# Patient Record
Sex: Female | Born: 1975
Health system: Southern US, Community
[De-identification: ages and names within clinical notes are randomized; demographics above are authoritative.]

## PROBLEM LIST (undated history)

## (undated) DIAGNOSIS — G971 Other reaction to spinal and lumbar puncture: Secondary | ICD-10-CM

## (undated) DIAGNOSIS — M549 Dorsalgia, unspecified: Secondary | ICD-10-CM

## (undated) DIAGNOSIS — R002 Palpitations: Secondary | ICD-10-CM

## (undated) DIAGNOSIS — Z9889 Other specified postprocedural states: Secondary | ICD-10-CM

## (undated) DIAGNOSIS — K219 Gastro-esophageal reflux disease without esophagitis: Secondary | ICD-10-CM

## (undated) DIAGNOSIS — E119 Type 2 diabetes mellitus without complications: Secondary | ICD-10-CM

## (undated) DIAGNOSIS — N200 Calculus of kidney: Secondary | ICD-10-CM

## (undated) DIAGNOSIS — R112 Nausea with vomiting, unspecified: Secondary | ICD-10-CM

## (undated) HISTORY — PX: OTHER SURGICAL HISTORY: SHX169

## (undated) HISTORY — PX: WISDOM TOOTH EXTRACTION: SHX21

## (undated) HISTORY — DX: Gastro-esophageal reflux disease without esophagitis: K21.9

## (undated) HISTORY — DX: Palpitations: R00.2

## (undated) HISTORY — DX: Type 2 diabetes mellitus without complications: E11.9

---

## 1997-07-31 ENCOUNTER — Emergency Department (HOSPITAL_COMMUNITY): Admission: EM | Admit: 1997-07-31 | Discharge: 1997-07-31 | Payer: Self-pay | Admitting: *Deleted

## 1999-04-22 ENCOUNTER — Emergency Department (HOSPITAL_COMMUNITY): Admission: EM | Admit: 1999-04-22 | Discharge: 1999-04-22 | Payer: Self-pay | Admitting: Emergency Medicine

## 1999-04-22 ENCOUNTER — Encounter: Payer: Self-pay | Admitting: Internal Medicine

## 2000-02-15 ENCOUNTER — Ambulatory Visit (HOSPITAL_COMMUNITY): Admission: RE | Admit: 2000-02-15 | Discharge: 2000-02-15 | Payer: Self-pay | Admitting: Obstetrics and Gynecology

## 2000-02-15 ENCOUNTER — Encounter: Payer: Self-pay | Admitting: Obstetrics and Gynecology

## 2000-03-16 ENCOUNTER — Emergency Department (HOSPITAL_COMMUNITY): Admission: EM | Admit: 2000-03-16 | Discharge: 2000-03-16 | Payer: Self-pay

## 2000-04-26 ENCOUNTER — Emergency Department (HOSPITAL_COMMUNITY): Admission: EM | Admit: 2000-04-26 | Discharge: 2000-04-27 | Payer: Self-pay

## 2000-04-26 ENCOUNTER — Encounter: Payer: Self-pay | Admitting: *Deleted

## 2001-02-09 ENCOUNTER — Ambulatory Visit (HOSPITAL_COMMUNITY): Admission: RE | Admit: 2001-02-09 | Discharge: 2001-02-09 | Payer: Self-pay | Admitting: Obstetrics and Gynecology

## 2001-02-09 ENCOUNTER — Encounter: Payer: Self-pay | Admitting: Obstetrics and Gynecology

## 2001-03-21 ENCOUNTER — Ambulatory Visit (HOSPITAL_COMMUNITY): Admission: RE | Admit: 2001-03-21 | Discharge: 2001-03-21 | Payer: Self-pay | Admitting: Family Medicine

## 2001-03-21 ENCOUNTER — Encounter: Payer: Self-pay | Admitting: Family Medicine

## 2001-04-21 ENCOUNTER — Ambulatory Visit (HOSPITAL_COMMUNITY): Admission: RE | Admit: 2001-04-21 | Discharge: 2001-04-21 | Payer: Self-pay | Admitting: Gastroenterology

## 2001-04-21 ENCOUNTER — Encounter: Payer: Self-pay | Admitting: Gastroenterology

## 2001-06-02 ENCOUNTER — Ambulatory Visit (HOSPITAL_COMMUNITY): Admission: RE | Admit: 2001-06-02 | Discharge: 2001-06-02 | Payer: Self-pay | Admitting: Obstetrics and Gynecology

## 2001-06-17 ENCOUNTER — Other Ambulatory Visit: Admission: RE | Admit: 2001-06-17 | Discharge: 2001-06-17 | Payer: Self-pay | Admitting: Obstetrics and Gynecology

## 2002-07-14 ENCOUNTER — Other Ambulatory Visit: Admission: RE | Admit: 2002-07-14 | Discharge: 2002-07-14 | Payer: Self-pay | Admitting: Obstetrics and Gynecology

## 2003-10-05 ENCOUNTER — Other Ambulatory Visit: Admission: RE | Admit: 2003-10-05 | Discharge: 2003-10-05 | Payer: Self-pay | Admitting: Obstetrics and Gynecology

## 2003-12-16 ENCOUNTER — Ambulatory Visit (HOSPITAL_COMMUNITY): Admission: RE | Admit: 2003-12-16 | Discharge: 2003-12-16 | Payer: Self-pay | Admitting: Obstetrics and Gynecology

## 2004-02-19 ENCOUNTER — Inpatient Hospital Stay (HOSPITAL_COMMUNITY): Admission: AD | Admit: 2004-02-19 | Discharge: 2004-02-20 | Payer: Self-pay | Admitting: Obstetrics and Gynecology

## 2004-02-27 ENCOUNTER — Ambulatory Visit (HOSPITAL_COMMUNITY): Admission: RE | Admit: 2004-02-27 | Discharge: 2004-02-27 | Payer: Self-pay | Admitting: Obstetrics and Gynecology

## 2004-03-05 ENCOUNTER — Inpatient Hospital Stay (HOSPITAL_COMMUNITY): Admission: AD | Admit: 2004-03-05 | Discharge: 2004-03-05 | Payer: Self-pay | Admitting: Obstetrics and Gynecology

## 2004-04-03 ENCOUNTER — Ambulatory Visit (HOSPITAL_COMMUNITY): Admission: RE | Admit: 2004-04-03 | Discharge: 2004-04-03 | Payer: Self-pay | Admitting: Obstetrics and Gynecology

## 2004-04-12 ENCOUNTER — Inpatient Hospital Stay (HOSPITAL_COMMUNITY): Admission: RE | Admit: 2004-04-12 | Discharge: 2004-04-15 | Payer: Self-pay | Admitting: Obstetrics and Gynecology

## 2004-04-16 ENCOUNTER — Encounter: Admission: RE | Admit: 2004-04-16 | Discharge: 2004-05-16 | Payer: Self-pay | Admitting: Obstetrics and Gynecology

## 2004-05-15 ENCOUNTER — Emergency Department (HOSPITAL_COMMUNITY): Admission: EM | Admit: 2004-05-15 | Discharge: 2004-05-15 | Payer: Self-pay | Admitting: *Deleted

## 2004-05-17 ENCOUNTER — Encounter: Admission: RE | Admit: 2004-05-17 | Discharge: 2004-06-16 | Payer: Self-pay | Admitting: Obstetrics and Gynecology

## 2004-05-25 ENCOUNTER — Encounter: Admission: RE | Admit: 2004-05-25 | Discharge: 2004-05-25 | Payer: Self-pay | Admitting: Internal Medicine

## 2004-07-15 ENCOUNTER — Encounter: Admission: RE | Admit: 2004-07-15 | Discharge: 2004-07-18 | Payer: Self-pay | Admitting: Obstetrics and Gynecology

## 2004-12-07 ENCOUNTER — Other Ambulatory Visit: Admission: RE | Admit: 2004-12-07 | Discharge: 2004-12-07 | Payer: Self-pay | Admitting: Obstetrics and Gynecology

## 2005-01-16 ENCOUNTER — Emergency Department (HOSPITAL_COMMUNITY): Admission: EM | Admit: 2005-01-16 | Discharge: 2005-01-16 | Payer: Self-pay | Admitting: Family Medicine

## 2005-06-26 ENCOUNTER — Emergency Department (HOSPITAL_COMMUNITY): Admission: EM | Admit: 2005-06-26 | Discharge: 2005-06-26 | Payer: Self-pay | Admitting: Family Medicine

## 2005-08-01 ENCOUNTER — Emergency Department (HOSPITAL_COMMUNITY): Admission: EM | Admit: 2005-08-01 | Discharge: 2005-08-01 | Payer: Self-pay | Admitting: Family Medicine

## 2005-10-20 ENCOUNTER — Emergency Department (HOSPITAL_COMMUNITY): Admission: EM | Admit: 2005-10-20 | Discharge: 2005-10-20 | Payer: Self-pay | Admitting: *Deleted

## 2005-10-23 ENCOUNTER — Ambulatory Visit: Payer: Self-pay | Admitting: Cardiology

## 2005-11-04 ENCOUNTER — Ambulatory Visit: Payer: Self-pay

## 2005-11-04 ENCOUNTER — Encounter: Payer: Self-pay | Admitting: Internal Medicine

## 2005-11-04 ENCOUNTER — Ambulatory Visit: Payer: Self-pay | Admitting: Cardiology

## 2005-11-25 ENCOUNTER — Ambulatory Visit: Payer: Self-pay | Admitting: Cardiology

## 2005-12-23 ENCOUNTER — Other Ambulatory Visit: Admission: RE | Admit: 2005-12-23 | Discharge: 2005-12-23 | Payer: Self-pay | Admitting: Obstetrics and Gynecology

## 2006-03-23 ENCOUNTER — Emergency Department (HOSPITAL_COMMUNITY): Admission: AD | Admit: 2006-03-23 | Discharge: 2006-03-23 | Payer: Self-pay | Admitting: Family Medicine

## 2006-04-25 ENCOUNTER — Ambulatory Visit: Payer: Self-pay | Admitting: Cardiology

## 2006-05-25 ENCOUNTER — Emergency Department (HOSPITAL_COMMUNITY): Admission: EM | Admit: 2006-05-25 | Discharge: 2006-05-25 | Payer: Self-pay | Admitting: Emergency Medicine

## 2007-03-24 ENCOUNTER — Inpatient Hospital Stay (HOSPITAL_COMMUNITY): Admission: RE | Admit: 2007-03-24 | Discharge: 2007-03-24 | Payer: Self-pay | Admitting: Obstetrics and Gynecology

## 2007-04-04 ENCOUNTER — Inpatient Hospital Stay (HOSPITAL_COMMUNITY): Admission: RE | Admit: 2007-04-04 | Discharge: 2007-04-07 | Payer: Self-pay | Admitting: Obstetrics and Gynecology

## 2007-04-08 ENCOUNTER — Encounter (HOSPITAL_COMMUNITY): Admission: RE | Admit: 2007-04-08 | Discharge: 2007-05-08 | Payer: Self-pay | Admitting: Obstetrics and Gynecology

## 2007-05-09 ENCOUNTER — Encounter: Admission: RE | Admit: 2007-05-09 | Discharge: 2007-06-08 | Payer: Self-pay | Admitting: Obstetrics and Gynecology

## 2007-06-09 ENCOUNTER — Encounter: Admission: RE | Admit: 2007-06-09 | Discharge: 2007-07-06 | Payer: Self-pay | Admitting: Obstetrics and Gynecology

## 2007-07-07 ENCOUNTER — Encounter: Admission: RE | Admit: 2007-07-07 | Discharge: 2007-07-09 | Payer: Self-pay | Admitting: Obstetrics and Gynecology

## 2007-08-03 ENCOUNTER — Emergency Department (HOSPITAL_COMMUNITY): Admission: EM | Admit: 2007-08-03 | Discharge: 2007-08-03 | Payer: Self-pay | Admitting: Emergency Medicine

## 2007-12-05 ENCOUNTER — Emergency Department (HOSPITAL_COMMUNITY): Admission: EM | Admit: 2007-12-05 | Discharge: 2007-12-05 | Payer: Self-pay | Admitting: Emergency Medicine

## 2008-05-15 ENCOUNTER — Emergency Department (HOSPITAL_COMMUNITY): Admission: EM | Admit: 2008-05-15 | Discharge: 2008-05-15 | Payer: Self-pay | Admitting: Emergency Medicine

## 2008-06-03 ENCOUNTER — Ambulatory Visit (HOSPITAL_COMMUNITY): Admission: RE | Admit: 2008-06-03 | Discharge: 2008-06-03 | Payer: Self-pay | Admitting: Occupational Medicine

## 2008-06-20 ENCOUNTER — Encounter: Admission: RE | Admit: 2008-06-20 | Discharge: 2008-09-18 | Payer: Self-pay | Admitting: Occupational Medicine

## 2008-10-12 ENCOUNTER — Encounter (INDEPENDENT_AMBULATORY_CARE_PROVIDER_SITE_OTHER): Payer: Self-pay | Admitting: *Deleted

## 2008-12-09 ENCOUNTER — Emergency Department (HOSPITAL_COMMUNITY): Admission: EM | Admit: 2008-12-09 | Discharge: 2008-12-09 | Payer: Self-pay | Admitting: Family Medicine

## 2009-03-14 ENCOUNTER — Emergency Department (HOSPITAL_COMMUNITY): Admission: EM | Admit: 2009-03-14 | Discharge: 2009-03-14 | Payer: Self-pay | Admitting: Family Medicine

## 2009-03-28 ENCOUNTER — Emergency Department (HOSPITAL_COMMUNITY): Admission: EM | Admit: 2009-03-28 | Discharge: 2009-03-28 | Payer: Self-pay | Admitting: Emergency Medicine

## 2010-02-06 ENCOUNTER — Emergency Department (HOSPITAL_COMMUNITY): Admission: EM | Admit: 2010-02-06 | Discharge: 2010-02-06 | Payer: Self-pay | Admitting: Emergency Medicine

## 2010-06-27 LAB — CBC
HCT: 39.7 % (ref 36.0–46.0)
Hemoglobin: 13.7 g/dL (ref 12.0–15.0)
MCH: 28.6 pg (ref 26.0–34.0)
MCHC: 34.5 g/dL (ref 30.0–36.0)
MCV: 82.9 fL (ref 78.0–100.0)
Platelets: 199 10*3/uL (ref 150–400)
RBC: 4.79 MIL/uL (ref 3.87–5.11)
RDW: 13.7 % (ref 11.5–15.5)
WBC: 7.3 10*3/uL (ref 4.0–10.5)

## 2010-06-27 LAB — DIFFERENTIAL
Basophils Absolute: 0 10*3/uL (ref 0.0–0.1)
Basophils Relative: 0 % (ref 0–1)
Eosinophils Absolute: 0 10*3/uL (ref 0.0–0.7)
Eosinophils Relative: 0 % (ref 0–5)
Lymphocytes Relative: 24 % (ref 12–46)
Lymphs Abs: 1.7 10*3/uL (ref 0.7–4.0)
Monocytes Absolute: 0.7 10*3/uL (ref 0.1–1.0)
Monocytes Relative: 10 % (ref 3–12)
Neutro Abs: 4.8 10*3/uL (ref 1.7–7.7)
Neutrophils Relative %: 66 % (ref 43–77)

## 2010-06-27 LAB — POCT RAPID STREP A (OFFICE): Streptococcus, Group A Screen (Direct): NEGATIVE

## 2010-08-28 NOTE — Op Note (Signed)
[name], [name]                 ACCOUNT NO.:  [id number]   MEDICAL RECORD NO.:  [id number]          PATIENT TYPE:  INP   LOCATION:  9103                          FACILITY:  [facility]   PHYSICIAN:  Huel Cote, M.D. DATE OF BIRTH:  10/24/1975   DATE OF PROCEDURE:  04/05/2007  DATE OF DISCHARGE:                               OPERATIVE REPORT   PREOPERATIVE DIAGNOSES:  1. Previous cesarean section, declines vaginal birth after cesarean      delivery.  2. Thirty-nine-week gestation.  3. Mild hypertension.  4. Back pain.   POSTOPERATIVE DIAGNOSES:  1. Previous cesarean section, declines vaginal birth after cesarean      delivery.  2. Thirty-nine-week gestation.  3. Mild hypertension.  4. Back pain.  5. Very thin lower uterine segment, which appeared to be more      consistent with a window of peritoneum than a true muscular layer.   PROCEDURE:  Repeat low transverse cesarean section with double-layer  closure of the uterus.   SURGEON:  Huel Cote, M.D.   ASSISTANT:  None.   ANESTHESIA:  Spinal.   FINDINGS:  There was a vigorous female infant in the vertex presentation.  Weight was 7 pounds 8 ounces.  Apgars were 9 and 9.  There was a double  nuchal cord noted and reduced.  Ovaries and tubes were normal.  There  were some adhesions of the anterior uterine fundus to the anterior  abdominal wall, which were taken down at the time of C-section.  Placenta was sent to labor and delivery.   BLOOD LOSS:  750 mL.   URINE OUTPUT:  150 mL clear urine.   IV FLUIDS:  1300 mL LR.   PROCEDURE:  The patient was taken to the operating room, where spinal  anesthesia was obtained without difficulty.  She was then prepped and  draped in the normal sterile fashion in the dorsal supine position with  a leftward tilt.  A Pfannenstiel skin incision was then made through a  preexisting scar and carried down to the underlying layer of fascia by  sharp dissection and Bovie cautery.  The  fascia was then opened in the  midline and the incision extended laterally with Mayo scissors and the  inferior aspect of fascia was then grasped with Kocher clamps and  dissected off the underlying rectus muscles.  In a similar fashion the  superior aspect was dissected off the rectus muscles.  The rectus  muscles were separated in the midline and the peritoneal cavity was  entered bluntly.  There was one dominant adhesion band coming from the  anterior abdominal wall to the fundus, trailing down towards the  bladder.  This was taken down close to the uterus in order to allow  visualization of the lower uterine segment.  Once this was taken, down  the peritoneal incision was extended bluntly and the Alexis wound  retractor placed within the abdomen.  The lower uterine segment was then  exposed nicely and, as stated, there was a very thin layer which was  almost transparent and it appeared that a small  window was developing in  the lower uterine segment.  For this reason, I did advise the patient  that would not recommend a trial of VBAC should she get pregnant again.  This was easily opened and clear fluid noted.  This infant's head was  then delivered up to the incision with fundal pressure.  It was not  easily forthcoming; therefore, the vacuum was placed on the infant's  vertex and easily delivered from the incision.  Nose and mouth were bulb-  suctioned and the remainder of the body was delivered after a double  nuchal cord was reduced x2.  The infant's cord was clamped and cut was  handed off to the awaiting pediatricians, very vigorous.  The placenta  was then delivered from the uterine cavity manually and by expression  and the uterus cleared of all clots and debris.  The uterine incision  was then carefully inspected and grasped at each angle with ring  forceps.  The lower uterine segment and had been very thin and so it was  grabbed slightly lower down in order to reapproximate  stronger tissue,  but well away from the bladder.  This was then closed with a first layer  of a running locked suture of 0 chromic and the second an imbricating  layer of the same suture.  There was one small area of bleeding at the  right angle, which was treated with an additional figure-of-eight suture  of 0 chromic.  At this point no active bleeding was noted, and the  abdomen and pelvis were irrigated.  The ovaries and tubes were inspected  and found to be normal.  The incision was carefully inspected for  several minutes and no active bleeding noted.  Therefore, all  instruments and sponges were removed from the abdomen as well as the  retractor.  The subfascial planes were inspected and any areas of oozing  were treated with Bovie cautery.  The rectus muscles themselves were  reapproximated with 0 Vicryl in a mattress-like suture, given that the  patient's intestines and omentum were quite protruding through the  incision, with her intermittent nausea.  The fascia was then closed with  0 Vicryl in a running fashion, the subcutaneous tissue was  reapproximated with 3-0 plain in a subcutaneous running suture, and the  skin was closed with staples.  Sponge, lap and needle counts were  correct x2 and the patient was taken to the recovery room in stable  condition      Huel Cote, M.D.  Electronically Signed     KR/MEDQ  D:  04/04/2007  T:  04/06/2007  Job:  [id number]

## 2010-08-28 NOTE — H&P (Signed)
[name], [name]                 ACCOUNT NO.:  [id number]   MEDICAL RECORD NO.:  [id number]          PATIENT TYPE:  INP   LOCATION:  NA                            FACILITY:  [facility]   PHYSICIAN:  Huel Cote, M.D. DATE OF BIRTH:  10/23/1975   DATE OF ADMISSION:  04/04/2007  DATE OF DISCHARGE:                              HISTORY & PHYSICAL   PRIORITY PREOPERATIVE ADMISSION HISTORY AND PHYSICAL   Patient is a 35 year old, G2, P1-0-0-1, who is coming in at essentially  [redacted] weeks gestation with an EDD of December 27th to December 30th by  ultrasound or LMP and a request for a repeat cesarean section without a  trial of labor.  Prenatal care had been complicated only by the previous  C-section and some increasing pain in her lower back and incision area.   PAST OBSTETRIC HISTORY:  In December of 2005, the patient had a C-  section at 38+ weeks for an 8 pound 12 ounce infant, low transverse in  nature.   PAST GYNECOLOGIC HISTORY:  She had a laparoscopy in 2003 for some  adhesions.  No abnormal Pap smears.   PAST MEDICAL HISTORY:  1. History of reflux disease.  2. History of supraventricular tachycardia, which is benign in nature.  3. History of mild depression, however, has not required medications.   PAST SURGICAL HISTORY:  1. Laparoscopy in 2003 for some pelvic adhesions.  2. Left knee arthroscopy in 1992.   ALLERGIES:  1. ERYTHROMYCIN.  2. ULTRAM.  3. TORADOL.   She is currently on her prenatal vitamins.   PRENATAL LABORATORY DATA:  O-positive, antibody negative, RPR  nonreactive, Rubella immune, hepatitis B surface antigen negative, HIV  negative, GC negative, Chlamydia negative, Group B Strep negative, one-  hour Glucola was increased at 146.  With her history of a larger baby,  she did have a three-hour Glucola, which was normal at 92, 174, 151, and  89.  She had been carefully followed throughout her third trimester and  did have a noted sub-size greater than dates  on her fundal height.  Her  last ultrasound was performed in her second trimester and there was  normal growth noted at that point.  She decided that she was going to  have a repeat C-section irregardless of fetal weight so she did not have  a repeat ultrasound for growth after that point.   PHYSICAL EXAMINATION:  VITAL SIGNS:  Her blood pressure is 150/70,  weight is 280 pounds.  CARDIAC EXAM:  Regular rate and rhythm.  LUNGS:  Clear.  ABDOMEN:  Soft and nontender and gravid.  PELVIC EXAM:  The cervix is long and closed.  EXTREMITIES:  She had some 1+ edema in her legs; however, no other signs  of preeclampsia.   All risks and benefits of surgery were discussed with the patient in  detail including bleeding, infection, and possible damage to bowel and  bladder.  She understands the risks of repeat C-section versus a trial  of labor, and given her unfavorable status and a larger baby apparently  again, she declines  a trial of labor and wishes to proceed with a repeat  C-section at this time.  She has been counseled appropriately, and we  will proceed on the 20th of December at 10 a.m. at the [facility]  facility.      Huel Cote, M.D.  Electronically Signed     KR/MEDQ  D:  04/03/2007  T:  04/03/2007  Job:  [id number]

## 2010-08-28 NOTE — Discharge Summary (Signed)
[name], [name]                 ACCOUNT NO.:  [id number]   MEDICAL RECORD NO.:  [id number]          PATIENT TYPE:  INP   LOCATION:  9103                          FACILITY:  [facility]   PHYSICIAN:  Huel Cote, M.D. DATE OF BIRTH:  08/31/1975   DATE OF ADMISSION:  04/04/2007  DATE OF DISCHARGE:  04/07/2007                               DISCHARGE SUMMARY   DISCHARGE DIAGNOSES:  1. Previous cesarean section, declined trial of labor.  2. Intrauterine pregnancy at 36 weeks' gestation.  3. Mild hypertension.  4. Back pain.  5. Status post repeat low transverse cesarean section with double      layer closure of uterus.   DISCHARGE MEDICATIONS:  1. Motrin 600 mg p.o. every 6 hours.  2. Percocet 1-2 tablets p.o. every 4 hours p.r.n.  3. Zoloft 50 mg p.o. daily.   DISCHARGE FOLLOWUP:  The patient is to follow up in 2 weeks for an  incision check.   HOSPITAL COURSE:  The patient is a 35 year old, G2, P1-0-0-1 who was  admitted for a repeat low transverse C-section given previous C-section  and declined trial of labor.  She had a relatively uncomplicated  prenatal course.  For details of her prenatal history, please see  previously dictated H&P.  She underwent a low transverse C-section and  was delivered a vigorous, female infant from the vertex presentation,  Apgar's 9 and 9, weight was 7 pounds 8 ounces.  There was a double  nuchal cord.  Ovaries and tubes were normal.  At the time of C-section,  it was noted that the lower uterine segment was very, very thin and  almost had a window.  There were some adhesions.  Urine output was good.  Estimated blood loss was 750 mL.  She was then admitted for routine  postop care.  She did well on postop day #1.  Hemoglobin was 9.4.  By  postop day #3, she was ambulating and tolerating a regular diet.  Staples were out and Steri-Strips placed.  The incision looked good.  She was felt to be stable for discharge and was discharged home with  followup  as previously stated.      Huel Cote, M.D.  Electronically Signed     KR/MEDQ  D:  05/27/2007  T:  05/28/2007  Job:  [id number]

## 2010-08-31 NOTE — Assessment & Plan Note (Signed)
[facility]                            CARDIOLOGY OFFICE NOTE   BEULA, JOYNER                        MRN:          [id number]  DATE:04/25/2006                            DOB:          [birth year]    Ms. Mckendry returns for followup today.  She has a history of probable  SVT by history that is not documented by ECG.  Her LV function and TSH  have been normal.  Since I last saw her she has had one brief episode of  palpitations that terminated with Valsalva.  She has not had shortness  of breath, chest pain or syncope.   MEDICATIONS:  Include prenatal vitamins and Zantac.  She discontinued  her Toprol, as she is trying to become pregnant.   PHYSICAL EXAMINATION:  Shows a blood pressure of 126/72 and her pulse is  103.  NECK:  Supple.  CHEST:  Clear.  CARDIOVASCULAR:  Exam reveals a regular rate and rhythm.  EXTREMITIES:  Show no edema.   An electrocardiogram shows a sinus rhythm at a rate of 103.  The axis is  normal.  There are no ST changes noted.   DIAGNOSES:  1. History of palpitations, most likely secondary to supraventricular      tachycardia.  2. Hyperlipidemia.  3. Gastroesophageal reflux disease.   PLAN:  Ms. Presti is doing well from a symptomatic standpoint.  She is  off her beta blockers.  She is trying to become pregnant.  We will  continue to follow her expectantly.  If she has worsening symptoms in  the future, then we could resume her beta blocker.  I have also  instructed her to have an electrocardiogram or rhythm strip performed if  she develops symptoms at work, as she does work at the [employer].  If we can document SVT and it becomes more frequent, then she can be  referred to one of our electrophysiologists for consideration of  ablation.  She will see me back in 1 year.     Madolyn Frieze Jens Som, MD, [facility]  Electronically Signed    BSC/MedQ  DD: 04/25/2006  DT: 04/26/2006  Job #: [id number]   cc:   Marcene Duos, M.D.

## 2011-01-18 LAB — CBC
HCT: 26.7 — ABNORMAL LOW
HCT: 33 — ABNORMAL LOW
Hemoglobin: 11.4 — ABNORMAL LOW
Hemoglobin: 9.4 — ABNORMAL LOW
MCHC: 34.4
MCHC: 35.4
MCV: 86.7
MCV: 87.2
Platelets: 176
Platelets: 216
RBC: 3.07 — ABNORMAL LOW
RBC: 3.79 — ABNORMAL LOW
RDW: 14.5
RDW: 14.7
WBC: 13.1 — ABNORMAL HIGH
WBC: 9

## 2011-01-18 LAB — RPR: RPR Ser Ql: NONREACTIVE

## 2011-07-03 ENCOUNTER — Encounter: Payer: Self-pay | Admitting: Cardiology

## 2011-07-25 ENCOUNTER — Encounter: Payer: Self-pay | Admitting: *Deleted

## 2011-07-26 ENCOUNTER — Encounter: Payer: Self-pay | Admitting: Cardiology

## 2011-07-26 ENCOUNTER — Ambulatory Visit (INDEPENDENT_AMBULATORY_CARE_PROVIDER_SITE_OTHER): Payer: 59 | Admitting: Cardiology

## 2011-07-26 VITALS — BP 118/80 | HR 82 | Ht 67.0 in | Wt 285.0 lb

## 2011-07-26 DIAGNOSIS — R002 Palpitations: Secondary | ICD-10-CM

## 2011-07-26 MED ORDER — METOPROLOL SUCCINATE ER 50 MG PO TB24: ORAL_TABLET | ORAL | Status: DC

## 2011-07-26 NOTE — Assessment & Plan Note (Signed)
The patient had what sounds to be either PACs or PVCs. These symptoms were different than her previous SVT. Her symptoms have improved on Toprol. Her recent TSH normal. Plan repeat echocardiogram. Continue beta blocker. If her symptoms worsen we'll consider CardioNet. Note she has only had 2 brief episodes of what sounds to be SVT in the past 5 years. Toprol should help with the symptoms as well. Followup 1 year.

## 2011-07-26 NOTE — Patient Instructions (Signed)
Your physician has recommended you make the following change in your medication: Decrease Toprol to 25mg  daily  Your physician has requested that you have an echocardiogram. Echocardiography is a painless test that uses sound waves to create images of your heart. It provides your doctor with information about the size and shape of your heart and how well your heart's chambers and valves are working. This procedure takes approximately one hour. There are no restrictions for this procedure.   Your physician wants you to follow-up in: 1 year with Dr. Jens Som. You will receive a reminder letter in the mail two months in advance. If you don't receive a letter, please call our office to schedule the follow-up appointment.

## 2011-07-26 NOTE — Progress Notes (Signed)
  HPI: 36 year-old female for evaluation of palpitations. We have seen her previously for this issue and felt by history she may have SVT although not documented by electrocardiogram or monitor. Echocardiogram in July of 2007 showed normal LV function. TSH was also normal at that time. She did an event monitor but had no palpitations the monitor was in place. Patient did well since that time with only 2 brief episodes of what sounds to be SVT terminated with cough. Approximately one month ago she developed recurrent palpitations described as her heart skipping. This was unlike her previous SVT symptoms. Mild nausea and dizziness but no syncope. She was started on a beta blocker and her symptoms have resolved. She denies dyspnea on exertion, orthopnea, PND, syncope or exertional chest pain. Occasional mild pedal edema.  Current Outpatient Prescriptions  Medication Sig Dispense Refill  . aspirin 81 MG tablet Take 81 mg by mouth daily.      Marland Kitchen escitalopram (LEXAPRO) 20 MG tablet Take 20 mg by mouth daily.      Marland Kitchen esomeprazole (NEXIUM) 40 MG capsule Take 40 mg by mouth daily before breakfast.      . metoprolol succinate (TOPROL-XL) 50 MG 24 hr tablet Take 50 mg by mouth daily. Take with or immediately following a meal.        No Known Allergies  Past Medical History  Diagnosis Date  . GERD (gastroesophageal reflux disease)   . Palpitations     Past Surgical History  Procedure Date  . Cesarean section   . Left knee arthroscopic surgery   . Wisdom tooth extraction     History   Social History  . Marital Status: Divorced    Spouse Name: N/A    Number of Children: 2  . Years of Education: N/A   Occupational History  .  [employer]   Social History Main Topics  . Smoking status: Former Games developer  . Smokeless tobacco: Not on file  . Alcohol Use: No  . Drug Use: No  . Sexually Active: Not on file   Other Topics Concern  . Not on file   Social History Narrative  . No narrative on  file    Family History  Problem Relation Age of Onset  . Hypertension Mother   . Stroke Mother   . Hypertension Father     ROS:  no fevers or chills, productive cough, hemoptysis, dysphasia, odynophagia, melena, hematochezia, dysuria, hematuria, rash, seizure activity, orthopnea, PND, pedal edema, claudication. Remaining systems are negative.  Physical Exam:   Blood pressure 118/80, pulse 82, height 5\' 7"  (1.702 m), weight 285 lb (129.275 kg).  General:  Well developed/well nourished in NAD Skin warm/dry Patient not depressed No peripheral clubbing Back-normal HEENT-normal/normal eyelids Neck supple/normal carotid upstroke bilaterally; no bruits; no JVD; no thyromegaly chest - CTA/ normal expansion CV - RRR/normal S1 and S2; no murmurs, rubs or gallops;  PMI nondisplaced Abdomen -NT/ND, no HSM, no mass, + bowel sounds, no bruit 2+ femoral pulses, no bruits Ext-no edema, chords, 2+ DP Neuro-grossly nonfocal  ECG 07/03/2011-sinus rhythm, no ST changes, no delta wave noted.

## 2011-08-01 ENCOUNTER — Other Ambulatory Visit (HOSPITAL_COMMUNITY): Payer: Self-pay | Admitting: Obstetrics and Gynecology

## 2011-08-01 DIAGNOSIS — Z1231 Encounter for screening mammogram for malignant neoplasm of breast: Secondary | ICD-10-CM

## 2011-08-03 ENCOUNTER — Inpatient Hospital Stay (HOSPITAL_COMMUNITY): Payer: 59

## 2011-08-03 ENCOUNTER — Encounter (HOSPITAL_COMMUNITY): Payer: Self-pay | Admitting: *Deleted

## 2011-08-03 ENCOUNTER — Emergency Department (HOSPITAL_COMMUNITY)
Admission: EM | Admit: 2011-08-03 | Discharge: 2011-08-03 | Disposition: A | Discharge status: Home / Self Care | Payer: 59 | Attending: Emergency Medicine | Admitting: Emergency Medicine

## 2011-08-03 ENCOUNTER — Inpatient Hospital Stay (HOSPITAL_COMMUNITY)
Admission: AD | Admit: 2011-08-03 | Discharge: 2011-08-03 | Disposition: A | Discharge status: Home / Self Care | Payer: 59 | Source: Ambulatory Visit | Attending: Obstetrics and Gynecology | Admitting: Obstetrics and Gynecology

## 2011-08-03 ENCOUNTER — Emergency Department (HOSPITAL_COMMUNITY): Payer: 59

## 2011-08-03 DIAGNOSIS — R110 Nausea: Secondary | ICD-10-CM | POA: Insufficient documentation

## 2011-08-03 DIAGNOSIS — K219 Gastro-esophageal reflux disease without esophagitis: Secondary | ICD-10-CM | POA: Insufficient documentation

## 2011-08-03 DIAGNOSIS — M549 Dorsalgia, unspecified: Secondary | ICD-10-CM | POA: Insufficient documentation

## 2011-08-03 DIAGNOSIS — R509 Fever, unspecified: Secondary | ICD-10-CM | POA: Insufficient documentation

## 2011-08-03 DIAGNOSIS — R109 Unspecified abdominal pain: Secondary | ICD-10-CM

## 2011-08-03 DIAGNOSIS — N949 Unspecified condition associated with female genital organs and menstrual cycle: Secondary | ICD-10-CM | POA: Insufficient documentation

## 2011-08-03 DIAGNOSIS — R102 Pelvic and perineal pain: Secondary | ICD-10-CM

## 2011-08-03 DIAGNOSIS — Z7982 Long term (current) use of aspirin: Secondary | ICD-10-CM | POA: Insufficient documentation

## 2011-08-03 DIAGNOSIS — Z79899 Other long term (current) drug therapy: Secondary | ICD-10-CM | POA: Insufficient documentation

## 2011-08-03 DIAGNOSIS — R319 Hematuria, unspecified: Secondary | ICD-10-CM | POA: Insufficient documentation

## 2011-08-03 DIAGNOSIS — M546 Pain in thoracic spine: Secondary | ICD-10-CM | POA: Insufficient documentation

## 2011-08-03 DIAGNOSIS — Z30431 Encounter for routine checking of intrauterine contraceptive device: Secondary | ICD-10-CM | POA: Insufficient documentation

## 2011-08-03 HISTORY — DX: Other reaction to spinal and lumbar puncture: G97.1

## 2011-08-03 HISTORY — DX: Nausea with vomiting, unspecified: R11.2

## 2011-08-03 HISTORY — DX: Other specified postprocedural states: Z98.890

## 2011-08-03 LAB — URINALYSIS, ROUTINE W REFLEX MICROSCOPIC
Glucose, UA: NEGATIVE mg/dL
Ketones, ur: NEGATIVE mg/dL
Leukocytes, UA: NEGATIVE
Nitrite: NEGATIVE
Protein, ur: NEGATIVE mg/dL
Specific Gravity, Urine: >1.03 — ABNORMAL HIGH (ref 1.005–1.030)
Urobilinogen, UA: 0.2 mg/dL (ref 0.0–1.0)
pH: 6 (ref 5.0–8.0)

## 2011-08-03 LAB — DIFFERENTIAL
Basophils Absolute: 0 10*3/uL (ref 0.0–0.1)
Basophils Relative: 0 % (ref 0–1)
Eosinophils Absolute: 0 10*3/uL (ref 0.0–0.7)
Lymphs Abs: 1.3 10*3/uL (ref 0.7–4.0)
Neutrophils Relative %: 67 % (ref 43–77)

## 2011-08-03 LAB — CBC
MCH: 28 pg (ref 26.0–34.0)
Platelets: 196 10*3/uL (ref 150–400)
RBC: 4.82 MIL/uL (ref 3.87–5.11)
RDW: 13.6 % (ref 11.5–15.5)
WBC: 6.2 10*3/uL (ref 4.0–10.5)

## 2011-08-03 LAB — BASIC METABOLIC PANEL
Calcium: 8.7 mg/dL (ref 8.4–10.5)
GFR calc Af Amer: >90 mL/min (ref >90)
GFR calc non Af Amer: >90 mL/min (ref >90)
Glucose, Bld: 94 mg/dL (ref 70–99)
Potassium: 3.1 mEq/L — ABNORMAL LOW (ref 3.5–5.1)
Sodium: 136 mEq/L (ref 135–145)

## 2011-08-03 LAB — PREGNANCY, URINE: Preg Test, Ur: NEGATIVE

## 2011-08-03 LAB — URINE MICROSCOPIC-ADD ON

## 2011-08-03 MED ORDER — HYDROMORPHONE HCL PF 2 MG/ML IJ SOLN
2.0000 mg | Freq: Once | INTRAMUSCULAR | Status: AC
  Administered 2011-08-03: 2 mg via INTRAVENOUS
  Filled 2011-08-03: qty 1

## 2011-08-03 MED ORDER — OXYCODONE-ACETAMINOPHEN 5-325 MG PO TABS: ORAL_TABLET | ORAL | Status: DC

## 2011-08-03 MED ORDER — ONDANSETRON HCL 4 MG/2ML IJ SOLN
4.0000 mg | Freq: Once | INTRAMUSCULAR | Status: AC
  Administered 2011-08-03: 4 mg via INTRAVENOUS
  Filled 2011-08-03: qty 2

## 2011-08-03 MED ORDER — ONDANSETRON HCL 4 MG/2ML IJ SOLN: 4.0000 mg | Freq: Once | INTRAMUSCULAR | Status: DC

## 2011-08-03 MED ORDER — SODIUM CHLORIDE 0.9 % IV SOLN
INTRAVENOUS | Status: DC
  Administered 2011-08-03: 10:00:00 via INTRAVENOUS

## 2011-08-03 NOTE — MAU Provider Note (Signed)
History     CSN: [id number]  Arrival date & time 08/03/11  1542   None 1635 Pt not in the room.    Chief Complaint  Patient presents with  . Back Pain  . Abdominal Pain    (Consider location/radiation/quality/duration/timing/severity/associated sxs/prior treatment) HPITisa [name] is a 36 y.o. [id number]. She has had nausea and low back pain all week,had diarrhea 4/18 pm. She  was evaluated by her PCP 4/19, again this am. Her nausea is better, no diarrhea, the pain radiates up her back to her head giving her a ha and radiates  To RLQ. Her PCP sent her  to [facility] ED for fluids, pain med, labs and CT. She had adnexal pain with bimanual, came here for pelvic U/S.  No recent sexual contact, no change in discharge, odor or itching. No UTI S&S.  Past Medical History  Diagnosis Date  . GERD (gastroesophageal reflux disease)   . Palpitations   . PONV (postoperative nausea and vomiting)   . Spinal headache     Past Surgical History  Procedure Date  . Cesarean section   . Left knee arthroscopic surgery   . Wisdom tooth extraction     Family History  Problem Relation Age of Onset  . Hypertension Mother   . Stroke Mother   . Anesthesia problems Mother   . Hypertension Father   . Anesthesia problems Sister     History  Substance Use Topics  . Smoking status: Former Smoker    Quit date: 08/03/2002  . Smokeless tobacco: Not on file  . Alcohol Use: No    OB History    Grav Para Term Preterm Abortions TAB SAB Ect Mult Living   2 2 2       2       Review of Systems  Constitutional: Negative for fever and chills.  Genitourinary: Positive for pelvic pain. Negative for dysuria, urgency, frequency, vaginal bleeding, vaginal discharge and vaginal pain.    Allergies  Review of patient's allergies indicates no known allergies.  Home Medications  No current outpatient prescriptions on file.  There were no vitals taken for this visit.  Physical Exam  Vitals  reviewed. Constitutional: She appears well-developed and well-nourished.  Musculoskeletal: Normal range of motion.  Neurological: She is alert.  Psychiatric: She has a normal mood and affect. Her behavior is normal.    ED Course  Procedures (including critical care time)  Labs Reviewed - No data to display Ct Abdomen Pelvis Wo Contrast  08/03/2011  *RADIOLOGY REPORT*  Clinical Data: Right-sided abdominal/flank pain, hematuria  CT ABDOMEN AND PELVIS WITHOUT CONTRAST  Technique:  Multidetector CT imaging of the abdomen and pelvis was performed following the standard protocol without intravenous contrast.  Comparison: Abdominal ultrasound - 04/03/2004  Findings:  The lack of intravenous contrast limits the ability to evaluate solid abdominal organs.  Normal hepatic contour.  Normal noncontrast appearance of the gallbladder.  No ascites.  Normal noncontrast appearance of the bilateral kidneys.  No renal stones or urinary obstruction.  No perinephric stranding.  Normal noncontrast appearance of the bilateral adrenal glands, pancreas and spleen. Incidental note is made of a small splenule.  Scattered minimal colonic diverticulosis without evidence of diverticulitis on this noncontrast examination.  The bowel is otherwise normal in course and caliber without wall thickening or evidence of obstruction.  Normal noncontrast appearance of the appendix (coronal images 45 through 54).  No pneumoperitoneum, pneumatosis or portal venous gas.  The normal caliber of the abdominal  aorta.  No retroperitoneal, mesenteric, pelvic or inguinal lymphadenopathy.  An intrauterine device is seen within the uterus.  No discrete adnexal mass.  No free fluid within the pelvis.  Limited visualization of the lower thorax is negative of focal airspace opacity or pleural effusion.  There is minimal dependent atelectasis within the left lower lobe.  Normal heart size.  No pericardial effusion.  No acute or aggressive osseous  abnormalities.  IMPRESSION: 1.  No explanation for patient's right-sided flank pain. Specifically, no evidence of renal stones or urinary obstruction. 2.  Normal noncontrast appearance of the appendix.  Original Report Authenticated By: Waynard Reeds, M.D.     No diagnosis found.  Results for orders placed during the hospital encounter of 08/03/11 (from the past 24 hour(s))  CBC     Status: Normal   Collection Time   08/03/11  9:07 AM      Component Value Range   WBC 6.2  4.0 - 10.5 (K/uL)   RBC 4.82  3.87 - 5.11 (MIL/uL)   Hemoglobin 13.5  12.0 - 15.0 (g/dL)   HCT 62.9  52.8 - 41.3 (%)   MCV 83.0  78.0 - 100.0 (fL)   MCH 28.0  26.0 - 34.0 (pg)   MCHC 33.8  30.0 - 36.0 (g/dL)   RDW 24.4  01.0 - 27.2 (%)   Platelets 196  150 - 400 (K/uL)  DIFFERENTIAL     Status: Normal   Collection Time   08/03/11  9:07 AM      Component Value Range   Neutrophils Relative 67  43 - 77 (%)   Neutro Abs 4.2  1.7 - 7.7 (K/uL)   Lymphocytes Relative 21  12 - 46 (%)   Lymphs Abs 1.3  0.7 - 4.0 (K/uL)   Monocytes Relative 11  3 - 12 (%)   Monocytes Absolute 0.7  0.1 - 1.0 (K/uL)   Eosinophils Relative 1  0 - 5 (%)   Eosinophils Absolute 0.0  0.0 - 0.7 (K/uL)   Basophils Relative 0  0 - 1 (%)   Basophils Absolute 0.0  0.0 - 0.1 (K/uL)  BASIC METABOLIC PANEL     Status: Abnormal   Collection Time   08/03/11  9:07 AM      Component Value Range   Sodium 136  135 - 145 (mEq/L)   Potassium 3.1 (*) 3.5 - 5.1 (mEq/L)   Chloride 101  96 - 112 (mEq/L)   CO2 23  19 - 32 (mEq/L)   Glucose, Bld 94  70 - 99 (mg/dL)   BUN 10  6 - 23 (mg/dL)   Creatinine, Ser 5.36  0.50 - 1.10 (mg/dL)   Calcium 8.7  8.4 - 64.4 (mg/dL)   GFR calc non Af Amer >90  >90 (mL/min)   GFR calc Af Amer >90  >90 (mL/min)  URINALYSIS, ROUTINE W REFLEX MICROSCOPIC     Status: Abnormal   Collection Time   08/03/11  9:10 AM      Component Value Range   Color, Urine YELLOW  YELLOW    APPearance CLEAR  CLEAR    Specific Gravity, Urine  >1.030 (*) 1.005 - 1.030    pH 6.0  5.0 - 8.0    Glucose, UA NEGATIVE  NEGATIVE (mg/dL)   Hgb urine dipstick SMALL (*) NEGATIVE    Bilirubin Urine MODERATE (*) NEGATIVE    Ketones, ur NEGATIVE  NEGATIVE (mg/dL)   Protein, ur NEGATIVE  NEGATIVE (mg/dL)   Urobilinogen,  UA 0.2  0.0 - 1.0 (mg/dL)   Nitrite NEGATIVE  NEGATIVE    Leukocytes, UA NEGATIVE  NEGATIVE   PREGNANCY, URINE     Status: Normal   Collection Time   08/03/11  9:10 AM      Component Value Range   Preg Test, Ur NEGATIVE  NEGATIVE   URINE MICROSCOPIC-ADD ON     Status: Normal   Collection Time   08/03/11  9:10 AM      Component Value Range   WBC, UA 0-2  <3 (WBC/hpf)   RBC / HPF 0-2  <3 (RBC/hpf)  WET PREP, GENITAL     Status: Abnormal   Collection Time   08/03/11 12:50 PM      Component Value Range   Yeast Wet Prep HPF POC NONE SEEN  NONE SEEN    Trich, Wet Prep NONE SEEN  NONE SEEN    Clue Cells Wet Prep HPF POC NONE SEEN  NONE SEEN    WBC, Wet Prep HPF POC FEW (*) NONE SEEN     U/S  Here-No uterine mass,IUD within endometrial canal, the R distal arm of the IUD may be just within the cornual myometrium or corneal tube. Significance not known. No ovaries.  ASSESSMENT:  Pelvic pain, ? Related to IUD  PLAN:  Pt has Zofran and Percocet for home use. She will go home and monitor her symptoms.  If pain increases to call the office for f/u Pt is in agreement with this plan of care. Consulted with Dr Senaida Ores.    MDM

## 2011-08-03 NOTE — Discharge Instructions (Signed)
Back Pain, Adult Low back pain is very common. About 1 in 5 people have back pain.The cause of low back pain is rarely dangerous. The pain often gets better over time.About half of people with a sudden onset of back pain feel better in just 2 weeks. About 8 in 10 people feel better by 6 weeks.  CAUSES Some common causes of back pain include:  Strain of the muscles or ligaments supporting the spine.   Wear and tear (degeneration) of the spinal discs.   Arthritis.   Direct injury to the back.  DIAGNOSIS Most of the time, the direct cause of low back pain is not known.However, back pain can be treated effectively even when the exact cause of the pain is unknown.Answering your caregiver's questions about your overall health and symptoms is one of the most accurate ways to make sure the cause of your pain is not dangerous. If your caregiver needs more information, he or she may order lab work or imaging tests (X-rays or MRIs).However, even if imaging tests show changes in your back, this usually does not require surgery. HOME CARE INSTRUCTIONS For many people, back pain returns.Since low back pain is rarely dangerous, it is often a condition that people can learn to manageon their own.   Remain active. It is stressful on the back to sit or stand in one place. Do not sit, drive, or stand in one place for more than 30 minutes at a time. Take short walks on level surfaces as soon as pain allows.Try to increase the length of time you walk each day.   Do not stay in bed.Resting more than 1 or 2 days can delay your recovery.   Do not avoid exercise or work.Your body is made to move.It is not dangerous to be active, even though your back may hurt.Your back will likely heal faster if you return to being active before your pain is gone.   Pay attention to your body when you bend and lift. Many people have less discomfortwhen lifting if they bend their knees, keep the load close to their  bodies,and avoid twisting. Often, the most comfortable positions are those that put less stress on your recovering back.   Find a comfortable position to sleep. Use a firm mattress and lie on your side with your knees slightly bent. If you lie on your back, put a pillow under your knees.   Only take over-the-counter or prescription medicines as directed by your caregiver. Over-the-counter medicines to reduce pain and inflammation are often the most helpful.Your caregiver may prescribe muscle relaxant drugs.These medicines help dull your pain so you can more quickly return to your normal activities and healthy exercise.   Put ice on the injured area.   Put ice in a plastic bag.   Place a towel between your skin and the bag.   Leave the ice on for 15 to 20 minutes, 3 to 4 times a day for the first 2 to 3 days. After that, ice and heat may be alternated to reduce pain and spasms.   Ask your caregiver about trying back exercises and gentle massage. This may be of some benefit.   Avoid feeling anxious or stressed.Stress increases muscle tension and can worsen back pain.It is important to recognize when you are anxious or stressed and learn ways to manage it.Exercise is a great option.  SEEK MEDICAL CARE IF:  You have pain that is not relieved with rest or medicine.   You have   pain that does not improve in 1 week.   You have new symptoms.   You are generally not feeling well.  SEEK IMMEDIATE MEDICAL CARE IF:   You have pain that radiates from your back into your legs.   You develop new bowel or bladder control problems.   You have unusual weakness or numbness in your arms or legs.   You develop nausea or vomiting.   You develop abdominal pain.   You feel faint.  Document Released: 04/01/2005 Document Revised: 03/21/2011 Document Reviewed: 08/20/2010 [facility] Patient Information 2012 [place], [place].Pelvic Pain Pelvic pain is pain below the belly button and located between  your hips. Acute pain may last a few hours or days. Chronic pelvic pain may last weeks and months. The cause may be different for different types of pain. The pain may be dull or sharp, mild or severe and can interfere with your daily activities. Write down and tell your caregiver:   Exactly where the pain is located.   If it comes and goes or is there all the time.   When it happens (with sex, urination, bowel movement, etc.)   If the pain is related to your menstrual period or stress.  Your caregiver will take a full history and do a complete physical exam and Pap test. CAUSES   Painful menstrual periods (dysmenorrhea).   Normal ovulation (Mittelschmertz) that occurs in the middle of the menstrual cycle every month.   The pelvic organs get engorged with blood just before the menstrual period (pelvic congestive syndrome).   Scar tissue from an infection or past surgery (pelvic adhesions).   Cancer of the female pelvic organs. When there is pain with cancer, it has been there for a long time.   The lining of the uterus (endometrium) abnormally grows in places like the pelvis and on the pelvic organs (endometriosis).   A form of endometriosis with the lining of the uterus present inside of the muscle tissue of the uterus (adenomyosis).   Fibroid tumor (noncancerous) in the uterus.   Bladder problems such as infection, bladder spasms of the muscle tissue of the bladder.   Intestinal problems (irritable bowel syndrome, colitis, an ulcer or gastrointestinal infection).   Polyps of the cervix or uterus.   Pregnancy in the tube (ectopic pregnancy).   The opening of the cervix is too small for the menstrual blood to flow through it (cervical stenosis).   Physical or sexual abuse (past or present).   Musculo-skeletal problems from poor posture, problems with the vertebrae of the lower back or the uterine pelvic muscles falling (prolapse).   Psychological problems such as depression  or stress.   IUD (intrauterine device) in the uterus.  DIAGNOSIS  Tests to make a diagnosis depends on the type, location, severity and what causes the pain to occur. Tests that may be needed include:  Blood tests.   Urine tests   Ultrasound.   X-rays.   CT Scan.   MRI.   Laparoscopy.   Major surgery.  TREATMENT  Treatment will depend on the cause of the pain, which includes:  Prescription or over-the-counter pain medication.   Antibiotics.   Birth control pills.   Hormone treatment.   Nerve blocking injections.   Physical therapy.   Antidepressants.   Counseling with a psychiatrist or psychologist.   Minor or major surgery.  HOME CARE INSTRUCTIONS   Only take over-the-counter or prescription medicines for pain, discomfort or fever as directed by your caregiver.  Follow your caregiver's advice to treat your pain.   Rest.   Avoid sexual intercourse if it causes the pain.   Apply warm or cold compresses (which ever works best) to the pain area.   Do relaxation exercises such as yoga or meditation.   Try acupuncture.   Avoid stressful situations.   Try group therapy.   If the pain is because of a stomach/intestinal upset, drink clear liquids, eat a bland light food diet until the symptoms go away.  SEEK MEDICAL CARE IF:   You need stronger prescription pain medication.   You develop pain with sexual intercourse.   You have pain with urination.   You develop a temperature of 102 F (38.9 C) with the pain.   You are still in pain after 4 hours of taking prescription medication for the pain.   You need depression medication.   Your IUD is causing pain and you want it removed.  SEEK IMMEDIATE MEDICAL CARE IF:  You develop very severe pain or tenderness.   You faint, have chills, severe weakness or dehydration.   You develop heavy vaginal bleeding or passing solid tissue.   You develop a temperature of 102 F (38.9 C) with the pain.    You have blood in the urine.   You are being physically or sexually abused.   You have uncontrolled vomiting and diarrhea.   You are depressed and afraid of harming yourself or someone else.  Document Released: 05/09/2004 Document Revised: 03/21/2011 Document Reviewed: 02/04/2008 [facility] Patient Information 2012 [place], [place].  Take the pain medicine and your zofran as directed.  You have an outpatient pelvic ultrasound scheduled here at [facility] on 08-05-11 at 2:00.  Arrive about 1/2 hr early to get registered.  Have a full bladder when you arrive.  i have asked that the results be called to dr. Phillips Odor.  Call him on Monday for further instruction.

## 2011-08-03 NOTE — ED Notes (Signed)
Report obtained and care assumed

## 2011-08-03 NOTE — ED Provider Notes (Signed)
History     CSN: [id number]  Arrival date & time 08/03/11  0840   First MD Initiated Contact with Patient 08/03/11 [phone]      Chief Complaint  Patient presents with  . Flank Pain  . Abdominal Pain    (Consider location/radiation/quality/duration/timing/severity/associated sxs/prior treatment) HPI Comments:  Pain has been sharp and constant for 2 days.  States she was nauseous and had a fever of 101 a week ago and thought she had a "stomach bug".  No UTI sxs.  No back or groin injury.  On marina birth control.  No menstrual cycle ~ 2 years.  Seen yest and this AM by dr. Sudie Bailey.  Urine both times dipped positive for blood.  No h/o kidney stones.  Patient is a 36 y.o. female presenting with flank pain and abdominal pain. The history is provided by the patient. No language interpreter was used.  Flank Pain This is a new problem. The problem occurs constantly. The problem has been waxing and waning. Associated symptoms include abdominal pain, a fever and nausea. Pertinent negatives include no urinary symptoms or vomiting. The symptoms are aggravated by nothing. Treatments tried: aspirin yest without improvement.   The treatment provided no relief.  Abdominal Pain The primary symptoms of the illness include abdominal pain, fever and nausea. The primary symptoms of the illness do not include vomiting, diarrhea or dysuria.  Additional symptoms associated with the illness include hematuria and back pain. Symptoms associated with the illness do not include urgency or frequency.    Past Medical History  Diagnosis Date  . GERD (gastroesophageal reflux disease)   . Palpitations     Past Surgical History  Procedure Date  . Cesarean section   . Left knee arthroscopic surgery   . Wisdom tooth extraction     Family History  Problem Relation Age of Onset  . Hypertension Mother   . Stroke Mother   . Hypertension Father     History  Substance Use Topics  . Smoking status: Former Games developer    . Smokeless tobacco: Not on file  . Alcohol Use: No    OB History    Grav Para Term Preterm Abortions TAB SAB Ect Mult Living                  Review of Systems  Constitutional: Positive for fever.  Gastrointestinal: Positive for nausea and abdominal pain. Negative for vomiting and diarrhea.  Genitourinary: Positive for hematuria and flank pain. Negative for dysuria, urgency and frequency.  Musculoskeletal: Positive for back pain.  All other systems reviewed and are negative.    Allergies  Review of patient's allergies indicates no known allergies.  Home Medications   Current Outpatient Rx  Name Route Sig Dispense Refill  . ASPIRIN 81 MG PO TABS Oral Take 81 mg by mouth daily.    Marland Kitchen CIPROFLOXACIN HCL 500 MG PO TABS Oral Take 500 mg by mouth 2 (two) times daily. Started on 08/02/11.    Marland Kitchen ESCITALOPRAM OXALATE 20 MG PO TABS Oral Take 20 mg by mouth daily.    Marland Kitchen ESOMEPRAZOLE MAGNESIUM 40 MG PO CPDR Oral Take 40 mg by mouth daily before breakfast.    . LEVONORGESTREL 20 MCG/24HR IU IUD Intrauterine 1 each by Intrauterine route once.    Marland Kitchen METOPROLOL SUCCINATE ER 50 MG PO TB24 Oral Take 25 mg by mouth daily. 1/2 tablet daily    . ONDANSETRON HCL 4 MG PO TABS Oral Take 4 mg by mouth every 8 (  eight) hours as needed. For nausea    . OXYCODONE-ACETAMINOPHEN 5-325 MG PO TABS  One tab po q 4-6 hrs prn pain 20 tablet 0    BP 111/50  Pulse 88  Temp 98.1 F (36.7 C)  Resp 20  Ht 5\' 7"  (1.702 m)  Wt 283 lb (128.368 kg)  BMI 44.32 kg/m2  SpO2 100%  Physical Exam  Nursing note and vitals reviewed. Constitutional: She is oriented to person, place, and time. She appears well-developed and well-nourished. No distress.  HENT:  Head: Normocephalic and atraumatic.  Eyes: EOM are normal.  Neck: Normal range of motion.  Cardiovascular: Normal rate, regular rhythm and normal heart sounds.   Pulmonary/Chest: Effort normal and breath sounds normal.  Abdominal: Soft. She exhibits no distension  and no mass. There is no tenderness. There is no rebound and no guarding.    Genitourinary: Pelvic exam was performed with patient supine. Uterus is not tender. Right adnexum displays tenderness. Left adnexum displays tenderness. No vaginal discharge found.       No CMT.  Bilateral adnexal PT ( R > L).  Musculoskeletal: Normal range of motion.       Arms: Neurological: She is alert and oriented to person, place, and time.  Skin: Skin is warm and dry.  Psychiatric: She has a normal mood and affect. Judgment normal.    ED Course  Procedures (including critical care time)  Labs Reviewed  URINALYSIS, ROUTINE W REFLEX MICROSCOPIC - Abnormal; Notable for the following:    Specific Gravity, Urine >1.030 (*)    Hgb urine dipstick SMALL (*)    Bilirubin Urine MODERATE (*)    All other components within normal limits  BASIC METABOLIC PANEL - Abnormal; Notable for the following:    Potassium 3.1 (*)    All other components within normal limits  WET PREP, GENITAL - Abnormal; Notable for the following:    WBC, Wet Prep HPF POC FEW (*)    All other components within normal limits  CBC  DIFFERENTIAL  PREGNANCY, URINE  URINE MICROSCOPIC-ADD ON  GC/CHLAMYDIA PROBE AMP, GENITAL   Ct Abdomen Pelvis Wo Contrast  08/03/2011  *RADIOLOGY REPORT*  Clinical Data: Right-sided abdominal/flank pain, hematuria  CT ABDOMEN AND PELVIS WITHOUT CONTRAST  Technique:  Multidetector CT imaging of the abdomen and pelvis was performed following the standard protocol without intravenous contrast.  Comparison: Abdominal ultrasound - 04/03/2004  Findings:  The lack of intravenous contrast limits the ability to evaluate solid abdominal organs.  Normal hepatic contour.  Normal noncontrast appearance of the gallbladder.  No ascites.  Normal noncontrast appearance of the bilateral kidneys.  No renal stones or urinary obstruction.  No perinephric stranding.  Normal noncontrast appearance of the bilateral adrenal glands,  pancreas and spleen. Incidental note is made of a small splenule.  Scattered minimal colonic diverticulosis without evidence of diverticulitis on this noncontrast examination.  The bowel is otherwise normal in course and caliber without wall thickening or evidence of obstruction.  Normal noncontrast appearance of the appendix (coronal images 45 through 54).  No pneumoperitoneum, pneumatosis or portal venous gas.  The normal caliber of the abdominal aorta.  No retroperitoneal, mesenteric, pelvic or inguinal lymphadenopathy.  An intrauterine device is seen within the uterus.  No discrete adnexal mass.  No free fluid within the pelvis.  Limited visualization of the lower thorax is negative of focal airspace opacity or pleural effusion.  There is minimal dependent atelectasis within the left lower lobe.  Normal heart size.  No pericardial effusion.  No acute or aggressive osseous abnormalities.  IMPRESSION: 1.  No explanation for patient's right-sided flank pain. Specifically, no evidence of renal stones or urinary obstruction. 2.  Normal noncontrast appearance of the appendix.  Original Report Authenticated By: Waynard Reeds, M.D.     1. Pelvic pain   2. Back pain     1135-spoke with dr. Regino Schultze.  Plan is to do pelvic exam and localize pain as much as possible.  If it still appears to be R pelvic in origin, will schedule for OP pelvic ultrasound.  Discussed this and lab finding with him and with pt .  She agrees with plan.  MDM  rx- percocet, 20.  F/u with dr. Phillips Odor on Monday.  Take your zofran as directed.   Return if sxs worsen  Or change        Worthy Rancher, [place] 08/03/11 1337

## 2011-08-03 NOTE — ED Notes (Signed)
Pt presents to er from Dr. Lamar Blinks office with c/o lower right groin abd pain that radiates around to the right flank area and up the back and neck area. Nausea/diarrhea that started yesterday, pt also states that she woke up with several areas of bruising that was noticed this am, pt was seen at Dr. Phillips Odor office yesterday and was told that she had blood in her urine, pt was given lomotil, Zofran and Cipro yesterday

## 2011-08-03 NOTE — MAU Note (Signed)
Pt states, " I was sent from [facility] for a pelvic US because they think I might have an ovarian cyst. I've had pain in my low back and low abdomen for 2 days. Yesterday it started like right flank pain and right low abdomen but the pain is all the way across since the pelvic exam."

## 2011-08-03 NOTE — Discharge Instructions (Signed)
Abdominal Pain (Nonspecific)  Your exam might not show the exact reason you have abdominal pain. Since there are many different causes of abdominal pain, another checkup and more tests may be needed. It is very important to follow up for lasting (persistent) or worsening symptoms. A possible cause of abdominal pain in any person who still has his or her appendix is acute appendicitis. Appendicitis is often hard to diagnose. Normal blood tests, urine tests, ultrasound, and CT scans do not completely rule out early appendicitis or other causes of abdominal pain. Sometimes, only the changes that happen over time will allow appendicitis and other causes of abdominal pain to be determined. Other potential problems that may require surgery may also take time to become more apparent. Because of this, it is important that you follow all of the instructions below.  HOME CARE INSTRUCTIONS    Rest as much as possible.   Do not eat solid food until your pain is gone.   While adults or children have pain: A diet of water, weak decaffeinated tea, broth or bouillon, gelatin, oral rehydration solutions (ORS), frozen ice pops, or ice chips may be helpful.   When pain is gone in adults or children: Start a light diet (dry toast, crackers, applesauce, or white rice). Increase the diet slowly as long as it does not bother you. Eat no dairy products (including cheese and eggs) and no spicy, fatty, fried, or high-fiber foods.   Use no alcohol, caffeine, or cigarettes.   Take your regular medicines unless your caregiver told you not to.   Take any prescribed medicine as directed.   Only take over-the-counter or prescription medicines for pain, discomfort, or fever as directed by your caregiver. Do not give aspirin to children.  If your caregiver has given you a follow-up appointment, it is very important to keep that appointment. Not keeping the appointment could result in a permanent injury and/or lasting (chronic) pain and/or  disability. If there is any problem keeping the appointment, you must call to reschedule.   SEEK IMMEDIATE MEDICAL CARE IF:    Your pain is not gone in 24 hours.   Your pain becomes worse, changes location, or feels different.   You or your child has an oral temperature above 102 F (38.9 C), not controlled by medicine.   Your baby is older than 3 months with a rectal temperature of 102 F (38.9 C) or higher.   Your baby is 3 months old or younger with a rectal temperature of 100.4 F (38 C) or higher.   You have shaking chills.   You keep throwing up (vomiting) or cannot drink liquids.   There is blood in your vomit or you see blood in your bowel movements.   Your bowel movements become dark or black.   You have frequent bowel movements.   Your bowel movements stop (become blocked) or you cannot pass gas.   You have bloody, frequent, or painful urination.   You have yellow discoloration in the skin or whites of the eyes.   Your stomach becomes bloated or bigger.   You have dizziness or fainting.   You have chest or back pain.  MAKE SURE YOU:    Understand these instructions.   Will watch your condition.   Will get help right away if you are not doing well or get worse.  Document Released: 04/01/2005 Document Revised: 03/21/2011 Document Reviewed: 02/27/2009  [employer] Patient Information 2012 [employer].

## 2011-08-05 ENCOUNTER — Other Ambulatory Visit (HOSPITAL_COMMUNITY): Payer: 59

## 2011-08-05 ENCOUNTER — Other Ambulatory Visit (HOSPITAL_COMMUNITY): Payer: Self-pay | Admitting: Emergency Medicine

## 2011-08-05 DIAGNOSIS — N83209 Unspecified ovarian cyst, unspecified side: Secondary | ICD-10-CM

## 2011-08-05 LAB — GC/CHLAMYDIA PROBE AMP, GENITAL: Chlamydia, DNA Probe: NEGATIVE

## 2011-08-05 NOTE — ED Provider Notes (Signed)
Medical screening examination/treatment/procedure(s) were performed by non-physician practitioner and as supervising physician I was immediately available for consultation/collaboration.  Asheton Scheffler, MD 08/05/11 0529 

## 2011-08-07 ENCOUNTER — Other Ambulatory Visit: Payer: Self-pay

## 2011-08-07 ENCOUNTER — Ambulatory Visit (HOSPITAL_COMMUNITY): Payer: 59 | Attending: Cardiovascular Disease

## 2011-08-07 DIAGNOSIS — Z87891 Personal history of nicotine dependence: Secondary | ICD-10-CM | POA: Insufficient documentation

## 2011-08-07 DIAGNOSIS — E669 Obesity, unspecified: Secondary | ICD-10-CM | POA: Insufficient documentation

## 2011-08-07 DIAGNOSIS — R002 Palpitations: Secondary | ICD-10-CM | POA: Insufficient documentation

## 2011-08-28 ENCOUNTER — Ambulatory Visit (HOSPITAL_COMMUNITY)
Admission: RE | Admit: 2011-08-28 | Discharge: 2011-08-28 | Disposition: A | Discharge status: Home / Self Care | Payer: 59 | Source: Ambulatory Visit | Attending: Obstetrics and Gynecology | Admitting: Obstetrics and Gynecology

## 2011-08-28 DIAGNOSIS — Z1231 Encounter for screening mammogram for malignant neoplasm of breast: Secondary | ICD-10-CM | POA: Insufficient documentation

## 2011-08-30 ENCOUNTER — Other Ambulatory Visit (HOSPITAL_COMMUNITY): Payer: Self-pay | Admitting: Sports Medicine

## 2011-08-30 DIAGNOSIS — M545 Low back pain, unspecified: Secondary | ICD-10-CM

## 2011-08-30 DIAGNOSIS — R209 Unspecified disturbances of skin sensation: Secondary | ICD-10-CM

## 2011-09-03 ENCOUNTER — Ambulatory Visit (HOSPITAL_COMMUNITY)
Admission: RE | Admit: 2011-09-03 | Discharge: 2011-09-03 | Disposition: A | Discharge status: Home / Self Care | Payer: 59 | Source: Ambulatory Visit | Attending: Sports Medicine | Admitting: Sports Medicine

## 2011-09-03 ENCOUNTER — Other Ambulatory Visit (HOSPITAL_COMMUNITY): Payer: 59

## 2011-09-03 DIAGNOSIS — M51379 Other intervertebral disc degeneration, lumbosacral region without mention of lumbar back pain or lower extremity pain: Secondary | ICD-10-CM | POA: Insufficient documentation

## 2011-09-03 DIAGNOSIS — M545 Low back pain, unspecified: Secondary | ICD-10-CM | POA: Insufficient documentation

## 2011-09-03 DIAGNOSIS — M5126 Other intervertebral disc displacement, lumbar region: Secondary | ICD-10-CM | POA: Insufficient documentation

## 2011-09-03 DIAGNOSIS — R209 Unspecified disturbances of skin sensation: Secondary | ICD-10-CM

## 2011-09-03 DIAGNOSIS — M5137 Other intervertebral disc degeneration, lumbosacral region: Secondary | ICD-10-CM | POA: Insufficient documentation

## 2011-11-14 ENCOUNTER — Encounter (HOSPITAL_COMMUNITY): Payer: Self-pay | Admitting: *Deleted

## 2011-11-14 ENCOUNTER — Emergency Department (HOSPITAL_COMMUNITY)
Admission: EM | Admit: 2011-11-14 | Discharge: 2011-11-14 | Disposition: A | Discharge status: Home / Self Care | Payer: 59 | Source: Home / Self Care

## 2011-11-14 DIAGNOSIS — S39012A Strain of muscle, fascia and tendon of lower back, initial encounter: Secondary | ICD-10-CM

## 2011-11-14 DIAGNOSIS — M5430 Sciatica, unspecified side: Secondary | ICD-10-CM

## 2011-11-14 DIAGNOSIS — S335XXA Sprain of ligaments of lumbar spine, initial encounter: Secondary | ICD-10-CM

## 2011-11-14 DIAGNOSIS — M5432 Sciatica, left side: Secondary | ICD-10-CM

## 2011-11-14 HISTORY — DX: Dorsalgia, unspecified: M54.9

## 2011-11-14 MED ORDER — TRAMADOL HCL 50 MG PO TABS: 50.0000 mg | ORAL_TABLET | Freq: Four times a day (QID) | ORAL | Status: AC | PRN

## 2011-11-14 MED ORDER — PREDNISONE 20 MG PO TABS: 20.0000 mg | ORAL_TABLET | Freq: Two times a day (BID) | ORAL | Status: AC

## 2011-11-14 NOTE — ED Provider Notes (Signed)
[name] is a 36 y.o. female who presents to Urgent Care today for back pain radiating to the leg.  Patient bit down yesterday to pick something up. Upon arising she felt a pop in her back and immediate pain with radiating to the left leg.  High-dose ibuprofen and Flexeril have not been very helpful.  She notes some pain radiating into the right buttock but no radicular symptoms to her right lower leg.  She denies any weakness or trouble walking or bowel or bladder difficulties.  She has a pertinent history for lumbar pain and has an MRI that does show some mildly bulging discs.     PMH reviewed. Palpitations History  Substance Use Topics  . Smoking status: Former Smoker    Quit date: 08/03/2002  . Smokeless tobacco: Not on file  . Alcohol Use: No   ROS as above Medications reviewed. No current facility-administered medications for this encounter.   Current Outpatient Prescriptions  Medication Sig Dispense Refill  . aspirin 81 MG tablet Take 81 mg by mouth daily.      Marland Kitchen esomeprazole (NEXIUM) 40 MG capsule Take 40 mg by mouth daily before breakfast.      . levonorgestrel (MIRENA) 20 MCG/24HR IUD 1 each by Intrauterine route once.      . metoprolol succinate (TOPROL-XL) 50 MG 24 hr tablet Take 25 mg by mouth daily. 1/2 tablet daily      . predniSONE (DELTASONE) 20 MG tablet Take 1 tablet (20 mg total) by mouth 2 (two) times daily.  14 tablet  0  . traMADol (ULTRAM) 50 MG tablet Take 1 tablet (50 mg total) by mouth every 6 (six) hours as needed for pain.  30 tablet  0  . DISCONTD: escitalopram (LEXAPRO) 20 MG tablet Take 20 mg by mouth daily.        Exam:  BP 122/87  Pulse 108  Temp 98.4 F (36.9 C) (Oral)  Resp 18  SpO2 99% Gen: Well NAD HEENT: EOMI,  MMM Back: Nontender over spinal midline. Bilateral lumbar paraspinal tenderness to palpation.  Patient can get on and off exam table by herself.  Neuro: Sensation intact throughout. Strength normal in both lower extremity.    Reflexes are intact bilaterally at the knee and ankle. Exts: Non edematous BL  LE, warm and well perfused.   No results found for this or any previous visit (from the past 24 hour(s)). No results found.  Assessment and Plan: 36 y.o. female with lumbar back strain plus radicular symptoms indicative of sciatica.  Plan to treat with prednisone 40 mg for 7 days and tramadol as needed for pain. Additionally asked the patient to take her home and Flexeril for muscle spasm.  She has an established appointment already on Monday with orthopedists.  I recommended she followup then. Discussed warning signs or symptoms. Please see discharge instructions. Patient expresses understanding.      Rodolph Bong, MD 11/14/11 [phone]

## 2011-11-14 NOTE — ED Notes (Signed)
Pt  Reports  Low  Back  Pain        She  Reports  She  Has  A  Bulging  Disc by  MRI   -   She reports  She  Bent  Wrong  yest  And  Felt a  Pop    She  denys  Any  Urinary  Symptoms            She  Ambulated  To    Exam  Room

## 2011-11-14 NOTE — ED Provider Notes (Signed)
Medical screening examination/treatment/procedure(s) were performed by a resident physician and as supervising physician I was immediately available for consultation/collaboration.  Leslee Home, M.D.  Reuben Likes, MD 11/14/11 2013

## 2013-03-24 ENCOUNTER — Other Ambulatory Visit (HOSPITAL_COMMUNITY): Payer: Self-pay | Admitting: Family Medicine

## 2013-03-24 ENCOUNTER — Ambulatory Visit (HOSPITAL_COMMUNITY)
Admission: RE | Admit: 2013-03-24 | Discharge: 2013-03-24 | Disposition: A | Discharge status: Home / Self Care | Payer: 59 | Source: Ambulatory Visit | Attending: Family Medicine | Admitting: Family Medicine

## 2013-03-24 DIAGNOSIS — Z87891 Personal history of nicotine dependence: Secondary | ICD-10-CM | POA: Insufficient documentation

## 2013-03-24 DIAGNOSIS — J984 Other disorders of lung: Secondary | ICD-10-CM | POA: Insufficient documentation

## 2013-03-24 DIAGNOSIS — R091 Pleurisy: Secondary | ICD-10-CM

## 2013-03-24 DIAGNOSIS — R0789 Other chest pain: Secondary | ICD-10-CM | POA: Insufficient documentation

## 2013-03-24 DIAGNOSIS — M940 Chondrocostal junction syndrome [Tietze]: Secondary | ICD-10-CM

## 2013-12-11 IMAGING — CT CT ABD-PELV W/O CM
2 of 3 series · 16 of 46 positions shown, 18 images · non-contrast
Comparison: Abdominal ultrasound - 04/03/2004

CLINICAL DATA: Right-sided abdominal/flank pain, hematuria

CT ABDOMEN AND PELVIS WITHOUT CONTRAST
TECHNIQUE: Multidetector CT imaging of the abdomen and pelvis was
performed following the standard protocol without intravenous
contrast.

[Series 2: standard/full over (age)lbs 5.0 · axial · 0.84mm/px · z∈[-521,-116]mm · 13 of 93 slices shown, 15 images]
[im 6/93  soft-tissue]
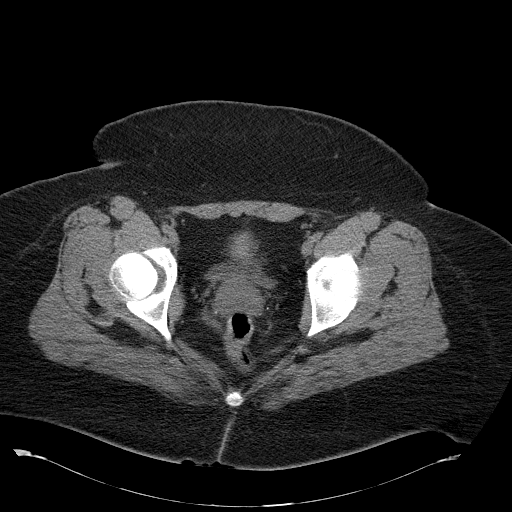
[im 6/93  bone]
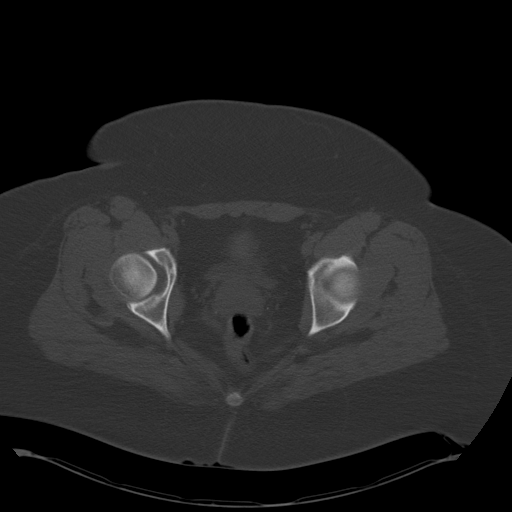
[im 12/93  soft-tissue]
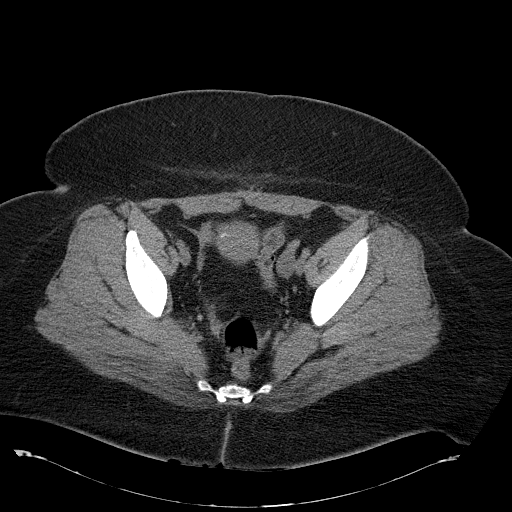
[im 18/93  soft-tissue]
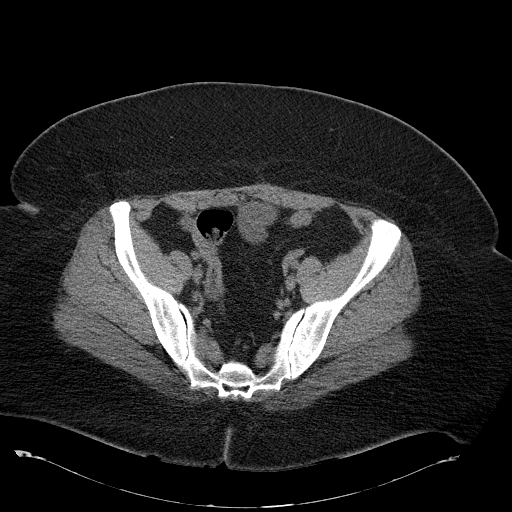
[im 27/93  soft-tissue]
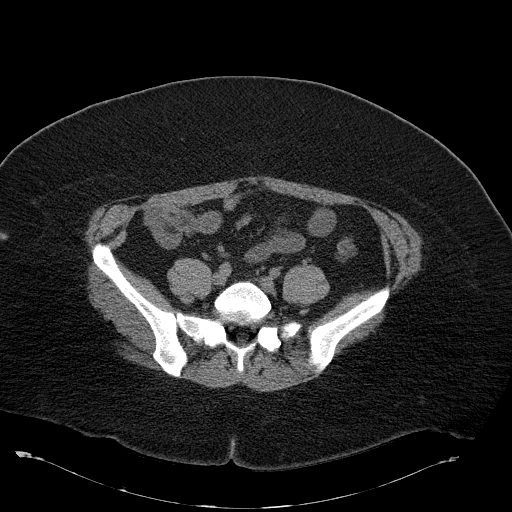
[im 33/93  soft-tissue]
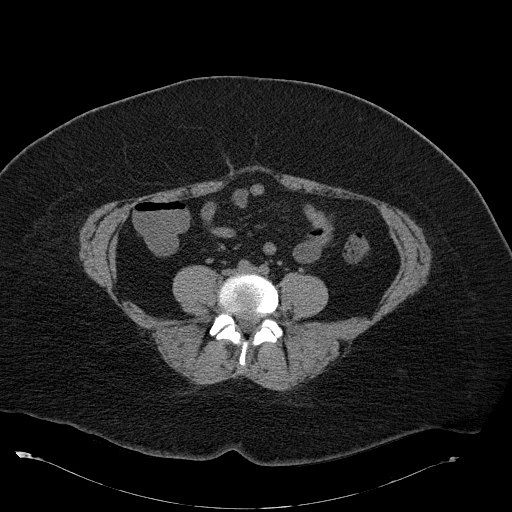
[im 39/93  soft-tissue]
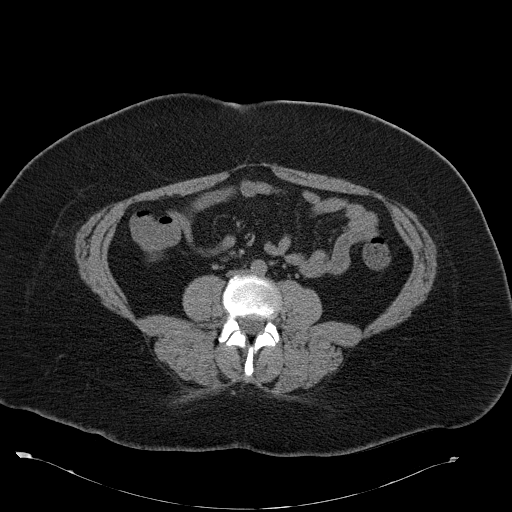
[im 48/93  soft-tissue]
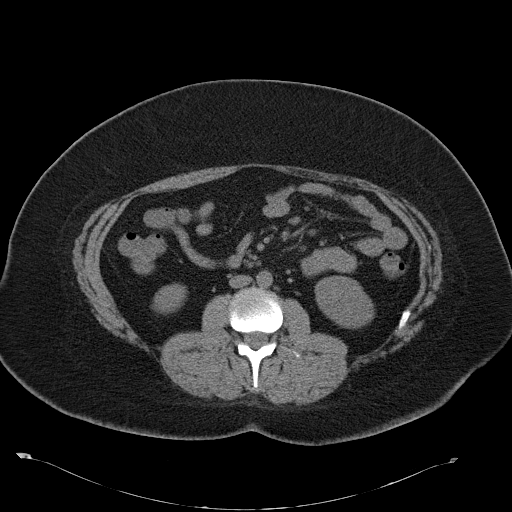
[im 54/93  soft-tissue]
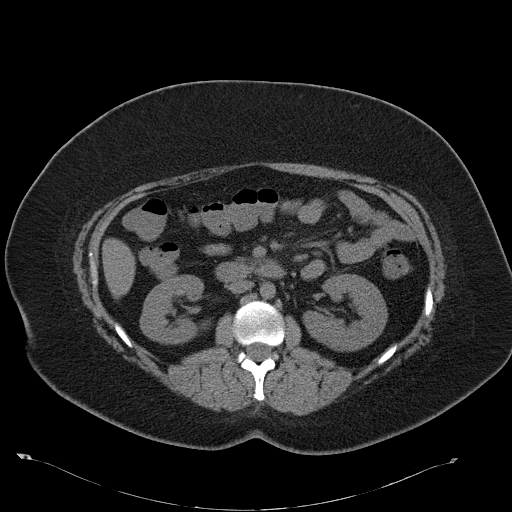
[im 60/93  soft-tissue]
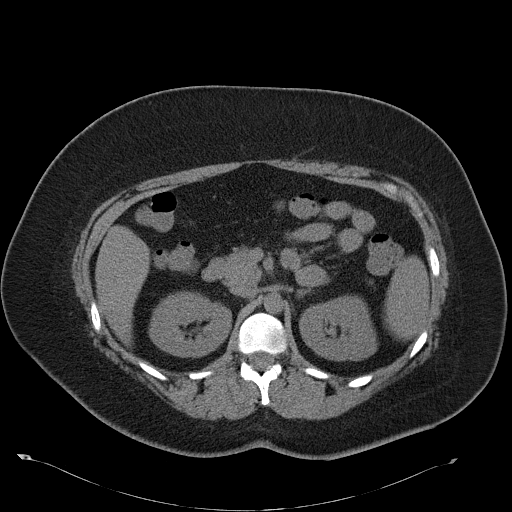
[im 60/93  bone]
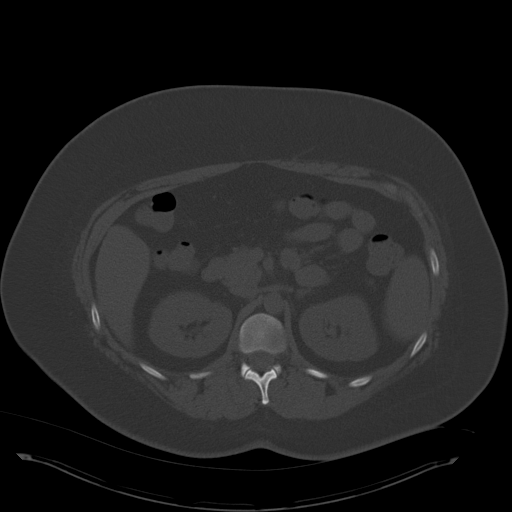
[im 66/93  soft-tissue]
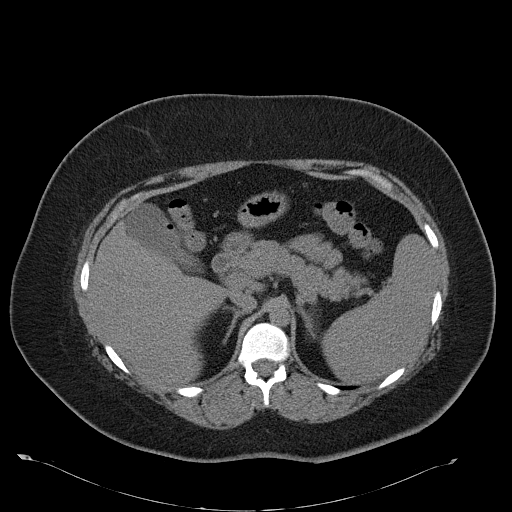
[im 75/93  soft-tissue]
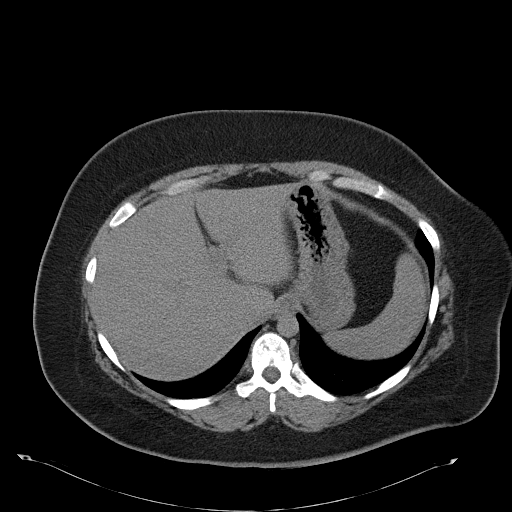
[im 81/93  soft-tissue]
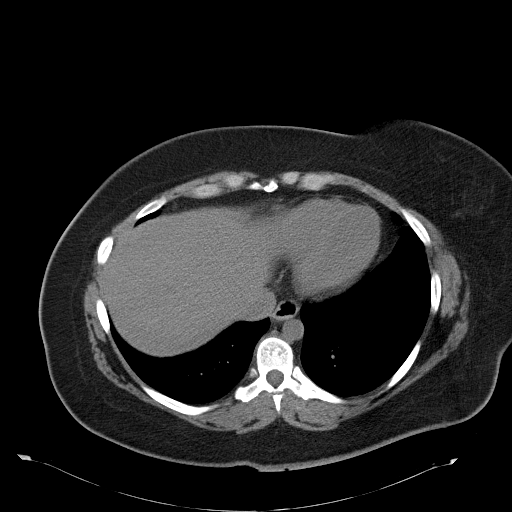
[im 87/93  soft-tissue]
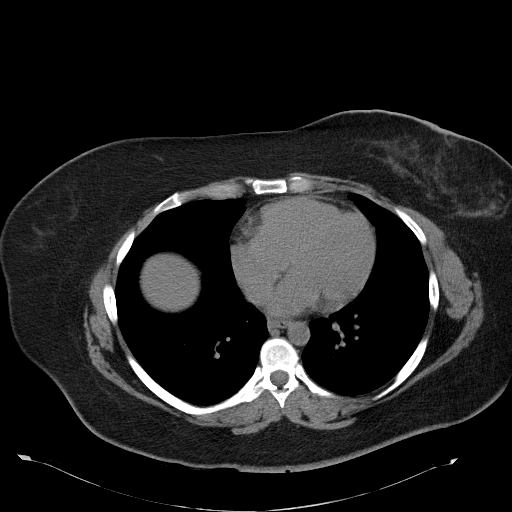

[Series 4: mpr coronal · coronal · 0.82mm/px · 3 of 101 slices shown]
[im 34/101  soft-tissue]
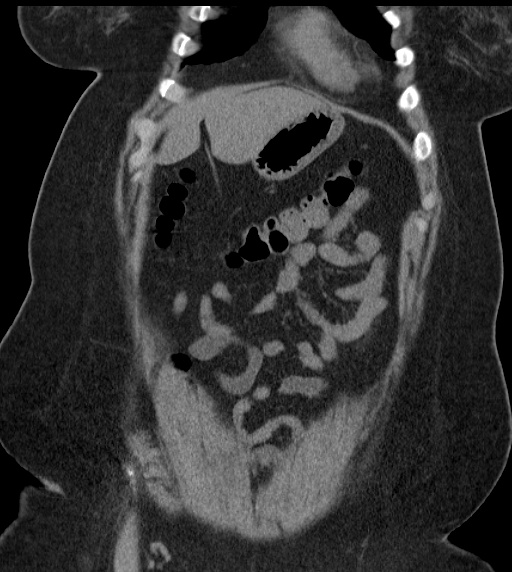
[im 45/101  soft-tissue]
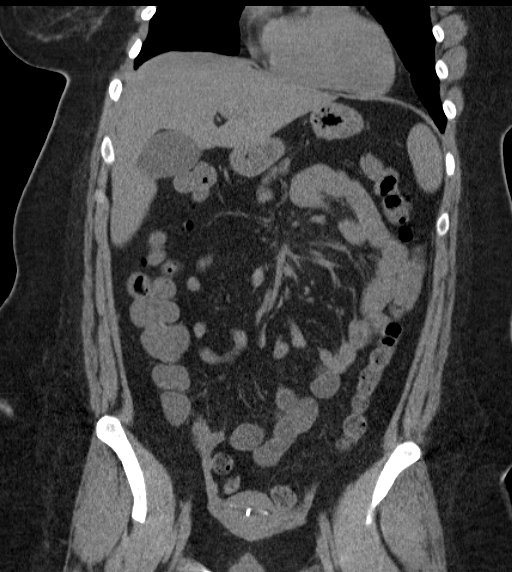
[im 56/101  soft-tissue]
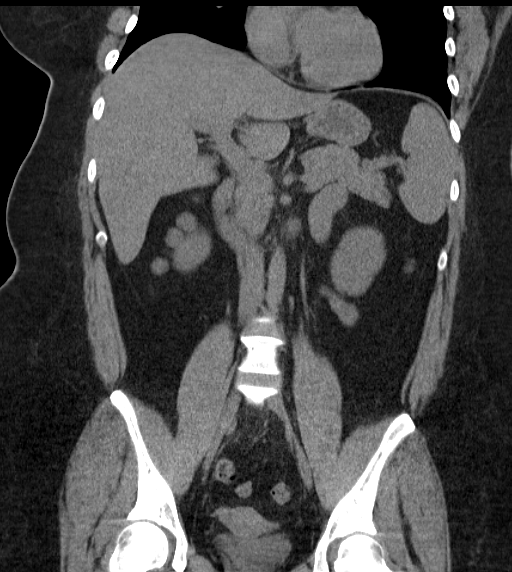

[16 of 46 positions shown; findings below may reference images not displayed]

FINDINGS: The lack of intravenous contrast limits the ability to evaluate
solid abdominal organs.

Normal hepatic contour.  Normal noncontrast appearance of the
gallbladder.  No ascites.

Normal noncontrast appearance of the bilateral kidneys.  No renal
stones or urinary obstruction.  No perinephric stranding.  Normal
noncontrast appearance of the bilateral adrenal glands, pancreas
and spleen. Incidental note is made of a small splenule.

Scattered minimal colonic diverticulosis without evidence of
diverticulitis on this noncontrast examination.  The bowel is
otherwise normal in course and caliber without wall thickening or
evidence of obstruction.  Normal noncontrast appearance of the
appendix (coronal images 45 through 54).  No pneumoperitoneum,
pneumatosis or portal venous gas.  The normal caliber of the
abdominal aorta.  No retroperitoneal, mesenteric, pelvic or
inguinal lymphadenopathy.

An intrauterine device is seen within the uterus.  No discrete
adnexal mass.  No free fluid within the pelvis.

Limited visualization of the lower thorax is negative of focal
airspace opacity or pleural effusion.  There is minimal dependent
atelectasis within the left lower lobe.  Normal heart size.  No
pericardial effusion.

No acute or aggressive osseous abnormalities.
IMPRESSION: 1.  No explanation for patient's right-sided flank pain.
Specifically, no evidence of renal stones or urinary obstruction.
2.  Normal noncontrast appearance of the appendix.

## 2014-02-14 ENCOUNTER — Encounter (HOSPITAL_COMMUNITY): Payer: Self-pay | Admitting: *Deleted

## 2015-03-31 ENCOUNTER — Other Ambulatory Visit (HOSPITAL_COMMUNITY): Payer: Self-pay | Admitting: Sports Medicine

## 2015-03-31 DIAGNOSIS — M7582 Other shoulder lesions, left shoulder: Principal | ICD-10-CM

## 2015-03-31 DIAGNOSIS — M778 Other enthesopathies, not elsewhere classified: Secondary | ICD-10-CM

## 2015-04-05 ENCOUNTER — Ambulatory Visit (HOSPITAL_COMMUNITY)
Admission: RE | Admit: 2015-04-05 | Discharge: 2015-04-05 | Disposition: A | Discharge status: Home / Self Care | Payer: 59 | Source: Ambulatory Visit | Attending: Sports Medicine | Admitting: Sports Medicine

## 2015-04-05 DIAGNOSIS — M75102 Unspecified rotator cuff tear or rupture of left shoulder, not specified as traumatic: Secondary | ICD-10-CM | POA: Diagnosis not present

## 2015-04-05 DIAGNOSIS — M7582 Other shoulder lesions, left shoulder: Secondary | ICD-10-CM

## 2015-04-05 DIAGNOSIS — M7552 Bursitis of left shoulder: Secondary | ICD-10-CM | POA: Diagnosis not present

## 2015-04-05 DIAGNOSIS — M778 Other enthesopathies, not elsewhere classified: Secondary | ICD-10-CM

## 2015-04-05 DIAGNOSIS — M25512 Pain in left shoulder: Secondary | ICD-10-CM | POA: Diagnosis present

## 2015-04-13 ENCOUNTER — Ambulatory Visit (HOSPITAL_COMMUNITY): Payer: 59

## 2015-04-24 DIAGNOSIS — G8929 Other chronic pain: Secondary | ICD-10-CM | POA: Diagnosis not present

## 2015-04-24 DIAGNOSIS — M25512 Pain in left shoulder: Secondary | ICD-10-CM | POA: Diagnosis not present

## 2015-04-24 DIAGNOSIS — M75112 Incomplete rotator cuff tear or rupture of left shoulder, not specified as traumatic: Secondary | ICD-10-CM | POA: Diagnosis not present

## 2015-04-27 ENCOUNTER — Ambulatory Visit (HOSPITAL_COMMUNITY): Payer: 59 | Attending: Sports Medicine | Admitting: Occupational Therapy

## 2015-04-27 ENCOUNTER — Encounter (HOSPITAL_COMMUNITY): Payer: Self-pay | Admitting: Occupational Therapy

## 2015-04-27 DIAGNOSIS — M6281 Muscle weakness (generalized): Secondary | ICD-10-CM | POA: Insufficient documentation

## 2015-04-27 DIAGNOSIS — M25512 Pain in left shoulder: Secondary | ICD-10-CM | POA: Diagnosis not present

## 2015-04-27 DIAGNOSIS — M629 Disorder of muscle, unspecified: Secondary | ICD-10-CM | POA: Diagnosis not present

## 2015-04-27 DIAGNOSIS — M75112 Incomplete rotator cuff tear or rupture of left shoulder, not specified as traumatic: Secondary | ICD-10-CM | POA: Diagnosis not present

## 2015-04-27 DIAGNOSIS — M6289 Other specified disorders of muscle: Secondary | ICD-10-CM

## 2015-04-27 NOTE — Patient Instructions (Signed)
  1) Flexion Wall Stretch    Face wall, place affected handon wall in front of you. Slide hand up the wall  and lean body in towards the wall. Hold for 5 seconds. Repeat 10 times. 1-2 times/day.     2) Towel Stretch with Internal Rotation   Gently pull up your affected arm  behind your back with the assist of a towel            3) Corner Stretch    Stand at a corner of a wall, place your arms on the walls with elbows bent. Lean into the corner until a stretch is felt along the front of your chest and/or shoulders. Hold for 5 seconds. Repeat 10X, 1-2 times/day.    4) Posterior Capsule Stretch    Bring the involved arm across chest. Grasp elbow and pull toward chest until you feel a stretch in the back of the upper arm and shoulder. Hold 5 seconds. Repeat 10X. Complete 1-2 times/day.    5) Scapular Retraction    Tuck chin back as you pinch shoulder blades together.  Hold 5 seconds. Repeat 10X. Complete 1-2 times/day.   

## 2015-04-27 NOTE — Therapy (Signed)
[place], [place], [address] Phone: [phone]   Fax:  [phone]  Occupational Therapy Evaluation  Patient Details  Name: [name] MRN: [id number] Date of Birth: [birth year] Referring Provider: Dr. Delilah Shan  Encounter Date: 04/27/2015      OT End of Session - 04/27/15 1551    Visit Number 1   Number of Visits 8   Date for OT Re-Evaluation 06/26/15  Mini reassessment 05/27/14   Authorization Type [employer] UMR $20 copay, no visit limit   OT Start Time 1310   OT Stop Time 1342   OT Time Calculation (min) 32 min   Activity Tolerance Patient tolerated treatment well   Behavior During Therapy [facility] for tasks assessed/performed      Past Medical History  Diagnosis Date  . GERD (gastroesophageal reflux disease)   . Palpitations   . PONV (postoperative nausea and vomiting)   . Spinal headache   . Back pain     Past Surgical History  Procedure Laterality Date  . Cesarean section    . Left knee arthroscopic surgery    . Wisdom tooth extraction      There were no vitals filed for this visit.  Visit Diagnosis:  Partial tear of rotator cuff, left  Pain in left shoulder  Muscle weakness of left upper extremity  Tight fascia      Subjective Assessment - 04/27/15 1548    Subjective  S: I can do whatever I want, it may or may not be painful depending on the day.    Pertinent History Pt is a 40 y/o female with a partial rotator cuff tear in the left shoulder, which occured several years ago (8 or 9), progressively getting worse over the past year. Pt reports she can complete her daily tasks, however experiences pain with some movements. She is currently using ice and heat for pain management, heat is more successful than ice. Dr. Delilah Shan referred pt for occupational therapy evaluation and treatment.    Special Tests FOTO Score: 69/100 (31% impairment)   Patient Stated Goals To get rid of the pain in my  shoulder   Currently in Pain? Yes   Pain Score 1    Pain Location Shoulder   Pain Orientation Left   Pain Descriptors / Indicators Sore   Pain Type Chronic pain           [facility] OT Assessment - 04/27/15 1309    Assessment   Diagnosis left partial rotator cuff tear   Referring Provider Dr. Delilah Shan   Onset Date --  several years ago, progressively worse within last year   Prior Therapy None    Precautions   Precautions Shoulder   Type of Shoulder Precautions no lifting over 10#   Balance Screen   Has the patient fallen in the past 6 months No   Has the patient had a decrease in activity level because of a fear of falling?  No   Is the patient reluctant to leave their home because of a fear of falling?  No   Prior Function   Level of Independence Independent with basic ADLs   Vocation Full time employment   Vocation Requirements RN: moving equipment, pushing, pulling, rolling equipment   Leisure spending time with kids (ages 14 & 62)   ADL   ADL comments Pt is having difficulty with donning bra, dressing, reaching and lifting weighted objects, childcare tasks, household chores. Pt reports she can complete  ADL tasks however experiences pain with certain movements.    Written Expression   Dominant Hand Right   Cognition   Overall Cognitive Status Within Functional Limits for tasks assessed   ROM / Strength   AROM / PROM / Strength AROM;PROM;Strength   Palpation   Palpation comment Max fascial restrictions in let upper arm, trapezius, and scapularis regions.    AROM   Overall AROM Comments Assessed seated, ER/IR adducted   AROM Assessment Site Shoulder   Right/Left Shoulder Left   Left Shoulder Flexion 162 Degrees   Left Shoulder ABduction 160 Degrees   Left Shoulder Internal Rotation 90 Degrees   Left Shoulder External Rotation 66 Degrees   PROM   Overall PROM Comments P/ROM is [facility]   PROM Assessment Site Shoulder   Right/Left Shoulder Left   Strength   Overall Strength  Comments Assessed seated, ER/IR adducted   Strength Assessment Site Shoulder   Right/Left Shoulder Left   Left Shoulder Flexion 4/5   Left Shoulder ABduction 4/5   Left Shoulder Internal Rotation 4+/5   Left Shoulder External Rotation 4+/5                         OT Education - 04/27/15 1551    Education provided Yes   Education Details shoulder stretches   Person(s) Educated Patient   Methods Explanation;Demonstration;Handout   Comprehension Verbalized understanding;Returned demonstration          OT Short Term Goals - 04/27/15 1600    OT SHORT TERM GOAL #1   Title Pt will be educated on and independent in HEP.    Time 4   Period Weeks   Status New   OT SHORT TERM GOAL #2   Title Pt will return to prior level of functioning and independence in ADL and work tasks.    Time 4   Period Weeks   Status New   OT SHORT TERM GOAL #3   Title Pt will decrease fascial restrictions in LUE from max to mod amounts or less.    Time 4   Period Weeks   Status New   OT SHORT TERM GOAL #4   Title Pt will decrease pain to 1/10 or less during daily tasks to increase ability to donn bra.    OT SHORT TERM GOAL #5   Title Pt will increase strength to 5/5 to increase ability to perform work and household tasks at her prior level of functioning.    Time 4   Period Weeks   Status New                  Plan - 04/27/15 1552    Clinical Impression Statement A: Pt is a 40 y/o female with a left partial rotator cuff tear, occuring approximately 8 or 9 years ago, however has progressively become more painful over the past year. She has had 2 shots, the most recent on 04/24/15 which has not resulted in improvement. Pt presents with increased pain and fascial restrictions, decreased strength  and functional use of the LUE.    Pt will benefit from skilled therapeutic intervention in order to improve on the following deficits (Retired) Decreased strength;Pain;Impaired UE  functional use;Increased fascial restricitons;Impaired flexibility   Rehab Potential Good   OT Frequency 2x / week   OT Duration 4 weeks   OT Treatment/Interventions Self-care/ADL training;Passive range of motion;Patient/family education;Cryotherapy;Electrical Stimulation;Moist Heat;Therapeutic exercise;Manual Therapy;Therapeutic activities   Plan P: Pt will benefit  from skilled OT intervention to decrease pain and fascial restrictions, increase strength and functional use of LUE. Treatment plan: Myofascial release, manual stretching, P/ROM, A/ROM, general LUE strengthening, proximal shoulder strengthening, scapular stability exercises.    OT Home Exercise Plan shoulder stretches   Consulted and Agree with Plan of Care Patient        Problem List Patient Active Problem List   Diagnosis Date Noted  . Palpitations 07/26/2011    Guadelupe Sabin, OTR/L  [phone]  04/27/2015, 4:03 PM  [place] [address] [place], [place], [address] Phone: [phone]   Fax:  [phone]  Name: KINNA GREENIA MRN: [id number] Date of Birth: 1975-08-21

## 2015-04-28 ENCOUNTER — Encounter (HOSPITAL_COMMUNITY): Payer: Self-pay | Admitting: Occupational Therapy

## 2015-04-28 ENCOUNTER — Ambulatory Visit (HOSPITAL_COMMUNITY): Payer: 59 | Admitting: Occupational Therapy

## 2015-04-28 DIAGNOSIS — M6289 Other specified disorders of muscle: Secondary | ICD-10-CM

## 2015-04-28 DIAGNOSIS — M629 Disorder of muscle, unspecified: Secondary | ICD-10-CM | POA: Diagnosis not present

## 2015-04-28 DIAGNOSIS — M25512 Pain in left shoulder: Secondary | ICD-10-CM

## 2015-04-28 DIAGNOSIS — M6281 Muscle weakness (generalized): Secondary | ICD-10-CM

## 2015-04-28 DIAGNOSIS — M75112 Incomplete rotator cuff tear or rupture of left shoulder, not specified as traumatic: Secondary | ICD-10-CM | POA: Diagnosis not present

## 2015-04-28 NOTE — Therapy (Signed)
[place], [place], [address] Phone: [phone]   Fax:  [phone]  Occupational Therapy Treatment  Patient Details  Name: [name] MRN: [id number] Date of Birth: June 14, 1975 Referring Provider: Dr. Delilah Shan  Encounter Date: 04/28/2015      OT End of Session - 04/28/15 1603    Visit Number 2   Number of Visits 8   Date for OT Re-Evaluation 06/26/15  Mini reassessment 05/27/14   Authorization Type [employer] UMR $20 copay, no visit limit   OT Start Time 1305   OT Stop Time 1344   OT Time Calculation (min) 39 min   Activity Tolerance Patient tolerated treatment well   Behavior During Therapy [facility] for tasks assessed/performed      Past Medical History  Diagnosis Date  . GERD (gastroesophageal reflux disease)   . Palpitations   . PONV (postoperative nausea and vomiting)   . Spinal headache   . Back pain     Past Surgical History  Procedure Laterality Date  . Cesarean section    . Left knee arthroscopic surgery    . Wisdom tooth extraction      There were no vitals filed for this visit.  Visit Diagnosis:  Pain in left shoulder  Muscle weakness of left upper extremity  Tight fascia      Subjective Assessment - 04/28/15 1308    Subjective  S: I was really sore this morning after yesterday and those stretches.    Currently in Pain? No/denies            [facility] OT Assessment - 04/28/15 1306    Assessment   Diagnosis left partial rotator cuff tear   Precautions   Precautions Shoulder   Type of Shoulder Precautions no lifting over 10#                  OT Treatments/Exercises (OP) - 04/28/15 1308    Exercises   Exercises Shoulder   Shoulder Exercises: Supine   Protraction PROM;5 reps;AROM;10 reps   Horizontal ABduction PROM;5 reps;AROM;10 reps   External Rotation PROM;5 reps;AROM;10 reps   External Rotation Limitations completed in abduction   Internal Rotation PROM;5  reps;AROM;10 reps   Internal Rotation Limitations completed in abduction   Flexion PROM;5 reps;AAROM;10 reps   ABduction PROM;5 reps;AROM;10 reps   Shoulder Exercises: Seated   Elevation AROM;10 reps   Extension AROM;10 reps   Row AROM;10 reps   Shoulder Exercises: ROM/Strengthening   Thumb Tacks 1'   Prot/Ret//Elev/Dep 1'   Manual Therapy   Manual Therapy Myofascial release   Manual therapy comments manual therapy completed prior to therapeutic exercises   Myofascial Release Myofascial release to left upper arm, trapezius, and scapularis regions to decrease pain and fascial restrictions and increase joint mobility                 OT Education - 04/28/15 1603    Education provided Yes   Education Details scapular A/ROM exercises   Person(s) Educated Patient   Methods Explanation;Demonstration;Handout   Comprehension Verbalized understanding;Returned demonstration          OT Short Term Goals - 04/28/15 1605    OT SHORT TERM GOAL #1   Title Pt will be educated on and independent in HEP.    Time 4   Period Weeks   Status On-going   OT SHORT TERM GOAL #2   Title Pt will return to prior level of functioning and independence  in ADL and work tasks.    Time 4   Period Weeks   Status On-going   OT SHORT TERM GOAL #3   Title Pt will decrease fascial restrictions in LUE from max to mod amounts or less.    Time 4   Period Weeks   Status On-going   OT SHORT TERM GOAL #4   Title Pt will decrease pain to 1/10 or less during daily tasks to increase ability to donn bra.    Status On-going   OT SHORT TERM GOAL #5   Title Pt will increase strength to 5/5 to increase ability to perform work and household tasks at her prior level of functioning.    Time 4   Period Weeks   Status On-going                  Plan - 04/28/15 1603    Clinical Impression Statement A: Initiated myofascial release, manual therapy, AA/ROM and A/ROM exercises this session. Pt completed  A/ROM in supine with the exception of flexion which she complete AA/ROM due to inability to initiate flexion motion in A/ROM.    Plan P: Add AA/ROM and A/ROM in standing as pt is able to tolerate. Provide and review evaluation.        Problem List Patient Active Problem List   Diagnosis Date Noted  . Palpitations 07/26/2011    Guadelupe Sabin, OTR/L  [phone]  04/28/2015, 4:07 PM  [place] [address] [place], [place], [address] Phone: [phone]   Fax:  [phone]  Name: [name] MRN: [id number] Date of Birth: 26-Jun-1975

## 2015-04-28 NOTE — Patient Instructions (Signed)
1) Seated Row   Sit up straight with elbows by your sides. Pull back with shoulders/elbows, keeping forearms straight, as if pulling back on the reins of a horse. Squeeze shoulder blades together. Repeat _10-15__times, __2__sets/day    2) Shoulder Elevation    Sit up straight with arms by your sides. Slowly bring your shoulders up towards your ears. Repeat_10-15__times, __2__ sets/day    3) Shoulder Extension    Sit up straight with both arms by your side, draw your arms back behind your waist. Keep your elbows straight. Repeat _10-15___times, __2__sets/day.        

## 2015-05-02 ENCOUNTER — Ambulatory Visit (HOSPITAL_COMMUNITY): Payer: 59 | Admitting: Occupational Therapy

## 2015-05-02 ENCOUNTER — Encounter (HOSPITAL_COMMUNITY): Payer: Self-pay | Admitting: Occupational Therapy

## 2015-05-02 DIAGNOSIS — M75112 Incomplete rotator cuff tear or rupture of left shoulder, not specified as traumatic: Secondary | ICD-10-CM | POA: Diagnosis not present

## 2015-05-02 DIAGNOSIS — M6289 Other specified disorders of muscle: Secondary | ICD-10-CM

## 2015-05-02 DIAGNOSIS — M629 Disorder of muscle, unspecified: Secondary | ICD-10-CM

## 2015-05-02 DIAGNOSIS — M25512 Pain in left shoulder: Secondary | ICD-10-CM

## 2015-05-02 DIAGNOSIS — M6281 Muscle weakness (generalized): Secondary | ICD-10-CM | POA: Diagnosis not present

## 2015-05-02 NOTE — Patient Instructions (Signed)
1) Shoulder Protraction    Begin with elbows by your side, slowly "punch" straight out in front of you keeping arms/elbows straight. Repeat 10-15___times, _1-2___set/day.     2) Shoulder Flexion  Supine:     Standing:         Begin with arms at your side with thumbs pointed up, slowly raise both arms up and forward towards overhead. Repeat_10-15__times, _1-2__set/day.               3) Horizontal abduction/adduction  Supine:   Standing:           Begin with arms straight out in front of you, bring out to the side in at "T" shape. Keep arms straight entire time. Repeat _10-15___times, _1-2___sets/day.                 4) Internal & External Rotation    *No band* -Stand with elbows at the side and elbows bent 90 degrees. Move your forearms away from your body, then bring back inward toward the body.  Repeat _10-15__times, _1-2__sets/day    5) Shoulder Abduction  Supine:     Standing:       Lying on your back begin with your arms flat on the table next to your side. Slowly move your arms out to the side so that they go overhead, in a jumping jack or snow angel movement. Repeat _10-15__times, _1-2__sets/day

## 2015-05-02 NOTE — Therapy (Signed)
[place], [place], [address] Phone: [phone]   Fax:  [phone]  Occupational Therapy Treatment  Patient Details  Name: [name] MRN: [id number] Date of Birth: [birth year] Referring Provider: Dr. Delilah Shan  Encounter Date: 05/02/2015      OT End of Session - 05/02/15 1446    Visit Number 3   Number of Visits 8   Date for OT Re-Evaluation 06/26/15  Mini reassessment 05/27/14   Authorization Type [employer] UMR $20 copay, no visit limit   OT Start Time 1302   OT Stop Time 1347   OT Time Calculation (min) 45 min   Activity Tolerance Patient tolerated treatment well   Behavior During Therapy [facility] for tasks assessed/performed      Past Medical History  Diagnosis Date  . GERD (gastroesophageal reflux disease)   . Palpitations   . PONV (postoperative nausea and vomiting)   . Spinal headache   . Back pain     Past Surgical History  Procedure Laterality Date  . Cesarean section    . Left knee arthroscopic surgery    . Wisdom tooth extraction      There were no vitals filed for this visit.  Visit Diagnosis:  Pain in left shoulder  Muscle weakness of left upper extremity  Tight fascia      Subjective Assessment - 05/02/15 1303    Subjective  S: It felt pretty good this weekend, a little sore today.    Currently in Pain? Yes   Pain Score 1    Pain Location Shoulder   Pain Orientation Left   Pain Descriptors / Indicators Sore   Pain Type Chronic pain            [facility] OT Assessment - 05/02/15 1303    Assessment   Diagnosis left partial rotator cuff tear   Precautions   Precautions Shoulder   Type of Shoulder Precautions no lifting over 10#                  OT Treatments/Exercises (OP) - 05/02/15 1305    Exercises   Exercises Shoulder   Shoulder Exercises: Supine   Protraction PROM;5 reps;AROM;10 reps   Horizontal ABduction PROM;5 reps;AROM;10 reps   External Rotation PROM;5  reps;AROM;10 reps   External Rotation Limitations completed in abduction   Internal Rotation PROM;5 reps;AROM;10 reps   Internal Rotation Limitations completed in abduction   Flexion PROM;5 reps;AROM;10 reps   ABduction PROM;5 reps;AROM;10 reps   Shoulder Exercises: Seated   Elevation AROM;10 reps   Extension AROM;10 reps   Row AROM;10 reps   Protraction AROM;10 reps   Horizontal ABduction AROM;10 reps   External Rotation AROM;10 reps   Internal Rotation AROM;10 reps   Flexion AROM;10 reps   Abduction AROM;10 reps   Shoulder Exercises: Therapy Ball   Right/Left Other (comment)  3 reps each direction   Shoulder Exercises: ROM/Strengthening   Thumb Tacks 1'   Proximal Shoulder Strengthening, Supine 10X each no rest breaks   Proximal Shoulder Strengthening, Seated 10X each no rest breaks   Prot/Ret//Elev/Dep 1'   Manual Therapy   Manual Therapy Myofascial release   Manual therapy comments manual therapy completed prior to therapeutic exercises   Myofascial Release Myofascial release to left upper arm, trapezius, and scapularis regions to decrease pain and fascial restrictions and increase joint mobility                 OT Education -  05/02/15 1445    Education provided Yes   Education Details A/ROM exercises in supine & standing; provided evaluation    Person(s) Educated Patient   Methods Explanation;Demonstration;Handout   Comprehension Verbalized understanding;Returned demonstration          OT Short Term Goals - 04/28/15 1605    OT SHORT TERM GOAL #1   Title Pt will be educated on and independent in [place].    Time 4   Period Weeks   Status On-going   OT SHORT TERM GOAL #2   Title Pt will return to prior level of functioning and independence in ADL and work tasks.    Time 4   Period Weeks   Status On-going   OT SHORT TERM GOAL #3   Title Pt will decrease fascial restrictions in LUE from max to mod amounts or less.    Time 4   Period Weeks   Status  On-going   OT SHORT TERM GOAL #4   Title Pt will decrease pain to 1/10 or less during daily tasks to increase ability to donn bra.    Status On-going   OT SHORT TERM GOAL #5   Title Pt will increase strength to 5/5 to increase ability to perform work and household tasks at her prior level of functioning.    Time 4   Period Weeks   Status On-going                  Plan - 05/02/15 1446    Clinical Impression Statement A: Added A/ROM in sitting, pt completed A/ROM in supine with min assist from OT during last 2 repetitions of flexion to initiate movement. Added therapy ball circles, pt with min difficulty. Verbal cuing for form during exercises required.    Plan P: Add scapular theraband, x to v arms.         Problem List Patient Active Problem List   Diagnosis Date Noted  . Palpitations 07/26/2011   Guadelupe Sabin, OTR/L  [phone]  05/02/2015, 2:48 PM  [place] [address] [place], [place], [address] Phone: [phone]   Fax:  [phone]  Name: JAMEEKA MARCY MRN: [id number] Date of Birth: 1975/04/16

## 2015-05-04 ENCOUNTER — Encounter (HOSPITAL_COMMUNITY): Payer: Self-pay | Admitting: Occupational Therapy

## 2015-05-04 ENCOUNTER — Ambulatory Visit (HOSPITAL_COMMUNITY): Payer: 59 | Admitting: Occupational Therapy

## 2015-05-04 DIAGNOSIS — M25512 Pain in left shoulder: Secondary | ICD-10-CM | POA: Diagnosis not present

## 2015-05-04 DIAGNOSIS — M6281 Muscle weakness (generalized): Secondary | ICD-10-CM

## 2015-05-04 DIAGNOSIS — M6289 Other specified disorders of muscle: Secondary | ICD-10-CM

## 2015-05-04 DIAGNOSIS — M75112 Incomplete rotator cuff tear or rupture of left shoulder, not specified as traumatic: Secondary | ICD-10-CM | POA: Diagnosis not present

## 2015-05-04 DIAGNOSIS — M629 Disorder of muscle, unspecified: Secondary | ICD-10-CM | POA: Diagnosis not present

## 2015-05-04 NOTE — Therapy (Signed)
[place], [place], [address] Phone: [phone]   Fax:  [phone]  Occupational Therapy Treatment  Patient Details  Name: [name] MRN: [id number] Date of Birth: 31-Oct-1975 Referring Provider: Dr. Delilah Shan  Encounter Date: 05/04/2015      OT End of Session - 05/04/15 1018    Visit Number 4   Number of Visits 8   Date for OT Re-Evaluation 06/26/15  Mini reassessment 05/27/14   Authorization Type [employer] UMR $20 copay, no visit limit   OT Start Time 0935   OT Stop Time 1015   OT Time Calculation (min) 40 min   Activity Tolerance Patient tolerated treatment well   Behavior During Therapy [facility] for tasks assessed/performed      Past Medical History  Diagnosis Date  . GERD (gastroesophageal reflux disease)   . Palpitations   . PONV (postoperative nausea and vomiting)   . Spinal headache   . Back pain     Past Surgical History  Procedure Laterality Date  . Cesarean section    . Left knee arthroscopic surgery    . Wisdom tooth extraction      There were no vitals filed for this visit.  Visit Diagnosis:  Pain in left shoulder  Muscle weakness of left upper extremity  Tight fascia      Subjective Assessment - 05/04/15 0936    Subjective  S:    Currently in Pain? No/denies            [facility] OT Assessment - 05/04/15 1017    Assessment   Diagnosis left partial rotator cuff tear   Precautions   Precautions Shoulder   Type of Shoulder Precautions no lifting over 10#                  OT Treatments/Exercises (OP) - 05/04/15 0951    Exercises   Exercises Shoulder   Shoulder Exercises: Supine   Protraction PROM;5 reps;AROM;12 reps   Horizontal ABduction PROM;5 reps;AROM;12 reps   External Rotation PROM;5 reps;AROM;12 reps   External Rotation Limitations completed in abduction   Internal Rotation PROM;5 reps;AROM;12 reps   Internal Rotation Limitations completed in abduction   Flexion PROM;5 reps;AROM;12 reps   ABduction PROM;5 reps;AROM;12 reps   Shoulder Exercises: Seated   Protraction AROM;10 reps   Horizontal ABduction AROM;10 reps   External Rotation AROM;10 reps   Internal Rotation AROM;10 reps   Flexion AROM;10 reps   Abduction AROM;10 reps   Shoulder Exercises: Standing   Extension Theraband;10 reps   Theraband Level (Shoulder Extension) Level 2 (Red)   Row Theraband;10 reps   Theraband Level (Shoulder Row) Level 2 (Red)   Retraction Theraband;10 reps   Theraband Level (Shoulder Retraction) Level 2 (Red)   Shoulder Exercises: ROM/Strengthening   UBE (Upper Arm Bike) Level 1, 2' forward 2' reverse  cuing to keep at 3.0-4.0   X to V Arms 10X   Proximal Shoulder Strengthening, Supine 10X each no rest breaks   Proximal Shoulder Strengthening, Seated 10X each no rest breaks   Shoulder Exercises: Stretch   Internal Rotation Stretch 3 reps  10" each   Manual Therapy   Manual Therapy Myofascial release   Manual therapy comments manual therapy completed prior to therapeutic exercises   Myofascial Release Myofascial release to left upper arm, trapezius, and scapularis regions to decrease pain and fascial restrictions and increase joint mobility  OT Short Term Goals - 04/28/15 1605    OT SHORT TERM GOAL #1   Title Pt will be educated on and independent in [place].    Time 4   Period Weeks   Status On-going   OT SHORT TERM GOAL #2   Title Pt will return to prior level of functioning and independence in ADL and work tasks.    Time 4   Period Weeks   Status On-going   OT SHORT TERM GOAL #3   Title Pt will decrease fascial restrictions in LUE from max to mod amounts or less.    Time 4   Period Weeks   Status On-going   OT SHORT TERM GOAL #4   Title Pt will decrease pain to 1/10 or less during daily tasks to increase ability to donn bra.    Status On-going   OT SHORT TERM GOAL #5   Title Pt will increase strength to 5/5 to  increase ability to perform work and household tasks at her prior level of functioning.    Time 4   Period Weeks   Status On-going                  Plan - 05/04/15 1018    Clinical Impression Statement A: Added scapular theraband, UBE, x to v arms this session, pt required verbal cuing for form. Pt reports no pain today, HEP is going well. Pt continues to be unable to complete ER/IR abducted in sitting.    Plan P: Add w arms, sleeper stretch, provide scapular theraband HEP.         Problem List Patient Active Problem List   Diagnosis Date Noted  . Palpitations 07/26/2011    [name], OTR/L  [phone]  05/04/2015, 10:19 AM  [facility] [address] [place], [place], [address] Phone: [phone]   Fax:  [phone]  Name: [name] MRN: [id number] Date of Birth: 12/24/1975

## 2015-05-09 ENCOUNTER — Encounter (HOSPITAL_COMMUNITY): Payer: Self-pay | Admitting: Occupational Therapy

## 2015-05-09 ENCOUNTER — Ambulatory Visit (HOSPITAL_COMMUNITY): Payer: 59 | Admitting: Occupational Therapy

## 2015-05-09 DIAGNOSIS — M25512 Pain in left shoulder: Secondary | ICD-10-CM | POA: Diagnosis not present

## 2015-05-09 DIAGNOSIS — M6281 Muscle weakness (generalized): Secondary | ICD-10-CM

## 2015-05-09 DIAGNOSIS — M629 Disorder of muscle, unspecified: Secondary | ICD-10-CM

## 2015-05-09 DIAGNOSIS — M6289 Other specified disorders of muscle: Secondary | ICD-10-CM

## 2015-05-09 DIAGNOSIS — M75112 Incomplete rotator cuff tear or rupture of left shoulder, not specified as traumatic: Secondary | ICD-10-CM | POA: Diagnosis not present

## 2015-05-09 NOTE — Therapy (Signed)
[place], [place], [address] Phone: [phone]   Fax:  [phone]  Occupational Therapy Treatment  Patient Details  Name: [name] MRN: [id number] Date of Birth: 07/18/1975 Referring Provider: Dr. Delilah Shan  Encounter Date: 05/09/2015      OT End of Session - 05/09/15 1613    Visit Number 5   Number of Visits 8   Date for OT Re-Evaluation 06/26/15  Mini reassessment 05/27/14   Authorization Type U[employer] UMR $20 copay, no visit limit   OT Start Time 1301   OT Stop Time 1346   OT Time Calculation (min) 45 min   Activity Tolerance Patient tolerated treatment well   Behavior During Therapy [facility] for tasks assessed/performed      Past Medical History  Diagnosis Date  . GERD (gastroesophageal reflux disease)   . Palpitations   . PONV (postoperative nausea and vomiting)   . Spinal headache   . Back pain     Past Surgical History  Procedure Laterality Date  . Cesarean section    . Left knee arthroscopic surgery    . Wisdom tooth extraction      There were no vitals filed for this visit.  Visit Diagnosis:  Pain in left shoulder  Muscle weakness of left upper extremity  Tight fascia      Subjective Assessment - 05/09/15 1302    Subjective  S: I still have trouble getting that arm to go down with this exercise. (IR when arm is abducted)   Currently in Pain? Yes   Pain Score 1    Pain Location Shoulder   Pain Orientation Left   Pain Descriptors / Indicators Sore   Pain Type Chronic pain   Pain Onset More than a month ago   Pain Frequency Intermittent   Aggravating Factors  use, lifting   Pain Relieving Factors rest, stretching   Effect of Pain on Daily Activities limited ability to complete daily tasks using left arm.    Multiple Pain Sites No            [facility] OT Assessment - 05/09/15 1301    Assessment   Diagnosis left partial rotator cuff tear   Precautions   Precautions Shoulder   Type of Shoulder Precautions no lifting over 10#                  OT Treatments/Exercises (OP) - 05/09/15 1317    Exercises   Exercises Shoulder   Shoulder Exercises: Supine   Protraction PROM;5 reps;AROM;12 reps   Horizontal ABduction PROM;5 reps;AROM;12 reps   External Rotation PROM;5 reps;AROM;12 reps   External Rotation Limitations completed in abduction   Internal Rotation PROM;5 reps;AROM;12 reps   Internal Rotation Limitations completed in abduction   Flexion PROM;5 reps;AROM;12 reps   ABduction PROM;5 reps;AROM;12 reps   Shoulder Exercises: Seated   Protraction AROM;12 reps   Horizontal ABduction AROM;12 reps   External Rotation AROM;12 reps   External Rotation Limitations completed in abduction   Internal Rotation AROM;12 reps   Internal Rotation Limitations completed in abduction   Flexion AROM;12 reps   Abduction AROM;12 reps   Shoulder Exercises: Standing   Extension Theraband;15 reps   Theraband Level (Shoulder Extension) Level 2 (Red)   Row Theraband;15 reps   Theraband Level (Shoulder Row) Level 2 (Red)   Retraction Theraband;15 reps   Theraband Level (Shoulder Retraction) Level 2 (Red)   Shoulder Exercises: ROM/Strengthening   Over Head Lace 2'   "  W" Arms 10X    X to V Arms 12X   Proximal Shoulder Strengthening, Supine 12X each no rest breaks   Proximal Shoulder Strengthening, Seated 12X each no rest breaks   Rhythmic Stabilization, Seated shoulder flexed to 90 and 120 degrees, 25X each, min difficulty stabilizing shoulder          Manual Therapy   Manual Therapy Myofascial release   Manual therapy comments manual therapy completed prior to therapeutic exercises   Myofascial Release Myofascial release to left upper arm, trapezius, and scapularis regions to decrease pain and fascial restrictions and increase joint mobility                   OT Short Term Goals - 04/28/15 1605    OT SHORT TERM GOAL #1   Title Pt will be educated on  and independent in HEP.    Time 4   Period Weeks   Status On-going   OT SHORT TERM GOAL #2   Title Pt will return to prior level of functioning and independence in ADL and work tasks.    Time 4   Period Weeks   Status On-going   OT SHORT TERM GOAL #3   Title Pt will decrease fascial restrictions in LUE from max to mod amounts or less.    Time 4   Period Weeks   Status On-going   OT SHORT TERM GOAL #4   Title Pt will decrease pain to 1/10 or less during daily tasks to increase ability to donn bra.    Status On-going   OT SHORT TERM GOAL #5   Title Pt will increase strength to 5/5 to increase ability to perform work and household tasks at her prior level of functioning.    Time 4   Period Weeks   Status On-going                  Plan - 05/09/15 1613    Clinical Impression Statement A: Added w arms, overhead lacing, provided scapular theraband HEP. Pt completed sleeper stretch with no difficulties, minimal stretch felt. Pt was able to complete ER/IR in sitting with arm in abduction today for first time. Verbal cuing for form during exercises.    Plan P: Add ball on wall, wall push-ups if able to tolerate. Follow up on HEP.         Problem List Patient Active Problem List   Diagnosis Date Noted  . Palpitations 07/26/2011    Guadelupe Sabin, OTR/L  [phone]  05/09/2015, 4:17 PM  [facility] [address] [place], [place], [address] Phone: [phone]   Fax:  [phone]  Name: [name] MRN: [id number] Date of Birth: 05-09-1975

## 2015-05-09 NOTE — Patient Instructions (Signed)

## 2015-05-11 ENCOUNTER — Ambulatory Visit (HOSPITAL_COMMUNITY): Payer: 59 | Admitting: Occupational Therapy

## 2015-05-11 ENCOUNTER — Encounter (HOSPITAL_COMMUNITY): Payer: Self-pay | Admitting: Occupational Therapy

## 2015-05-11 DIAGNOSIS — M629 Disorder of muscle, unspecified: Secondary | ICD-10-CM | POA: Diagnosis not present

## 2015-05-11 DIAGNOSIS — M6289 Other specified disorders of muscle: Secondary | ICD-10-CM

## 2015-05-11 DIAGNOSIS — M25512 Pain in left shoulder: Secondary | ICD-10-CM | POA: Diagnosis not present

## 2015-05-11 DIAGNOSIS — M75112 Incomplete rotator cuff tear or rupture of left shoulder, not specified as traumatic: Secondary | ICD-10-CM | POA: Diagnosis not present

## 2015-05-11 DIAGNOSIS — M6281 Muscle weakness (generalized): Secondary | ICD-10-CM | POA: Diagnosis not present

## 2015-05-11 NOTE — Therapy (Signed)
[place], [place], [address] Phone: [phone]   Fax:  [phone]  Occupational Therapy Treatment  Patient Details  Name: [name] MRN: [id number] Date of Birth: [birth year] Referring Provider: Dr. Delilah Shan  Encounter Date: 05/11/2015      OT End of Session - 05/11/15 1202    Visit Number 6   Number of Visits 8   Date for OT Re-Evaluation 06/26/15  Mini reassessment 05/27/14   Authorization Type [employer] UMR $20 copay, no visit limit   OT Start Time 0930   OT Stop Time 1015   OT Time Calculation (min) 45 min   Activity Tolerance Patient tolerated treatment well   Behavior During Therapy [facility] for tasks assessed/performed      Past Medical History  Diagnosis Date  . GERD (gastroesophageal reflux disease)   . Palpitations   . PONV (postoperative nausea and vomiting)   . Spinal headache   . Back pain     Past Surgical History  Procedure Laterality Date  . Cesarean section    . Left knee arthroscopic surgery    . Wisdom tooth extraction      There were no vitals filed for this visit.  Visit Diagnosis:  Pain in left shoulder  Muscle weakness of left upper extremity  Tight fascia      Subjective Assessment - 05/11/15 0929    Subjective  S: I've been hurting since yesterday afternoon.    Currently in Pain? Yes   Pain Score 4    Pain Location Shoulder   Pain Orientation Left   Pain Descriptors / Indicators Sore   Pain Type Chronic pain   Pain Onset More than a month ago   Pain Frequency Intermittent   Aggravating Factors  jarring it (hitting bumps in car)   Pain Relieving Factors rest, stretching   Effect of Pain on Daily Activities Limiting ability to use left arm during daily tasks.    Multiple Pain Sites No            [facility] OT Assessment - 05/11/15 0929    Assessment   Diagnosis left partial rotator cuff tear   Precautions   Precautions Shoulder   Type of Shoulder Precautions no  lifting over 10#                  OT Treatments/Exercises (OP) - 05/11/15 0932    Exercises   Exercises Shoulder   Shoulder Exercises: Supine   Protraction PROM;5 reps;AROM;12 reps   Horizontal ABduction PROM;5 reps;AROM;12 reps   External Rotation PROM;5 reps;AROM;12 reps   External Rotation Limitations completed in abduction   Internal Rotation PROM;5 reps;AROM;12 reps   Internal Rotation Limitations completed in abduction   Flexion PROM;5 reps;AROM;12 reps   ABduction PROM;5 reps;AROM;12 reps   Shoulder Exercises: Seated   Protraction AROM;12 reps   Horizontal ABduction AROM;12 reps   External Rotation AROM;12 reps   External Rotation Limitations completed in abduction   Internal Rotation AROM;12 reps   Internal Rotation Limitations completed in abduction   Flexion AROM;12 reps   Abduction AROM;12 reps   Shoulder Exercises: ROM/Strengthening   Wall Wash 1'30"   Wall Pushups 10 reps   X to V Arms 12X   Proximal Shoulder Strengthening, Supine 12X each no rest breaks   Proximal Shoulder Strengthening, Seated 12X each no rest breaks   Modalities   Modalities Moist Heat   Moist Heat Therapy   Number Minutes Moist Heat  10 Minutes   Moist Heat Location Shoulder   Manual Therapy   Manual Therapy Myofascial release   Manual therapy comments manual therapy completed prior to therapeutic exercises   Myofascial Release Myofascial release to left upper arm, trapezius, and scapularis regions to decrease pain and fascial restrictions and increase joint mobility                   OT Short Term Goals - 04/28/15 1605    OT SHORT TERM GOAL #1   Title Pt will be educated on and independent in HEP.    Time 4   Period Weeks   Status On-going   OT SHORT TERM GOAL #2   Title Pt will return to prior level of functioning and independence in ADL and work tasks.    Time 4   Period Weeks   Status On-going   OT SHORT TERM GOAL #3   Title Pt will decrease fascial  restrictions in LUE from max to mod amounts or less.    Time 4   Period Weeks   Status On-going   OT SHORT TERM GOAL #4   Title Pt will decrease pain to 1/10 or less during daily tasks to increase ability to donn bra.    Status On-going   OT SHORT TERM GOAL #5   Title Pt will increase strength to 5/5 to increase ability to perform work and household tasks at her prior level of functioning.    Time 4   Period Weeks   Status On-going                  Plan - 05/11/15 1202    Clinical Impression Statement A: Added wall push-ups this session. Pt reports increased pain at beginning of session, pain began yesterday and pt was unable to sleep comfortably with no pain relief. Pt required rest breaks during session, moist heat applied at end of session & pt reports no pain after moist heat application.    Plan P: Resume missed exercises, add ball on wall.         Problem List Patient Active Problem List   Diagnosis Date Noted  . Palpitations 07/26/2011    Guadelupe Sabin, OTR/L  [phone]  05/11/2015, 12:05 PM  [facility] [address] [place], [place], [address] Phone: [phone]   Fax:  [phone]  Name: [name] MRN: [id number] Date of Birth: 1976-01-26

## 2015-05-16 ENCOUNTER — Ambulatory Visit (HOSPITAL_COMMUNITY): Payer: 59 | Admitting: Occupational Therapy

## 2015-05-18 ENCOUNTER — Encounter (HOSPITAL_COMMUNITY): Payer: Self-pay | Admitting: Occupational Therapy

## 2015-05-18 ENCOUNTER — Ambulatory Visit (HOSPITAL_COMMUNITY): Payer: 59 | Attending: Sports Medicine | Admitting: Occupational Therapy

## 2015-05-18 DIAGNOSIS — M6281 Muscle weakness (generalized): Secondary | ICD-10-CM | POA: Diagnosis not present

## 2015-05-18 DIAGNOSIS — M629 Disorder of muscle, unspecified: Secondary | ICD-10-CM | POA: Insufficient documentation

## 2015-05-18 DIAGNOSIS — M6289 Other specified disorders of muscle: Secondary | ICD-10-CM

## 2015-05-18 DIAGNOSIS — M25512 Pain in left shoulder: Secondary | ICD-10-CM | POA: Diagnosis not present

## 2015-05-18 NOTE — Patient Instructions (Signed)
Strengthening: Chest Pull - Resisted   Hold Theraband in front of body with hands about shoulder width a part. Pull band a part and back together slowly. Repeat __10-15__ times. Complete _1-2___ set(s) per session.  http://orth.exer.us/926   Copyright  VHI. All rights reserved.   PNF Strengthening: Resisted   Standing with resistive band around each hand, bring right arm up and away, thumb back. Repeat _10-15___ times per set. Do _1-2___ sets per session.   http://orth.exer.us/918   Copyright  VHI. All rights reserved.   PNF Strengthening: Resisted   Standing with resistive band around each hand, bring right arm up and across body. Repeat _10-15___ times per set. Do _1-2___ sets per session.   http://orth.exer.us/920   Copyright  VHI. All rights reserved.    Resisted External Rotation: in Neutral - Bilateral   Sit or stand, tubing in both hands, elbows at sides, bent to 90, forearms forward. Pinch shoulder blades together and rotate forearms out. Keep elbows at sides. Repeat __10-15__ times per set. Do _1-2___ sets per session.   http://orth.exer.us/966   Copyright  VHI. All rights reserved.   PNF Strengthening: Resisted   Standing, hold resistive band above head. Bring right arm down and out from side. Repeat _10-15___ times per set. Do _1-2___ sets per session.   http://orth.exer.us/922   Copyright  VHI. All rights reserved.

## 2015-05-18 NOTE — Therapy (Signed)
[facility] [place], [place], [address] Phone: [phone]   Fax:  [phone]  Occupational Therapy Reassessment, Treatment, Discharge Summary  Patient Details  Name: [name] MRN: [id number] Date of Birth: Jul 11, 1975 Referring Provider: Dr. Delilah Shan  Encounter Date: 05/18/2015      OT End of Session - 05/18/15 1212    Visit Number 7   Number of Visits 8   Date for OT Re-Evaluation 06/26/15  Mini reassessment 05/27/14   Authorization Type U[employer] UMR $20 copay, no visit limit   OT Start Time 0934   OT Stop Time 1015   OT Time Calculation (min) 41 min   Activity Tolerance Patient tolerated treatment well   Behavior During Therapy [facility] for tasks assessed/performed      Past Medical History  Diagnosis Date  . GERD (gastroesophageal reflux disease)   . Palpitations   . PONV (postoperative nausea and vomiting)   . Spinal headache   . Back pain     Past Surgical History  Procedure Laterality Date  . Cesarean section    . Left knee arthroscopic surgery    . Wisdom tooth extraction      There were no vitals filed for this visit.  Visit Diagnosis:  Pain in left shoulder  Muscle weakness of left upper extremity  Tight fascia      Subjective Assessment - 05/18/15 0934    Subjective  S: My shoulder feels so much better than it did when I first came.    Currently in Pain? Yes   Pain Score 1    Pain Location Shoulder   Pain Orientation Left   Pain Descriptors / Indicators Sore   Pain Type Chronic pain   Pain Onset More than a month ago   Pain Frequency Intermittent   Aggravating Factors  jarring, certain movements   Pain Relieving Factors rest, stretching   Effect of Pain on Daily Activities limiting ability to use left arm during daily tasks.            [facility] OT Assessment - 05/18/15 0934    Assessment   Diagnosis left partial rotator cuff tear   Precautions   Precautions Shoulder   Type of  Shoulder Precautions no lifting over 10#   Palpation   Palpation comment Mod fascial restrictions in left upper arm, trapezius, and scapularis regions   AROM   Overall AROM Comments Assessed seated, ER/IR adducted   AROM Assessment Site Shoulder   Right/Left Shoulder Left   Left Shoulder Flexion 180 Degrees  162 previous   Left Shoulder ABduction 180 Degrees  160 previous   Left Shoulder Internal Rotation 90 Degrees  same as previous   Left Shoulder External Rotation 78 Degrees  66 previous   PROM   Overall PROM Comments P/ROM is WNL   PROM Assessment Site Shoulder   Right/Left Shoulder Left   Strength   Overall Strength Comments Assessed seated, ER/IR adducted   Strength Assessment Site Shoulder   Right/Left Shoulder Left   Left Shoulder Flexion 5/5  previous 4/5   Left Shoulder ABduction 5/5  4/5 previous   Left Shoulder Internal Rotation 5/5  4+/5 previous   Left Shoulder External Rotation 5/5  4+/5 previous                   OT Treatments/Exercises (OP) - 05/18/15 0936    Exercises   Exercises Shoulder   Shoulder Exercises: Supine   Protraction PROM;5 reps  Horizontal ABduction PROM;5 reps   External Rotation PROM;5 reps   Internal Rotation PROM;5 reps   Flexion PROM;5 reps   ABduction PROM;5 reps   Shoulder Exercises: Seated   Other Seated Exercises Green theraband exercises: horizontal ab/adduction, ER/IR, PNF pattern exercises 10X each   Shoulder Exercises: Standing   Extension Theraband;10 reps   Theraband Level (Shoulder Extension) Level 3 (Green)   Row [employer] reps   Theraband Level (Shoulder Row) Level 3 (Green)   Retraction Theraband;10 reps   Theraband Level (Shoulder Retraction) Level 3 (Green)   Shoulder Exercises: ROM/Strengthening   "W" Arms 10X    Ball on Wall 1' flexion 1' abduction   Manual Therapy   Manual Therapy Myofascial release   Manual therapy comments manual therapy completed prior to therapeutic exercises    Myofascial Release Myofascial release to left upper arm, trapezius, and scapularis regions to decrease pain and fascial restrictions and increase joint mobility                OT Education - 05/18/15 1017    Education provided Yes   Education Details Green theraband strengthening exercises; educated pt on adding 1# weight to A/ROM exercises   Person(s) Educated Patient   Methods Explanation;Demonstration;Handout   Comprehension Verbalized understanding;Returned demonstration          OT Short Term Goals - 05/18/15 1039    OT SHORT TERM GOAL #1   Title Pt will be educated on and independent in HEP.    Time 4   Period Weeks   Status Achieved   OT SHORT TERM GOAL #2   Title Pt will return to prior level of functioning and independence in ADL and work tasks.    Time 4   Period Weeks   Status Achieved   OT SHORT TERM GOAL #3   Title Pt will decrease fascial restrictions in LUE from max to mod amounts or less.    Time 4   Period Weeks   Status Achieved   OT SHORT TERM GOAL #4   Title Pt will decrease pain to 1/10 or less during daily tasks to increase ability to donn bra.    Status Achieved   OT SHORT TERM GOAL #5   Title Pt will increase strength to 5/5 to increase ability to perform work and household tasks at her prior level of functioning.    Time 4   Period Weeks   Status Achieved                  Plan - 05/18/15 1212    Clinical Impression Statement A: Mini-reassessment completed this session, pt has met 5/5 goals and is at prior level of functioning and independence in daily tasks. Pt does have pain at times with increased use. Added ball on wall, upgraded to green scapular theraband. Pt is agreeable to discharge with updated HEP and continue strengthening at home.    Plan P: Discharge pt.         Problem List Patient Active Problem List   Diagnosis Date Noted  . Palpitations 07/26/2011    Guadelupe Stevenson, OTR/L  [phone]  05/18/2015, 12:15  PM  [facility] [address] [place], [place], [address] Phone: [phone]   Fax:  [phone]  Name: [name] MRN: [id number] Date of Birth: [birth year]    OCCUPATIONAL THERAPY DISCHARGE SUMMARY  Visits from Start of Care: 7  Current functional level related to goals / functional outcomes: See  above. Pt reports she is at prior level of functioning and independence in daily and work tasks. She is now able to fasten her bra with no difficulty and perform daily & work tasks with less discomfort.    Remaining deficits: Pt continues to have minimal pain with increased use of LUE, at time experiences fatigue.    Education / Equipment: Pt provided with green theraband and green scapular theraband for continued strengthening at home. Instructed pt to add weight to A/ROM exercises when she feels is appropriate, beginning with 1# and increasing as is able to tolerate.   Plan: Patient agrees to discharge.  Patient goals were met. Patient is being discharged due to meeting the stated rehab goals.  ?????

## 2015-06-15 DIAGNOSIS — M25512 Pain in left shoulder: Secondary | ICD-10-CM | POA: Diagnosis not present

## 2015-06-19 MED FILL — PANTOPRAZOLE SOD DR 40 MG T: 40 | 90 days supply | Qty: 90 | Fill #1

## 2015-06-19 MED FILL — ESCITALOPRAM 20 MG TABLET: 20 | 30 days supply | Qty: 45 | Fill #2

## 2015-06-23 DIAGNOSIS — M542 Cervicalgia: Secondary | ICD-10-CM | POA: Diagnosis not present

## 2015-06-23 DIAGNOSIS — M25512 Pain in left shoulder: Secondary | ICD-10-CM | POA: Diagnosis not present

## 2015-06-23 MED FILL — predniSONE 10 MG TABS: 10 | 6 days supply | Qty: 21 | Fill #0

## 2015-06-23 MED FILL — FLECTOR 1.3% PATCH: 1.3 | 30 days supply | Qty: 30 | Fill #0

## 2015-07-04 MED FILL — OSELTAMIVIR PHOS 75 MG CAP: 75 | 10 days supply | Qty: 10 | Fill #0

## 2015-08-21 MED FILL — ESCITALOPRAM 20 MG TABLET: 20 | 30 days supply | Qty: 45 | Fill #0

## 2015-09-29 MED FILL — PANTOPRAZOLE SOD DR 40 MG T: 40 | 90 days supply | Qty: 90 | Fill #2

## 2015-09-29 MED FILL — ESCITALOPRAM 20 MG TABLET: 20 | 30 days supply | Qty: 45 | Fill #1

## 2015-11-20 MED FILL — ESCITALOPRAM 20 MG TABLET: 20 | 30 days supply | Qty: 45 | Fill #2

## 2015-12-27 MED FILL — PANTOPRAZOLE SOD DR 40 MG T: 40 | 90 days supply | Qty: 90 | Fill #3

## 2016-01-09 MED FILL — ESCITALOPRAM 20 MG TABLET: 20 | 30 days supply | Qty: 45 | Fill #3

## 2016-02-29 MED FILL — ESCITALOPRAM 20 MG TABLET: 20 | 30 days supply | Qty: 45 | Fill #4

## 2016-03-20 DIAGNOSIS — Z6841 Body Mass Index (BMI) 40.0 and over, adult: Secondary | ICD-10-CM | POA: Diagnosis not present

## 2016-03-20 DIAGNOSIS — J0100 Acute maxillary sinusitis, unspecified: Secondary | ICD-10-CM | POA: Diagnosis not present

## 2016-03-20 DIAGNOSIS — Z1389 Encounter for screening for other disorder: Secondary | ICD-10-CM | POA: Diagnosis not present

## 2016-03-20 DIAGNOSIS — R070 Pain in throat: Secondary | ICD-10-CM | POA: Diagnosis not present

## 2016-03-20 DIAGNOSIS — J343 Hypertrophy of nasal turbinates: Secondary | ICD-10-CM | POA: Diagnosis not present

## 2016-03-20 MED FILL — AMOX-CLAV 875-125 MG TABLET: 875-125 | 10 days supply | Qty: 20 | Fill #0

## 2016-03-28 MED FILL — ESCITALOPRAM 20 MG TABLET: 20 | 30 days supply | Qty: 45 | Fill #5

## 2016-04-04 MED FILL — PANTOPRAZOLE SOD DR 40 MG T: 40 | 90 days supply | Qty: 90 | Fill #0

## 2016-04-29 MED FILL — ESCITALOPRAM 20 MG TABLET: 20 | 30 days supply | Qty: 45 | Fill #0

## 2016-05-30 DIAGNOSIS — Z130 Encounter for screening for diseases of the blood and blood-forming organs and certain disorders involving the immune mechanism: Secondary | ICD-10-CM | POA: Diagnosis not present

## 2016-05-30 DIAGNOSIS — Z6841 Body Mass Index (BMI) 40.0 and over, adult: Secondary | ICD-10-CM | POA: Diagnosis not present

## 2016-05-30 DIAGNOSIS — Z1279 Encounter for screening for malignant neoplasm of other genitourinary organs: Secondary | ICD-10-CM | POA: Diagnosis not present

## 2016-05-30 DIAGNOSIS — Z1231 Encounter for screening mammogram for malignant neoplasm of breast: Secondary | ICD-10-CM | POA: Diagnosis not present

## 2016-05-30 DIAGNOSIS — Z01419 Encounter for gynecological examination (general) (routine) without abnormal findings: Secondary | ICD-10-CM | POA: Diagnosis not present

## 2016-06-03 MED FILL — OSELTAMIVIR PHOSPHATE 75 MG: 75 | 10 days supply | Qty: 10 | Fill #0

## 2016-06-06 DIAGNOSIS — M25512 Pain in left shoulder: Secondary | ICD-10-CM | POA: Diagnosis not present

## 2016-06-06 DIAGNOSIS — G8929 Other chronic pain: Secondary | ICD-10-CM | POA: Diagnosis not present

## 2016-06-06 DIAGNOSIS — M542 Cervicalgia: Secondary | ICD-10-CM | POA: Diagnosis not present

## 2016-06-06 MED FILL — predniSONE 10 MG TABS: 10 | 6 days supply | Qty: 21 | Fill #0

## 2016-06-06 MED FILL — METHOCARBAMOL 500 MG TABLET: 500 | 10 days supply | Qty: 30 | Fill #0

## 2016-10-03 MED FILL — PANTOPRAZOLE SOD DR 40 MG T: 40 | 90 days supply | Qty: 90 | Fill #1

## 2016-10-04 MED FILL — ESCITALOPRAM 20 MG TABLET: 20 | 30 days supply | Qty: 45 | Fill #0

## 2016-12-25 MED FILL — ESCITALOPRAM 20 MG TABLET: 20 | 30 days supply | Qty: 45 | Fill #1

## 2017-02-10 MED FILL — ESCITALOPRAM 20 MG TABLET: 20 | 30 days supply | Qty: 45 | Fill #2

## 2017-02-10 MED FILL — PANTOPRAZOLE SOD DR 40 MG T: 40 | 90 days supply | Qty: 90 | Fill #2

## 2017-04-14 MED FILL — ESCITALOPRAM 20 MG TABLET: 20 | 30 days supply | Qty: 45 | Fill #3

## 2017-04-30 DIAGNOSIS — H524 Presbyopia: Secondary | ICD-10-CM | POA: Diagnosis not present

## 2017-06-11 MED FILL — ESCITALOPRAM 20 MG TABLET: 20 | 30 days supply | Qty: 45 | Fill #0

## 2017-06-11 MED FILL — PANTOPRAZOLE SOD DR 40 MG T: 40 | 30 days supply | Qty: 30 | Fill #0

## 2017-06-13 ENCOUNTER — Ambulatory Visit (INDEPENDENT_AMBULATORY_CARE_PROVIDER_SITE_OTHER): Payer: Self-pay | Admitting: Nurse Practitioner

## 2017-06-13 ENCOUNTER — Encounter: Payer: Self-pay | Admitting: Nurse Practitioner

## 2017-06-13 VITALS — BP 100/72 | HR 98 | Temp 98.3°F | Wt 328.6 lb

## 2017-06-13 DIAGNOSIS — J019 Acute sinusitis, unspecified: Secondary | ICD-10-CM

## 2017-06-13 MED ORDER — AMOXICILLIN-POT CLAVULANATE 875-125 MG PO TABS: 1.0000 | ORAL_TABLET | Freq: Two times a day (BID) | ORAL | 0 refills | Status: AC

## 2017-06-13 MED ORDER — FLUTICASONE PROPIONATE 50 MCG/ACT NA SUSP: 2.0000 | Freq: Every day | NASAL | 0 refills | Status: DC

## 2017-06-13 MED FILL — AMOXICILLIN-POT CLAVULANATE: 875-125 | 10 days supply | Qty: 20 | Fill #0

## 2017-06-13 MED FILL — FLUTICASONE PROP 50 MCG SPR: 50 | 30 days supply | Qty: 16 | Fill #0

## 2017-06-13 NOTE — Patient Instructions (Addendum)

## 2017-06-13 NOTE — Progress Notes (Signed)
Subjective:  [name] is a 42 y.o. female who presents for evaluation of possible sinusitis.  Symptoms include bilateral ear pressure/pain, headache described as pressure, nasal congestion, no fever, post nasal drip, productive cough with green colored sputum, sinus pressure, sinus pain, sneezing and sore throat.  Onset of symptoms was 1 day ago, and has been rapidly worsening since that time.  Treatment to date:  ibuprofen.  High risk factors for influenza complications:  none.  The following portions of the patient's history were reviewed and updated as appropriate:  allergies, current medications and past medical history.  Constitutional: positive for chills and fatigue, negative for anorexia, fevers and sweats Eyes: negative Ears, nose, mouth, throat, and face: positive for nasal congestion, sore throat and bilateral ear pressure/fullness, negative for ear drainage, earaches and hoarseness Respiratory: positive for cough and sputum, negative for asthma, chronic bronchitis, dyspnea on exertion, stridor and wheezing Cardiovascular: negative Gastrointestinal: negative Neurological: positive for headaches, negative for coordination problems, dizziness, paresthesia and weakness Allergic/Immunologic: negative Objective:  BP 100/72   Pulse 98   Temp 98.3 F (36.8 C)   Wt (!) 328 lb 9.6 oz (149.1 kg)   SpO2 98%   BMI 51.47 kg/m  General appearance: alert, cooperative, fatigued and no distress Head: Normocephalic, without obvious abnormality, atraumatic Eyes: conjunctivae/corneas clear. PERRL, EOM's intact. Fundi benign. Ears: abnormal TM right ear - mucoid middle ear fluid and abnormal TM left ear - mucoid middle ear fluid Nose: clear discharge, moderate congestion, turbinates swollen, inflamed, moderate maxillary sinus tenderness bilateral, moderate frontal sinus tenderness bilateral Throat: lips, mucosa, and tongue normal; teeth and gums normal Lungs: clear to auscultation  bilaterally Heart: regular rate and rhythm, S1, S2 normal, no murmur, click, rub or gallop Abdomen: soft, non-tender; bowel sounds normal; no masses,  no organomegaly Pulses: 2+ and symmetric Skin: Skin color, texture, turgor normal. No rashes or lesions Lymph nodes: cervical and submandibular nodes normal Neurologic: Grossly normal    Assessment:  Acute Sinusitis    Plan:  Discussed diagnosis and treatment of sinusitis. Educational material distributed and questions answered. Suggested symptomatic OTC remedies. Supportive care with appropriate antipyretics and fluids. Nasal saline spray for congestion. Augmentin and Flonase per orders. Nasal steroids per orders. Follow up as needed.

## 2017-07-08 DIAGNOSIS — F329 Major depressive disorder, single episode, unspecified: Secondary | ICD-10-CM | POA: Diagnosis not present

## 2017-07-08 DIAGNOSIS — Z124 Encounter for screening for malignant neoplasm of cervix: Secondary | ICD-10-CM | POA: Diagnosis not present

## 2017-07-08 DIAGNOSIS — Z01419 Encounter for gynecological examination (general) (routine) without abnormal findings: Secondary | ICD-10-CM | POA: Diagnosis not present

## 2017-07-08 DIAGNOSIS — K219 Gastro-esophageal reflux disease without esophagitis: Secondary | ICD-10-CM | POA: Diagnosis not present

## 2017-07-08 DIAGNOSIS — Z1389 Encounter for screening for other disorder: Secondary | ICD-10-CM | POA: Diagnosis not present

## 2017-07-08 DIAGNOSIS — Z6841 Body Mass Index (BMI) 40.0 and over, adult: Secondary | ICD-10-CM | POA: Diagnosis not present

## 2017-07-08 DIAGNOSIS — R7309 Other abnormal glucose: Secondary | ICD-10-CM | POA: Diagnosis not present

## 2017-07-08 DIAGNOSIS — Z1231 Encounter for screening mammogram for malignant neoplasm of breast: Secondary | ICD-10-CM | POA: Diagnosis not present

## 2017-07-08 DIAGNOSIS — Z1151 Encounter for screening for human papillomavirus (HPV): Secondary | ICD-10-CM | POA: Diagnosis not present

## 2017-07-08 DIAGNOSIS — Z130 Encounter for screening for diseases of the blood and blood-forming organs and certain disorders involving the immune mechanism: Secondary | ICD-10-CM | POA: Diagnosis not present

## 2017-07-08 DIAGNOSIS — Z0000 Encounter for general adult medical examination without abnormal findings: Secondary | ICD-10-CM | POA: Diagnosis not present

## 2017-07-08 MED FILL — PANTOPRAZOLE SOD DR 40 MG T: 40 | 90 days supply | Qty: 90 | Fill #0

## 2017-07-08 MED FILL — ESCITALOPRAM 20 MG TABLET: 20 | 90 days supply | Qty: 135 | Fill #0

## 2017-07-09 MED FILL — VIT D3-50 50,000 UNITS CAPS: 1.25 MG | 84 days supply | Qty: 12 | Fill #0

## 2017-07-09 MED FILL — metFORMIN HCL 500 MG TABS: 500 | 30 days supply | Qty: 60 | Fill #0

## 2017-07-18 DIAGNOSIS — E119 Type 2 diabetes mellitus without complications: Secondary | ICD-10-CM | POA: Diagnosis not present

## 2017-07-18 DIAGNOSIS — Z6841 Body Mass Index (BMI) 40.0 and over, adult: Secondary | ICD-10-CM | POA: Diagnosis not present

## 2017-07-18 DIAGNOSIS — Z1389 Encounter for screening for other disorder: Secondary | ICD-10-CM | POA: Diagnosis not present

## 2017-07-18 MED FILL — buPROPion HCL ER (XL) 150 M: 150 | 90 days supply | Qty: 90 | Fill #0

## 2017-08-15 MED FILL — metFORMIN HCL 500 MG TABS: 500 | 90 days supply | Qty: 180 | Fill #0

## 2017-09-19 DIAGNOSIS — M25512 Pain in left shoulder: Secondary | ICD-10-CM | POA: Diagnosis not present

## 2017-10-20 ENCOUNTER — Encounter (HOSPITAL_COMMUNITY): Payer: Self-pay | Admitting: Emergency Medicine

## 2017-10-20 ENCOUNTER — Emergency Department (HOSPITAL_COMMUNITY): Payer: 59

## 2017-10-20 ENCOUNTER — Emergency Department (HOSPITAL_COMMUNITY)
Admission: EM | Admit: 2017-10-20 | Discharge: 2017-10-21 | Disposition: A | Discharge status: Home / Self Care | Payer: 59 | Attending: Emergency Medicine | Admitting: Emergency Medicine

## 2017-10-20 ENCOUNTER — Other Ambulatory Visit: Payer: Self-pay

## 2017-10-20 DIAGNOSIS — N200 Calculus of kidney: Secondary | ICD-10-CM | POA: Diagnosis not present

## 2017-10-20 DIAGNOSIS — Z79899 Other long term (current) drug therapy: Secondary | ICD-10-CM | POA: Diagnosis not present

## 2017-10-20 DIAGNOSIS — Z7982 Long term (current) use of aspirin: Secondary | ICD-10-CM | POA: Insufficient documentation

## 2017-10-20 DIAGNOSIS — I10 Essential (primary) hypertension: Secondary | ICD-10-CM | POA: Diagnosis not present

## 2017-10-20 DIAGNOSIS — I959 Hypotension, unspecified: Secondary | ICD-10-CM | POA: Diagnosis not present

## 2017-10-20 DIAGNOSIS — M5489 Other dorsalgia: Secondary | ICD-10-CM | POA: Diagnosis not present

## 2017-10-20 DIAGNOSIS — Z87891 Personal history of nicotine dependence: Secondary | ICD-10-CM | POA: Insufficient documentation

## 2017-10-20 DIAGNOSIS — R1011 Right upper quadrant pain: Secondary | ICD-10-CM | POA: Diagnosis present

## 2017-10-20 DIAGNOSIS — N23 Unspecified renal colic: Secondary | ICD-10-CM | POA: Insufficient documentation

## 2017-10-20 DIAGNOSIS — N201 Calculus of ureter: Secondary | ICD-10-CM | POA: Diagnosis not present

## 2017-10-20 LAB — URINALYSIS, MICROSCOPIC (REFLEX)

## 2017-10-20 LAB — COMPREHENSIVE METABOLIC PANEL
ALK PHOS: 92 U/L (ref 38–126)
ALT: 29 U/L (ref 0–44)
AST: 23 U/L (ref 15–41)
Albumin: 4.2 g/dL (ref 3.5–5.0)
Anion gap: 10 (ref 5–15)
BUN: 16 mg/dL (ref 6–20)
CHLORIDE: 105 mmol/L (ref 98–111)
CO2: 26 mmol/L (ref 22–32)
CREATININE: 0.88 mg/dL (ref 0.44–1.00)
Calcium: 9.1 mg/dL (ref 8.9–10.3)
GFR calc Af Amer: >60 mL/min (ref >60)
Glucose, Bld: 199 mg/dL — ABNORMAL HIGH (ref 70–99)
Potassium: 4.5 mmol/L (ref 3.5–5.1)
Sodium: 141 mmol/L (ref 135–145)
TOTAL PROTEIN: 7.7 g/dL (ref 6.5–8.1)
Total Bilirubin: 0.6 mg/dL (ref 0.3–1.2)

## 2017-10-20 LAB — PREGNANCY, URINE: PREG TEST UR: NEGATIVE

## 2017-10-20 LAB — URINALYSIS, ROUTINE W REFLEX MICROSCOPIC
Bilirubin Urine: NEGATIVE
Glucose, UA: NEGATIVE mg/dL
Ketones, ur: NEGATIVE mg/dL
Leukocytes, UA: NEGATIVE
NITRITE: NEGATIVE
Protein, ur: NEGATIVE mg/dL
pH: 5.5 (ref 5.0–8.0)

## 2017-10-20 LAB — CBC WITH DIFFERENTIAL/PLATELET
BASOS ABS: 0 10*3/uL (ref 0.0–0.1)
Basophils Relative: 0 %
Eosinophils Absolute: 0 10*3/uL (ref 0.0–0.7)
Eosinophils Relative: 0 %
HEMATOCRIT: 42 % (ref 36.0–46.0)
HEMOGLOBIN: 13.8 g/dL (ref 12.0–15.0)
LYMPHS ABS: 1.7 10*3/uL (ref 0.7–4.0)
LYMPHS PCT: 11 %
MCH: 27.2 pg (ref 26.0–34.0)
MCHC: 32.9 g/dL (ref 30.0–36.0)
MCV: 82.8 fL (ref 78.0–100.0)
Monocytes Absolute: 0.8 10*3/uL (ref 0.1–1.0)
Monocytes Relative: 5 %
NEUTROS ABS: 12.7 10*3/uL — AB (ref 1.7–7.7)
Neutrophils Relative %: 84 %
Platelets: 330 10*3/uL (ref 150–400)
RBC: 5.07 MIL/uL (ref 3.87–5.11)
RDW: 13.9 % (ref 11.5–15.5)
WBC: 15.3 10*3/uL — AB (ref 4.0–10.5)

## 2017-10-20 LAB — LIPASE, BLOOD: LIPASE: 28 U/L (ref 11–51)

## 2017-10-20 MED ORDER — PROCHLORPERAZINE EDISYLATE 10 MG/2ML IJ SOLN
5.0000 mg | Freq: Once | INTRAMUSCULAR | Status: AC
  Administered 2017-10-20: 5 mg via INTRAVENOUS
  Filled 2017-10-20: qty 2

## 2017-10-20 MED ORDER — ONDANSETRON HCL 4 MG/2ML IJ SOLN
4.0000 mg | Freq: Once | INTRAMUSCULAR | Status: AC
  Administered 2017-10-20: 4 mg via INTRAVENOUS
  Filled 2017-10-20: qty 2

## 2017-10-20 MED ORDER — SODIUM CHLORIDE 0.9 % IV BOLUS
1000.0000 mL | Freq: Once | INTRAVENOUS | Status: AC
  Administered 2017-10-20: 1000 mL via INTRAVENOUS

## 2017-10-20 MED ORDER — HYDROMORPHONE HCL 1 MG/ML IJ SOLN
0.5000 mg | Freq: Once | INTRAMUSCULAR | Status: AC
Start: 2017-10-20 — End: 2017-10-20
  Administered 2017-10-20: 0.5 mg via INTRAVENOUS
  Filled 2017-10-20: qty 1

## 2017-10-20 MED ORDER — MORPHINE SULFATE (PF) 4 MG/ML IV SOLN
4.0000 mg | Freq: Once | INTRAVENOUS | Status: AC
  Administered 2017-10-20: 4 mg via INTRAVENOUS
  Filled 2017-10-20: qty 1

## 2017-10-20 MED ORDER — IOPAMIDOL (ISOVUE-300) INJECTION 61%
100.0000 mL | Freq: Once | INTRAVENOUS | Status: AC | PRN
  Administered 2017-10-20: 100 mL via INTRAVENOUS

## 2017-10-20 NOTE — ED Notes (Signed)
Patient transported to CT 

## 2017-10-20 NOTE — ED Triage Notes (Signed)
Pt c/o flank pain and back pain that started today.

## 2017-10-21 DIAGNOSIS — Z7982 Long term (current) use of aspirin: Secondary | ICD-10-CM | POA: Diagnosis not present

## 2017-10-21 DIAGNOSIS — N23 Unspecified renal colic: Secondary | ICD-10-CM | POA: Diagnosis not present

## 2017-10-21 DIAGNOSIS — Z79899 Other long term (current) drug therapy: Secondary | ICD-10-CM | POA: Diagnosis not present

## 2017-10-21 DIAGNOSIS — Z87891 Personal history of nicotine dependence: Secondary | ICD-10-CM | POA: Diagnosis not present

## 2017-10-21 DIAGNOSIS — N200 Calculus of kidney: Secondary | ICD-10-CM | POA: Diagnosis not present

## 2017-10-21 MED ORDER — PROCHLORPERAZINE EDISYLATE 10 MG/2ML IJ SOLN
5.0000 mg | Freq: Once | INTRAMUSCULAR | Status: AC
  Administered 2017-10-21: 5 mg via INTRAVENOUS
  Filled 2017-10-21: qty 2

## 2017-10-21 MED ORDER — MELOXICAM 15 MG PO TABS: 15.0000 mg | ORAL_TABLET | Freq: Every day | ORAL | 0 refills | Status: DC

## 2017-10-21 MED ORDER — KETOROLAC TROMETHAMINE 30 MG/ML IJ SOLN
30.0000 mg | Freq: Once | INTRAMUSCULAR | Status: AC
  Administered 2017-10-21: 30 mg via INTRAVENOUS
  Filled 2017-10-21: qty 1

## 2017-10-21 MED ORDER — CEPHALEXIN 500 MG PO CAPS: 500.0000 mg | ORAL_CAPSULE | Freq: Four times a day (QID) | ORAL | 0 refills | Status: DC

## 2017-10-21 MED ORDER — OXYCODONE-ACETAMINOPHEN 5-325 MG PO TABS: 1.0000 | ORAL_TABLET | Freq: Four times a day (QID) | ORAL | 0 refills | Status: DC | PRN

## 2017-10-21 MED ORDER — HYDROMORPHONE HCL 1 MG/ML IJ SOLN
0.5000 mg | Freq: Once | INTRAMUSCULAR | Status: AC
Start: 2017-10-21 — End: 2017-10-21
  Administered 2017-10-21: 0.5 mg via INTRAVENOUS
  Filled 2017-10-21: qty 1

## 2017-10-21 MED FILL — MELOXICAM 15 MG TABLET: 15 | 7 days supply | Qty: 7 | Fill #0

## 2017-10-21 MED FILL — OXYCODONE-ACETAMINOPHEN 5-3: 5-325 | 4 days supply | Qty: 15 | Fill #0

## 2017-10-21 MED FILL — CEPHALEXIN 500 MG CAPSULE: 500 | 5 days supply | Qty: 20 | Fill #0

## 2017-10-21 NOTE — ED Provider Notes (Signed)
[facility] EMERGENCY DEPARTMENT Provider Note   CSN: [id number] Arrival date & time: 10/20/17  2013     History   Chief Complaint Chief Complaint  Patient presents with  . Flank Pain    HPI [name] is a 42 y.o. female.  Patient is a 42 year old female who presents to the emergency department with complaint of right back and flank area pain.  This problem started earlier today about 5:30 PM..  The patient states she has a sharp severe pain that is in the mid back radiates to the flank area, to the abdomen, and into the bladder area on the right side.  She denies any recent injury or trauma.  She denies any dysuria.  She denies constipation or diarrhea.  She has not been diagnosed with problems with her gallbladder in the past.  She continues to have her appendix.  She states that she has an IUD in place, and her doctors have not been able to find the string recently.  She has not had any unusual vaginal bleeding.  She has had some nausea, but no actual vomiting.  Movement and standing make the pain worse.  The pain feels better if she is sitting on the commode or on a surface where her bottom is not on a solid surface or foundation.     Past Medical History:  Diagnosis Date  . Back pain   . GERD (gastroesophageal reflux disease)   . Palpitations   . PONV (postoperative nausea and vomiting)   . Spinal headache     Patient Active Problem List   Diagnosis Date Noted  . Palpitations 07/26/2011    Past Surgical History:  Procedure Laterality Date  . CESAREAN SECTION    . Left knee arthroscopic surgery    . WISDOM TOOTH EXTRACTION       OB History    Gravida  2   Para  2   Term  2   Preterm      AB      Living  2     SAB      TAB      Ectopic      Multiple      Live Births               Home Medications    Prior to Admission medications   Medication Sig Start Date End Date Taking? Authorizing Provider  aspirin 81 MG tablet Take 81 mg by  mouth daily.    [provider]  cephALEXin (KEFLEX) 500 MG capsule Take 1 capsule (500 mg total) by mouth 4 (four) times daily. 10/21/17   Lily Kocher, PA-C  esomeprazole (NEXIUM) 40 MG capsule Take 40 mg by mouth daily before breakfast.    [provider]  fluticasone (FLONASE) 50 MCG/ACT nasal spray Place 2 sprays into both nostrils daily for 10 days. 06/13/17 06/23/17  Kara Dies, NP  levonorgestrel (MIRENA) 20 MCG/24HR IUD 1 each by Intrauterine route once.    [provider]  meloxicam (MOBIC) 15 MG tablet Take 1 tablet (15 mg total) by mouth daily. 10/21/17   Lily Kocher, PA-C  metoprolol succinate (TOPROL-XL) 50 MG 24 hr tablet Take 25 mg by mouth daily. 1/2 tablet daily 07/26/11   Lelon Perla, MD  oxyCODONE-acetaminophen (PERCOCET/ROXICET) 5-325 MG tablet Take 1 tablet by mouth every 6 (six) hours as needed. 10/21/17   Lily Kocher, PA-C  escitalopram (LEXAPRO) 20 MG tablet Take 20 mg by mouth  daily.  11/14/11  [provider]    Family History Family History  Problem Relation Age of Onset  . Hypertension Mother   . Stroke Mother   . Anesthesia problems Mother   . Anesthesia problems Sister   . Hypertension Father     Social History Social History   Tobacco Use  . Smoking status: Former Smoker    Last attempt to quit: 08/03/2002    Years since quitting: 15.2  . Smokeless tobacco: Never Used  Substance Use Topics  . Alcohol use: No  . Drug use: No     Allergies   Patient has no known allergies.   Review of Systems Review of Systems  Constitutional: Negative for activity change, chills and fever.       All ROS Neg except as noted in HPI  HENT: Negative for nosebleeds.   Eyes: Negative for photophobia and discharge.  Respiratory: Negative for cough, shortness of breath and wheezing.   Cardiovascular: Negative for chest pain and palpitations.  Gastrointestinal: Positive for nausea. Negative for abdominal pain, blood  in stool and vomiting.  Genitourinary: Positive for flank pain. Negative for dysuria, frequency and hematuria.  Musculoskeletal: Positive for back pain. Negative for arthralgias and neck pain.  Skin: Negative.   Neurological: Negative for dizziness, seizures and speech difficulty.  Psychiatric/Behavioral: Negative for confusion and hallucinations.     Physical Exam Updated Vital Signs BP (!) 150/89 (BP Location: Right Arm)   Pulse 84   Temp 98.5 F (36.9 C)   Resp 19   Ht 5\' 7"  (1.702 m)   Wt (!) 147.4 kg (325 lb)   SpO2 100%   BMI 50.90 kg/m   Physical Exam  Constitutional: She is oriented to person, place, and time. She appears well-developed and well-nourished.  Non-toxic appearance.  HENT:  Head: Normocephalic.  Right Ear: Tympanic membrane and external ear normal.  Left Ear: Tympanic membrane and external ear normal.  Eyes: Pupils are equal, round, and reactive to light. EOM and lids are normal.  Neck: Normal range of motion. Neck supple. Carotid bruit is not present.  Cardiovascular: Normal rate, regular rhythm, normal heart sounds, intact distal pulses and normal pulses.  Pulmonary/Chest: Breath sounds normal. No stridor. No respiratory distress. She has no wheezes.  Abdominal: Soft. Bowel sounds are normal. She exhibits no distension and no mass. There is tenderness. There is no guarding.  There is right flank pain to palpation.  There is some mild discomfort the right lower abdomen extending toward the suprapubic area.  No pulsating mass appreciated.  Musculoskeletal: Normal range of motion.  Radial pulses and dorsalis pedis pulses are symmetrical.  There are no temperature changes to the right versus left upper or lower extremities.  Pain to palpation of the right paraspinal area at the lower thoracic region in the lumbar region.  Lymphadenopathy:       Head (right side): No submandibular adenopathy present.       Head (left side): No submandibular adenopathy  present.    She has no cervical adenopathy.  Neurological: She is alert and oriented to person, place, and time. She has normal strength. No cranial nerve deficit or sensory deficit.  Skin: Skin is warm and dry. Pallor: .  Psychiatric: She has a normal mood and affect. Her speech is normal.  Nursing note and vitals reviewed.    ED Treatments / Results  Labs (all labs ordered are listed, but only abnormal results are displayed) Labs Reviewed  URINALYSIS, ROUTINE  W REFLEX MICROSCOPIC - Abnormal; Notable for the following components:      Result Value   APPearance HAZY (*)    Specific Gravity, Urine >1.030 (*)    Hgb urine dipstick MODERATE (*)    All other components within normal limits  CBC WITH DIFFERENTIAL/PLATELET - Abnormal; Notable for the following components:   WBC 15.3 (*)    Neutro Abs 12.7 (*)    All other components within normal limits  COMPREHENSIVE METABOLIC PANEL - Abnormal; Notable for the following components:   Glucose, Bld 199 (*)    All other components within normal limits  URINALYSIS, MICROSCOPIC (REFLEX) - Abnormal; Notable for the following components:   Bacteria, UA MANY (*)    All other components within normal limits  URINE CULTURE  PREGNANCY, URINE  LIPASE, BLOOD    EKG None  Radiology Ct Abdomen Pelvis W Contrast  Result Date: 10/21/2017 CLINICAL DATA:  Flank and back pain that started today EXAM: CT ABDOMEN AND PELVIS WITH CONTRAST TECHNIQUE: Multidetector CT imaging of the abdomen and pelvis was performed using the standard protocol following bolus administration of intravenous contrast. CONTRAST:  165mL ISOVUE-300 IOPAMIDOL (ISOVUE-300) INJECTION 61% COMPARISON:  08/03/2011 FINDINGS: Lower chest:  No contributory findings. Hepatobiliary: No focal liver abnormality.No evidence of biliary obstruction or stone. Pancreas: Unremarkable. Spleen: Unremarkable. Adrenals/Urinary Tract: Negative adrenals. Right hydroureteronephrosis secondary to a 3 mm  stone in the distal right ureter, within 1 cm of the UVJ. There is obstructive delayed right renal excretion. Mild right periureteric stranding. No additional stone is seen. Two left renal cystic densities measuring up to 3.3 cm at the upper pole Stomach/Bowel:  No obstruction. No appendicitis. Vascular/Lymphatic: No acute vascular abnormality. No mass or adenopathy. Reproductive:IUD in good position Other: No ascites or pneumoperitoneum. Musculoskeletal: No acute abnormalities. IMPRESSION: Obstructing 3 mm stone in the distal right ureter. Electronically Signed   By: Monte Fantasia M.D.   On: 10/21/2017 00:08    Procedures Procedures (including critical care time)  Medications Ordered in ED Medications  sodium chloride 0.9 % bolus 1,000 mL (0 mLs Intravenous Stopped 10/21/17 0101)  morphine 4 MG/ML injection 4 mg (4 mg Intravenous Given 10/20/17 2248)  ondansetron (ZOFRAN) injection 4 mg (4 mg Intravenous Given 10/20/17 2248)  iopamidol (ISOVUE-300) 61 % injection 100 mL (100 mLs Intravenous Contrast Given 10/20/17 2341)  prochlorperazine (COMPAZINE) injection 5 mg (5 mg Intravenous Given 10/20/17 2334)  HYDROmorphone (DILAUDID) injection 0.5 mg (0.5 mg Intravenous Given 10/20/17 2333)  HYDROmorphone (DILAUDID) injection 0.5 mg (0.5 mg Intravenous Given 10/21/17 0035)  ketorolac (TORADOL) 30 MG/ML injection 30 mg (30 mg Intravenous Given 10/21/17 0035)  prochlorperazine (COMPAZINE) injection 5 mg (5 mg Intravenous Given 10/21/17 0035)     Initial Impression / Assessment and Plan / ED Course  I have reviewed the triage vital signs and the nursing notes.  Pertinent labs & imaging results that were available during my care of the patient were reviewed by me and considered in my medical decision making (see chart for details).       Final Clinical Impressions(s) / ED Diagnoses MDm Vital signs reviewed.  Pulse oximetry is 100% on room air.  Within normal limits by my interpretation. The complete blood  count shows the white blood cells to be elevated at 15.3, there is no shift to the left.  Comprehensive metabolic panel shows the glucose to be elevated at 199 the remainder of the panel is normal. Lipase is normal.  Urine analysis shows a specific  gravity be elevated at 1.030 there is moderate blood on the dipstick.  There are 11-20 white blood cells per high-powered field, 6-10 white blood cells per high-powered field, and many bacteria.  A culture the urine has been sent to the lab.   CT scan of the abdomen and pelvis shows an obstructing 3 mm stoneIn the distal right ureter.  There is some right hydroureteronephrosis present.  The stone appears to be 1 cm from the UVJ.  I have advised the patient to strain all urine.  A prescription for meloxicam and Percocet given to the patient.  The patient will see the physicians at [facility] u[facility] concerning her kidney stone.  She will return to the emergency department if any intractable pain or other problems arise.  Prescription for Keflex also given to the patient to use until cultures have been completed.  Patient is in agreement with this plan.   Final diagnoses:  Ureteral colic  Kidney stone    ED Discharge Orders        Ordered    cephALEXin (KEFLEX) 500 MG capsule  4 times daily     10/21/17 0117    oxyCODONE-acetaminophen (PERCOCET/ROXICET) 5-325 MG tablet  Every 6 hours PRN     10/21/17 0117    meloxicam (MOBIC) 15 MG tablet  Daily     10/21/17 0117       Lily Kocher, PA-C 10/21/17 0150    Rolland Porter, MD 10/21/17 [phone]

## 2017-10-21 NOTE — Discharge Instructions (Signed)
You have a 44mm kidney stone on the right side. Please strain all urine. Use Mobic daily with food. Use percocet for pain if needed. Please add lemonade to your diet. See MD at [facility] as soon as possible concerning your kidney stones. Please return to the Emergency Dept if any changes in your condition or if pain becomes worse.

## 2017-10-22 LAB — URINE CULTURE

## 2017-10-23 DIAGNOSIS — N201 Calculus of ureter: Secondary | ICD-10-CM | POA: Diagnosis not present

## 2017-10-23 MED FILL — TAMSULOSIN HCL 0.4 MG CAP: 0.4 | 30 days supply | Qty: 30 | Fill #0

## 2017-10-31 DIAGNOSIS — N201 Calculus of ureter: Secondary | ICD-10-CM | POA: Diagnosis not present

## 2017-11-17 MED FILL — PANTOPRAZOLE SOD DR 40 MG T: 40 | 90 days supply | Qty: 90 | Fill #1

## 2017-11-17 MED FILL — metFORMIN HCL 500 MG TABS: 500 | 90 days supply | Qty: 180 | Fill #1

## 2018-01-27 MED FILL — buPROPion HCL ER (XL) 150 M: 150 | 90 days supply | Qty: 90 | Fill #0

## 2018-03-16 MED FILL — PANTOPRAZOLE SOD DR 40 MG T: 40 | 90 days supply | Qty: 90 | Fill #0

## 2018-03-23 DIAGNOSIS — T8332XA Displacement of intrauterine contraceptive device, initial encounter: Secondary | ICD-10-CM | POA: Diagnosis not present

## 2018-04-03 MED FILL — OSELTAMIVIR PHOSPHATE 75 MG: 75 | 10 days supply | Qty: 10 | Fill #0

## 2018-04-09 ENCOUNTER — Ambulatory Visit (INDEPENDENT_AMBULATORY_CARE_PROVIDER_SITE_OTHER): Payer: Self-pay | Admitting: Family Medicine

## 2018-04-09 VITALS — BP 125/80 | HR 93 | Temp 98.3°F | Resp 20 | Wt 333.6 lb

## 2018-04-09 DIAGNOSIS — J019 Acute sinusitis, unspecified: Secondary | ICD-10-CM

## 2018-04-09 DIAGNOSIS — R059 Cough, unspecified: Secondary | ICD-10-CM

## 2018-04-09 DIAGNOSIS — R0981 Nasal congestion: Secondary | ICD-10-CM

## 2018-04-09 DIAGNOSIS — R05 Cough: Secondary | ICD-10-CM

## 2018-04-09 MED ORDER — AZELASTINE HCL 0.1 % NA SOLN: 1.0000 | Freq: Two times a day (BID) | NASAL | 0 refills | Status: AC

## 2018-04-09 MED ORDER — PSEUDOEPH-BROMPHEN-DM 30-2-10 MG/5ML PO SYRP: 10.0000 mL | ORAL_SOLUTION | Freq: Three times a day (TID) | ORAL | 0 refills | Status: DC | PRN

## 2018-04-09 MED ORDER — BENZONATATE 100 MG PO CAPS: 100.0000 mg | ORAL_CAPSULE | Freq: Three times a day (TID) | ORAL | 0 refills | Status: DC | PRN

## 2018-04-09 MED FILL — AZELASTINE HCL 137 MCG SPRY: 0.1 | 50 days supply | Qty: 30 | Fill #0

## 2018-04-09 MED FILL — BENZONATATE 100 MG CAPS: 100 | 5 days supply | Qty: 30 | Fill #0

## 2018-04-09 MED FILL — BROMPHENIR-PSEUDOEPHED-DM S: 30-2-10 | 4 days supply | Qty: 120 | Fill #0

## 2018-04-09 NOTE — Patient Instructions (Addendum)
Sinusitis, Adult  Supportive Symptomatic care- will consider abx if symptoms unimproved in 48-72 hours or fever develops and symptoms worsen significantly.   Sinusitis is inflammation of your sinuses. Sinuses are hollow spaces in the bones around your face. Your sinuses are located:  Around your eyes.  In the middle of your forehead.  Behind your nose.  In your cheekbones. Mucus normally drains out of your sinuses. When your nasal tissues become inflamed or swollen, mucus can become trapped or blocked. This allows bacteria, viruses, and fungi to grow, which leads to infection. Most infections of the sinuses are caused by a virus. Sinusitis can develop quickly. It can last for up to 4 weeks (acute) or for more than 12 weeks (chronic). Sinusitis often develops after a cold. What are the causes? This condition is caused by anything that creates swelling in the sinuses or stops mucus from draining. This includes:  Allergies.  Asthma.  Infection from bacteria or viruses.  Deformities or blockages in your nose or sinuses.  Abnormal growths in the nose (nasal polyps).  Pollutants, such as chemicals or irritants in the air.  Infection from fungi (rare). What increases the risk? You are more likely to develop this condition if you:  Have a weak body defense system (immune system).  Do a lot of swimming or diving.  Overuse nasal sprays.  Smoke. What are the signs or symptoms? The main symptoms of this condition are pain and a feeling of pressure around the affected sinuses. Other symptoms include:  Stuffy nose or congestion.  Thick drainage from your nose.  Swelling and warmth over the affected sinuses.  Headache.  Upper toothache.  A cough that may get worse at night.  Extra mucus that collects in the throat or the back of the nose (postnasal drip).  Decreased sense of smell and taste.  Fatigue.  A fever.  Sore throat.  Bad breath. How is this  diagnosed? This condition is diagnosed based on:  Your symptoms.  Your medical history.  A physical exam.  Tests to find out if your condition is acute or chronic. This may include: ? Checking your nose for nasal polyps. ? Viewing your sinuses using a device that has a light (endoscope). ? Testing for allergies or bacteria. ? Imaging tests, such as an MRI or CT scan. In rare cases, a bone biopsy may be done to rule out more serious types of fungal sinus disease. How is this treated? Treatment for sinusitis depends on the cause and whether your condition is chronic or acute.  If caused by a virus, your symptoms should go away on their own within 10 days. You may be given medicines to relieve symptoms. They include: ? Medicines that shrink swollen nasal passages (topical intranasal decongestants). ? Medicines that treat allergies (antihistamines). ? A spray that eases inflammation of the nostrils (topical intranasal corticosteroids). ? Rinses that help get rid of thick mucus in your nose (nasal saline washes).  If caused by bacteria, your health care provider may recommend waiting to see if your symptoms improve. Most bacterial infections will get better without antibiotic medicine. You may be given antibiotics if you have: ? A severe infection. ? A weak immune system.  If caused by narrow nasal passages or nasal polyps, you may need to have surgery. Follow these instructions at home: Medicines  Take, use, or apply over-the-counter and prescription medicines only as told by your health care provider. These may include nasal sprays.  If you were prescribed  an antibiotic medicine, take it as told by your health care provider. Do not stop taking the antibiotic even if you start to feel better. Hydrate and humidify   Drink enough fluid to keep your urine pale yellow. Staying hydrated will help to thin your mucus.  Use a cool mist humidifier to keep the humidity level in your home  above 50%.  Inhale steam for 10-15 minutes, 3-4 times a day, or as told by your health care provider. You can do this in the bathroom while a hot shower is running.  Limit your exposure to cool or dry air. Rest  Rest as much as possible.  Sleep with your head raised (elevated).  Make sure you get enough sleep each night. General instructions   Apply a warm, moist washcloth to your face 3-4 times a day or as told by your health care provider. This will help with discomfort.  Wash your hands often with soap and water to reduce your exposure to germs. If soap and water are not available, use hand sanitizer.  Do not smoke. Avoid being around people who are smoking (secondhand smoke).  Keep all follow-up visits as told by your health care provider. This is important. Contact a health care provider if:  You have a fever.  Your symptoms get worse.  Your symptoms do not improve within 10 days. Get help right away if:  You have a severe headache.  You have persistent vomiting.  You have severe pain or swelling around your face or eyes.  You have vision problems.  You develop confusion.  Your neck is stiff.  You have trouble breathing. Summary  Sinusitis is soreness and inflammation of your sinuses. Sinuses are hollow spaces in the bones around your face.  This condition is caused by nasal tissues that become inflamed or swollen. The swelling traps or blocks the flow of mucus. This allows bacteria, viruses, and fungi to grow, which leads to infection.  If you were prescribed an antibiotic medicine, take it as told by your health care provider. Do not stop taking the antibiotic even if you start to feel better.  Keep all follow-up visits as told by your health care provider. This is important. This information is not intended to replace advice given to you by your health care provider. Make sure you discuss any questions you have with your health care provider. Document  Released: 04/01/2005 Document Revised: 09/01/2017 Document Reviewed: 09/01/2017 Elsevier Interactive Patient Education  2019 [employer].

## 2018-04-09 NOTE — Progress Notes (Signed)
[name] is a 42 y.o. female who presents today with concerns of sinus congestion that wax and wanes over he last 7 days. She reports that historically she gets symptoms that turn into infections and requires antibiotics. She denies fever up to this point. She reports a cough that responds well to over the counter treatment options. She reports known sick contacts of a child in her home.  Review of Systems  Constitutional: Negative for chills, fever and malaise/fatigue.  HENT: Positive for congestion and sinus pain. Negative for ear discharge, ear pain and sore throat.   Eyes: Negative.   Respiratory: Positive for cough. Negative for sputum production and shortness of breath.   Cardiovascular: Negative.  Negative for chest pain.  Gastrointestinal: Negative for abdominal pain, diarrhea, nausea and vomiting.  Genitourinary: Negative for dysuria, frequency, hematuria and urgency.  Musculoskeletal: Negative for myalgias.  Skin: Negative.   Neurological: Negative for headaches.  Endo/Heme/Allergies: Negative.   Psychiatric/Behavioral: Negative.     O: Vitals:   04/09/18 1223  BP: 125/80  Pulse: 93  Resp: 20  Temp: 98.3 F (36.8 C)  SpO2: 98%     Physical Exam Vitals signs reviewed.  Constitutional:      Appearance: Normal appearance. She is well-developed. She is not toxic-appearing.  HENT:     Head: Normocephalic.     Right Ear: Hearing, ear canal and external ear normal. A middle ear effusion is present.     Left Ear: Hearing, tympanic membrane, ear canal and external ear normal.     Nose: Congestion and rhinorrhea present. No mucosal edema.     Right Sinus: Maxillary sinus tenderness and frontal sinus tenderness present.     Left Sinus: Maxillary sinus tenderness and frontal sinus tenderness present.     Mouth/Throat:     Lips: Pink.     Mouth: Mucous membranes are moist.     Pharynx: Uvula midline.     Tonsils: No tonsillar exudate or tonsillar abscesses. Swelling: 1+  on the right. 1+ on the left.  Eyes:     Conjunctiva/sclera: Conjunctivae normal.  Neck:     Musculoskeletal: Normal range of motion and neck supple.  Cardiovascular:     Rate and Rhythm: Normal rate and regular rhythm.     Pulses: Normal pulses.     Heart sounds: Normal heart sounds.  Pulmonary:     Effort: Pulmonary effort is normal.     Breath sounds: Normal breath sounds. No decreased breath sounds, wheezing, rhonchi or rales.  Abdominal:     General: Bowel sounds are normal.     Palpations: Abdomen is soft.  Musculoskeletal: Normal range of motion.  Lymphadenopathy:     Head:     Right side of head: No submental, submandibular or tonsillar adenopathy.     Left side of head: No submental, submandibular or tonsillar adenopathy.     Cervical: No cervical adenopathy.  Skin:    General: Skin is warm.  Neurological:     Mental Status: She is alert and oriented to person, place, and time.  Psychiatric:        Mood and Affect: Mood normal.        Behavior: Behavior is cooperative.    A: 1. Acute non-recurrent sinusitis, unspecified location   2. Cough   3. Nasal congestion    P: Discussed exam findings (exam overall unremarkable), diagnosis etiology and medication use and indications reviewed with patient. Follow- Up and discharge instructions provided. No emergent/urgent issues found  on exam.  Patient verbalized understanding of information provided and agrees with plan of care (POC), all questions answered. Patient declined work note at time of visit.  Supportive Symptomatic care with prescription options to decrease congestion- will consider abx if symptoms unimproved in 48-72 hours or fever develops and symptoms worsen significantly. Will consider Augmentin 875 mg x 7 days if needed.  1. Acute non-recurrent sinusitis, unspecified location - brompheniramine-pseudoephedrine-DM 30-2-10 MG/5ML syrup; Take 10 mLs by mouth 3 (three) times daily as needed. - azelastine  (ASTELIN) 0.1 % nasal spray; Place 1 spray into both nostrils 2 (two) times daily. Use in each nostril as directed  2. Cough - benzonatate (TESSALON) 100 MG capsule; Take 1-2 capsules (100-200 mg total) by mouth 3 (three) times daily as needed for cough (with full glass of water).  3. Nasal congestion - azelastine (ASTELIN) 0.1 % nasal spray; Place 1 spray into both nostrils 2 (two) times daily. Use in each nostril as directed

## 2018-04-12 ENCOUNTER — Telehealth: Payer: 59 | Admitting: Family

## 2018-04-12 ENCOUNTER — Telehealth: Payer: Self-pay | Admitting: Family Medicine

## 2018-04-12 DIAGNOSIS — B9689 Other specified bacterial agents as the cause of diseases classified elsewhere: Secondary | ICD-10-CM | POA: Diagnosis not present

## 2018-04-12 DIAGNOSIS — J019 Acute sinusitis, unspecified: Secondary | ICD-10-CM

## 2018-04-12 MED ORDER — PREDNISONE 10 MG (21) PO TBPK: ORAL_TABLET | ORAL | 0 refills | Status: DC

## 2018-04-12 MED ORDER — AMOXICILLIN-POT CLAVULANATE 875-125 MG PO TABS: 1.0000 | ORAL_TABLET | Freq: Two times a day (BID) | ORAL | 0 refills | Status: AC

## 2018-04-12 NOTE — Progress Notes (Signed)
We are sorry that you are not feeling well.  Here is how we plan to help!  Based on what you have shared with me it looks like you have sinusitis.  Sinusitis is inflammation and infection in the sinus cavities of the head.  Based on your presentation I believe you most likely have Acute Sinusitis.This is an infection most likely caused by an infection. There is not specific treatment for viral sinusitis other than to help you with the symptoms until the infection runs its course.  You may use an oral decongestant such as Mucinex D or if you have glaucoma or high blood pressure use plain Mucinex. Saline nasal spray help and can safely be used as often as needed for congestion, I have prescribed: Prednisone taper. Some authorities believe that zinc sprays or the use of Echinacea may shorten the course of your symptoms.  Sinus infections are not as easily transmitted as other respiratory infection, however we still recommend that you avoid close contact with loved ones, especially the very young and elderly.  Remember to wash your hands thoroughly throughout the day as this is the number one way to prevent the spread of infection!  Home Care:  Only take medications as instructed by your medical team.  Do not take these medications with alcohol.  A steam or ultrasonic humidifier can help congestion.  You can place a towel over your head and breathe in the steam from hot water coming from a faucet.  Avoid close contacts especially the very young and the elderly.  Cover your mouth when you cough or sneeze.  Always remember to wash your hands.  Get Help Right Away If:  You develop worsening fever or sinus pain.  You develop a severe head ache or visual changes.  Your symptoms persist after you have completed your treatment plan.  Make sure you  Understand these instructions.  Will watch your condition.  Will get help right away if you are not doing well or get worse.  Your e-visit  answers were reviewed by a board certified advanced clinical practitioner to complete your personal care plan.  Depending on the condition, your plan could have included both over the counter or prescription medications.  If there is a problem please reply  once you have received a response from your provider.  Your safety is important to [place].  If you have drug allergies check your prescription carefully.    You can use MyChart to ask questions about today's visit, request a non-urgent call back, or ask for a work or school excuse for 24 hours related to this e-Visit. If it has been greater than 24 hours you will need to follow up with your provider, or enter a new e-Visit to address those concerns.  You will get an e-mail in the next two days asking about your experience.  I hope that your e-visit has been valuable and will speed your recovery. Thank you for using e-visits.

## 2018-04-12 NOTE — Telephone Encounter (Signed)
Patient symptoms not improved will send in abx per providers note of encounter 04/09/18

## 2018-04-16 DIAGNOSIS — J069 Acute upper respiratory infection, unspecified: Secondary | ICD-10-CM | POA: Diagnosis not present

## 2018-04-16 DIAGNOSIS — Z6841 Body Mass Index (BMI) 40.0 and over, adult: Secondary | ICD-10-CM | POA: Diagnosis not present

## 2018-04-16 DIAGNOSIS — Z1389 Encounter for screening for other disorder: Secondary | ICD-10-CM | POA: Diagnosis not present

## 2018-05-28 MED FILL — PANTOPRAZOLE SOD DR 40 MG T: 40 | 90 days supply | Qty: 90 | Fill #1 | Status: TO

## 2018-06-02 DIAGNOSIS — Z1389 Encounter for screening for other disorder: Secondary | ICD-10-CM | POA: Diagnosis not present

## 2018-06-02 DIAGNOSIS — Z6841 Body Mass Index (BMI) 40.0 and over, adult: Secondary | ICD-10-CM | POA: Diagnosis not present

## 2018-06-02 DIAGNOSIS — K529 Noninfective gastroenteritis and colitis, unspecified: Secondary | ICD-10-CM | POA: Diagnosis not present

## 2018-06-22 MED FILL — OSELTAMIVIR PHOSPHATE 75 MG: 75 | 7 days supply | Qty: 7 | Fill #0

## 2018-07-16 MED FILL — metFORMIN HCL 500 MG TABS: 500 | 90 days supply | Qty: 180 | Fill #0

## 2018-08-26 DIAGNOSIS — Z1231 Encounter for screening mammogram for malignant neoplasm of breast: Secondary | ICD-10-CM | POA: Diagnosis not present

## 2018-08-26 DIAGNOSIS — R61 Generalized hyperhidrosis: Secondary | ICD-10-CM | POA: Diagnosis not present

## 2018-08-26 DIAGNOSIS — Z0000 Encounter for general adult medical examination without abnormal findings: Secondary | ICD-10-CM | POA: Diagnosis not present

## 2018-08-26 DIAGNOSIS — T8339XD Other mechanical complication of intrauterine contraceptive device, subsequent encounter: Secondary | ICD-10-CM | POA: Diagnosis not present

## 2018-08-26 DIAGNOSIS — Z01419 Encounter for gynecological examination (general) (routine) without abnormal findings: Secondary | ICD-10-CM | POA: Diagnosis not present

## 2018-08-26 DIAGNOSIS — Z124 Encounter for screening for malignant neoplasm of cervix: Secondary | ICD-10-CM | POA: Diagnosis not present

## 2018-08-26 DIAGNOSIS — Z130 Encounter for screening for diseases of the blood and blood-forming organs and certain disorders involving the immune mechanism: Secondary | ICD-10-CM | POA: Diagnosis not present

## 2018-08-26 DIAGNOSIS — Z6841 Body Mass Index (BMI) 40.0 and over, adult: Secondary | ICD-10-CM | POA: Diagnosis not present

## 2018-08-26 DIAGNOSIS — Z30433 Encounter for removal and reinsertion of intrauterine contraceptive device: Secondary | ICD-10-CM | POA: Diagnosis not present

## 2018-08-26 DIAGNOSIS — Z1389 Encounter for screening for other disorder: Secondary | ICD-10-CM | POA: Diagnosis not present

## 2018-08-27 MED FILL — metFORMIN HCL 1000 MG TABS: 1000 | 30 days supply | Qty: 60 | Fill #0

## 2018-10-02 MED FILL — PANTOPRAZOLE SOD DR 40 MG T: 40 | 90 days supply | Qty: 90 | Fill #0

## 2018-10-02 MED FILL — metFORMIN HCL 1000 MG TABS: 1000 | 90 days supply | Qty: 180 | Fill #0

## 2018-10-14 ENCOUNTER — Encounter: Payer: Self-pay | Admitting: Emergency Medicine

## 2018-10-14 ENCOUNTER — Other Ambulatory Visit: Payer: Self-pay

## 2018-10-14 ENCOUNTER — Ambulatory Visit
Admission: EM | Admit: 2018-10-14 | Discharge: 2018-10-14 | Disposition: A | Discharge status: Home / Self Care | Payer: 59

## 2018-10-14 DIAGNOSIS — Z23 Encounter for immunization: Secondary | ICD-10-CM

## 2018-10-14 DIAGNOSIS — S91312A Laceration without foreign body, left foot, initial encounter: Secondary | ICD-10-CM

## 2018-10-14 MED ORDER — TETANUS-DIPHTH-ACELL PERTUSSIS 5-2.5-18.5 LF-MCG/0.5 IM SUSP
0.5000 mL | Freq: Once | INTRAMUSCULAR | Status: AC
  Administered 2018-10-14: 16:00:00 0.5 mL via INTRAMUSCULAR

## 2018-10-14 NOTE — ED Notes (Addendum)
Non adherent dressing applied. Wrapped with ace wrap

## 2018-10-14 NOTE — ED Provider Notes (Addendum)
[facility]   [id number] 10/14/18 Arrival Time: 6  CC: LACERATION  SUBJECTIVE:  [name] is a 43 y.o. female who presents with a laceration that occurred last night.  Symptoms began after screen door scraped the back of her left heel.  Bleeding controlled.  Currently not on blood thinners.  Denies similar symptoms in the past.  Denies fever, chills, nausea, vomiting, redness, swelling, purulent drainage, decrease strength or sensation.   Td UTD: Unknown.  ROS: As per HPI.  Past Medical History:  Diagnosis Date  . Back pain   . GERD (gastroesophageal reflux disease)   . Palpitations   . PONV (postoperative nausea and vomiting)   . Spinal headache    Past Surgical History:  Procedure Laterality Date  . CESAREAN SECTION    . Left knee arthroscopic surgery    . WISDOM TOOTH EXTRACTION     Allergies  Allergen Reactions  . Erythromycin Base    No current facility-administered medications on file prior to encounter.    Current Outpatient Medications on File Prior to Encounter  Medication Sig Dispense Refill  . calcium-vitamin D (OSCAL WITH D) 500-200 MG-UNIT tablet Take 1 tablet by mouth.    . cholecalciferol (VITAMIN D3) 25 MCG (1000 UT) tablet Take 5,000 Units by mouth daily.    Marland Kitchen azelastine (ASTELIN) 0.1 % nasal spray Place 1 spray into both nostrils 2 (two) times daily. Use in each nostril as directed 30 mL 0  . benzonatate (TESSALON) 100 MG capsule Take 1-2 capsules (100-200 mg total) by mouth 3 (three) times daily as needed for cough (with full glass of water). 30 capsule 0  . brompheniramine-pseudoephedrine-DM 30-2-10 MG/5ML syrup Take 10 mLs by mouth 3 (three) times daily as needed. 120 mL 0  . cephALEXin (KEFLEX) 500 MG capsule Take 1 capsule (500 mg total) by mouth 4 (four) times daily. (Patient not taking: Reported on 04/09/2018) 20 capsule 0  . fluticasone (FLONASE) 50 MCG/ACT nasal spray Place 2 sprays into both nostrils daily for 10 days. 16 g 0  .  guaiFENesin (MUCINEX) 600 MG 12 hr tablet Take by mouth 2 (two) times daily.    Marland Kitchen levonorgestrel (MIRENA) 20 MCG/24HR IUD 1 each by Intrauterine route once.    . meloxicam (MOBIC) 15 MG tablet Take 1 tablet (15 mg total) by mouth daily. (Patient not taking: Reported on 04/09/2018) 7 tablet 0  . metFORMIN (GLUCOPHAGE) 500 MG tablet 1,000 mg 2 (two) times daily with a meal.     . oxyCODONE-acetaminophen (PERCOCET/ROXICET) 5-325 MG tablet Take 1 tablet by mouth every 6 (six) hours as needed. (Patient not taking: Reported on 04/09/2018) 15 tablet 0  . pantoprazole (PROTONIX) 40 MG tablet pantoprazole 40 mg tablet,delayed release    . predniSONE (STERAPRED UNI-PAK 21 TAB) 10 MG (21) TBPK tablet As directed 21 tablet 0  . pseudoephedrine (SUDAFED) 60 MG tablet Take 60 mg by mouth every 4 (four) hours as needed for congestion.    . [DISCONTINUED] esomeprazole (NEXIUM) 40 MG capsule Take 40 mg by mouth daily before breakfast.    . [DISCONTINUED] metoprolol succinate (TOPROL-XL) 50 MG 24 hr tablet Take 25 mg by mouth daily. 1/2 tablet daily     Social History   Socioeconomic History  . Marital status: Divorced    Spouse name: Not on file  . Number of children: 2  . Years of education: Not on file  . Highest education level: Not on file  Occupational History    Employer:  [employer]  . Financial resource strain: Not on file  . Food insecurity    Worry: Not on file    Inability: Not on file  . Transportation needs    Medical: Not on file    Non-medical: Not on file  Tobacco Use  . Smoking status: Former Smoker    Quit date: 08/03/2002    Years since quitting: 16.2  . Smokeless tobacco: Never Used  Substance and Sexual Activity  . Alcohol use: No  . Drug use: No  . Sexual activity: Not on file  Lifestyle  . Physical activity    Days per week: Not on file    Minutes per session: Not on file  . Stress: Not on file  Relationships  . Social Herbalist on phone:  Not on file    Gets together: Not on file    Attends religious service: Not on file    Active member of club or organization: Not on file    Attends meetings of clubs or organizations: Not on file    Relationship status: Not on file  . Intimate partner violence    Fear of current or ex partner: Not on file    Emotionally abused: Not on file    Physically abused: Not on file    Forced sexual activity: Not on file  Other Topics Concern  . Not on file  Social History Narrative  . Not on file   Family History  Problem Relation Age of Onset  . Hypertension Mother   . Stroke Mother   . Anesthesia problems Mother   . Anesthesia problems Sister   . Hypertension Father      OBJECTIVE:  Vitals:   10/14/18 1344  BP: 133/87  Pulse: 98  Resp: 20  Temp: 98.9 F (37.2 C)  SpO2: 95%     General appearance: alert; no distress Skin: laceration of LT heel; size: approx 1.5 cm; ankle and foot strength intact; dorsalis pedis pulse 2+ and intact (see pictures below) Psychological: alert and cooperative; normal mood and affect       Procedure: Verbal consent obtained. Patient provided with risks and alternatives to the procedure. Wound copiously irrigated with NS then cleansed with betadine. Anesthetized with 3 mL of lidocaine with epinephrine. Wound carefully explored. No foreign body, tendon injury, or nonviable tissue were noted. Using sterile technique 2 interrupted and 1 horizontal 4-0 Prolene sutures were placed to reapproximate the wound. Patient tolerated procedure well. No complications. Minimal bleeding. Patient advised to look for and return for any signs of infection such as redness, swelling, discharge, or worsening pain. Return for suture removal in 10 days.  ASSESSMENT & PLAN:  1. Laceration of left heel, initial encounter     Meds ordered this encounter  Medications  . Tdap (BOOSTRIX) injection 0.5 mL   3 sutures applied Tetanus updated Bandage applied Keep  covered for next and dry for next 24-48 hours.  After then you may gently clean with warm water and mild soap.  Avoid submerging wound in water. Change dressing daily and apply a thin layer of neosporin.  Return in 10 days to have sutures removed.   Take OTC ibuprofen or tylenol as needed for pain relief Return sooner or go to the ED if you have any new or worsening symptoms such as increased pain, redness, swelling, drainage, discharge, decreased range of motion of extremity, etc..     Reviewed expectations re: course of current  medical issues. Questions answered. Outlined signs and symptoms indicating need for more acute intervention. Patient verbalized understanding. After Visit Summary given.      Lestine Box, PA-C 10/14/18 1607

## 2018-10-14 NOTE — Discharge Instructions (Signed)
3 sutures applied Tetanus updated Bandage applied Keep covered for next and dry for next 24-48 hours.  After then you may gently clean with warm water and mild soap.  Avoid submerging wound in water. Change dressing daily and apply a thin layer of neosporin.  Return in 10 days to have sutures removed.   Take OTC ibuprofen or tylenol as needed for pain relief Return sooner or go to the ED if you have any new or worsening symptoms such as increased pain, redness, swelling, drainage, discharge, decreased range of motion of extremity, etc..

## 2018-10-14 NOTE — ED Triage Notes (Signed)
Pt had right heel injured by screen door, yesterday

## 2018-10-14 NOTE — ED Triage Notes (Signed)
Correction, laceration is to left ankle

## 2018-10-15 DIAGNOSIS — Z6841 Body Mass Index (BMI) 40.0 and over, adult: Secondary | ICD-10-CM | POA: Diagnosis not present

## 2018-10-15 DIAGNOSIS — S91329A Laceration with foreign body, unspecified foot, initial encounter: Secondary | ICD-10-CM | POA: Diagnosis not present

## 2018-10-15 DIAGNOSIS — Z1389 Encounter for screening for other disorder: Secondary | ICD-10-CM | POA: Diagnosis not present

## 2018-10-29 DIAGNOSIS — S91302D Unspecified open wound, left foot, subsequent encounter: Secondary | ICD-10-CM | POA: Diagnosis not present

## 2018-10-29 DIAGNOSIS — Z6841 Body Mass Index (BMI) 40.0 and over, adult: Secondary | ICD-10-CM | POA: Diagnosis not present

## 2019-02-04 DIAGNOSIS — Z23 Encounter for immunization: Secondary | ICD-10-CM | POA: Diagnosis not present

## 2019-02-04 MED FILL — metFORMIN HCL 1000 MG TABS: 1000 | 30 days supply | Qty: 60 | Fill #1

## 2019-02-04 MED FILL — PANTOPRAZOLE SOD DR 40 MG T: 40 | 90 days supply | Qty: 90 | Fill #1

## 2019-02-24 DIAGNOSIS — E119 Type 2 diabetes mellitus without complications: Secondary | ICD-10-CM | POA: Diagnosis not present

## 2019-04-01 MED FILL — metFORMIN HCL 1000 MG TABS: 1000 | 90 days supply | Qty: 180 | Fill #0

## 2019-04-13 MED FILL — metFORMIN HCL 1000 MG TABS: 1000 | 90 days supply | Qty: 180 | Fill #0

## 2019-05-17 MED FILL — PANTOPRAZOLE SOD DR 40 MG T: 40 | 90 days supply | Qty: 90 | Fill #0

## 2019-05-19 DIAGNOSIS — H5203 Hypermetropia, bilateral: Secondary | ICD-10-CM | POA: Diagnosis not present

## 2019-05-27 MED FILL — CYCLOBENZAPRINE HCL 10 MG T: 10 | 30 days supply | Qty: 30 | Fill #0

## 2019-06-08 ENCOUNTER — Ambulatory Visit (HOSPITAL_COMMUNITY)
Admission: RE | Admit: 2019-06-08 | Discharge: 2019-06-08 | Disposition: A | Discharge status: Home / Self Care | Payer: PRIVATE HEALTH INSURANCE | Source: Ambulatory Visit | Attending: Family Medicine | Admitting: Family Medicine

## 2019-06-08 ENCOUNTER — Other Ambulatory Visit (HOSPITAL_COMMUNITY): Payer: Self-pay | Admitting: Family Medicine

## 2019-06-08 ENCOUNTER — Other Ambulatory Visit: Payer: Self-pay

## 2019-06-08 DIAGNOSIS — S4991XA Unspecified injury of right shoulder and upper arm, initial encounter: Secondary | ICD-10-CM | POA: Diagnosis not present

## 2019-06-08 DIAGNOSIS — M25511 Pain in right shoulder: Secondary | ICD-10-CM | POA: Diagnosis not present

## 2019-06-08 DIAGNOSIS — M25512 Pain in left shoulder: Secondary | ICD-10-CM | POA: Insufficient documentation

## 2019-06-08 DIAGNOSIS — W19XXXA Unspecified fall, initial encounter: Secondary | ICD-10-CM

## 2019-06-08 DIAGNOSIS — S4992XA Unspecified injury of left shoulder and upper arm, initial encounter: Secondary | ICD-10-CM | POA: Diagnosis not present

## 2019-06-21 ENCOUNTER — Other Ambulatory Visit: Payer: Self-pay | Admitting: Physician Assistant

## 2019-06-21 ENCOUNTER — Other Ambulatory Visit (HOSPITAL_COMMUNITY): Payer: Self-pay | Admitting: Physician Assistant

## 2019-06-21 DIAGNOSIS — M25511 Pain in right shoulder: Secondary | ICD-10-CM

## 2019-06-21 DIAGNOSIS — M25512 Pain in left shoulder: Secondary | ICD-10-CM

## 2019-07-06 ENCOUNTER — Ambulatory Visit (HOSPITAL_COMMUNITY)
Admission: RE | Admit: 2019-07-06 | Discharge: 2019-07-06 | Disposition: A | Discharge status: Home / Self Care | Payer: PRIVATE HEALTH INSURANCE | Source: Ambulatory Visit | Attending: Physician Assistant | Admitting: Physician Assistant

## 2019-07-06 ENCOUNTER — Other Ambulatory Visit: Payer: Self-pay

## 2019-07-06 DIAGNOSIS — M25512 Pain in left shoulder: Secondary | ICD-10-CM

## 2019-07-06 DIAGNOSIS — M25511 Pain in right shoulder: Secondary | ICD-10-CM | POA: Diagnosis not present

## 2019-07-13 ENCOUNTER — Other Ambulatory Visit: Payer: Self-pay

## 2019-07-13 ENCOUNTER — Ambulatory Visit (HOSPITAL_COMMUNITY): Payer: PRIVATE HEALTH INSURANCE | Attending: Specialist | Admitting: Occupational Therapy

## 2019-07-13 ENCOUNTER — Encounter (HOSPITAL_COMMUNITY): Payer: Self-pay | Admitting: Occupational Therapy

## 2019-07-13 DIAGNOSIS — M25512 Pain in left shoulder: Secondary | ICD-10-CM | POA: Insufficient documentation

## 2019-07-13 DIAGNOSIS — M25511 Pain in right shoulder: Secondary | ICD-10-CM | POA: Insufficient documentation

## 2019-07-13 DIAGNOSIS — R29898 Other symptoms and signs involving the musculoskeletal system: Secondary | ICD-10-CM | POA: Diagnosis present

## 2019-07-13 NOTE — Therapy (Signed)
[place], [place], [address] Phone: [phone]   Fax:  [phone]  Occupational Therapy Evaluation  Patient Details  Name: [name] MRN: [id number] Date of Birth: March 14, 1976 Referring Provider (OT): Dr. Sydnee Cabal   Encounter Date: 07/13/2019  OT End of Session - 07/13/19 1425    Visit Number  1    Number of Visits  8    Date for OT Re-Evaluation  08/12/19    Authorization Type  [facility]-Worker's comp    Authorization Time Period  1 eval and 12 visits approved    Authorization - Visit Number  1    Authorization - Number of Visits  13    Progress Note Due on Visit  10    OT Start Time  [id number]    OT Stop Time  1418    OT Time Calculation (min)  33 min    Activity Tolerance  Patient tolerated treatment well    Behavior During Therapy  [facility] for tasks assessed/performed       Past Medical History:  Diagnosis Date  . Back pain   . GERD (gastroesophageal reflux disease)   . Palpitations   . PONV (postoperative nausea and vomiting)   . Spinal headache     Past Surgical History:  Procedure Laterality Date  . CESAREAN SECTION    . Left knee arthroscopic surgery    . WISDOM TOOTH EXTRACTION      There were no vitals filed for this visit.  Subjective Assessment - 07/13/19 1417    Subjective   S: I fell at work and landed with my arms straight out, and my head bounced off the ground.    Pertinent History  Pt is a 44 y/o female presenting with bilateral shoulder pain, R>L, since 05/23/19. Pt has had MRI, left shoulder dx with tendinopathy and right shoulder dx with partial tear of right bursal surface. Pt also has an old right RC tear which was treated at this clinic in 2017.    Special Tests  FOTO: complete next session    Patient Stated Goals  To be able to use my arms with less pain.    Currently in Pain?  Yes    Pain Score  2     Pain Location  Shoulder    Pain Orientation  Right;Left     Pain Descriptors / Indicators  Aching;Sore    Pain Type  Acute pain    Pain Radiating Towards  neck at times    Pain Onset  More than a month ago    Pain Frequency  Intermittent    Aggravating Factors   movement, use    Pain Relieving Factors  rest, heat    Effect of Pain on Daily Activities  mod effect on ADLs    Multiple Pain Sites  No        [facility] OT Assessment - 07/13/19 1345      Assessment   Medical Diagnosis  bilateral shoulder pain    Referring Provider (OT)  Dr. Sydnee Cabal    Onset Date/Surgical Date  05/23/19    Hand Dominance  Right    Next MD Visit  08/18/19    Prior Therapy  None for this issue      Precautions   Precautions  None      Restrictions   Weight Bearing Restrictions  No      Balance Screen   Has the patient fallen  in the past 6 months  Yes    How many times?  1    Has the patient had a decrease in activity level because of a fear of falling?   No    Is the patient reluctant to leave their home because of a fear of falling?   No      Prior Function   Level of Independence  Independent    Vocation  Full time employment    Vocation Requirements  RN-lifting patients, moving, reaching    Leisure  pottery      ADL   ADL comments  Pt is having difficulty with reaching, sleeping, donning bra. Pt having nerve symptoms at times-tingling down arms.       Written Expression   Dominant Hand  Right      Cognition   Overall Cognitive Status  Within Functional Limits for tasks assessed      Observation/Other Assessments   Focus on Therapeutic Outcomes (FOTO)   Complete next session      ROM / Strength   AROM / PROM / Strength  AROM;PROM;Strength      Palpation   Palpation comment  mod fascial restrictions in bilateral upper arms, trapezius, and anterior shoulder regions      AROM   Overall AROM Comments  Assessed seated, er/IR abducted    AROM Assessment Site  Shoulder    Right/Left Shoulder  Right;Left    Right Shoulder Flexion  136 Degrees     Right Shoulder ABduction  92 Degrees    Right Shoulder Internal Rotation  90 Degrees    Right Shoulder External Rotation  64 Degrees    Left Shoulder Flexion  135 Degrees    Left Shoulder ABduction  123 Degrees    Left Shoulder Internal Rotation  90 Degrees    Left Shoulder External Rotation  72 Degrees      PROM   Overall PROM Comments  Assessed supine, er/IR abducted    PROM Assessment Site  Shoulder    Right/Left Shoulder  Right;Left    Right Shoulder Flexion  165 Degrees    Right Shoulder ABduction  170 Degrees    Right Shoulder Internal Rotation  90 Degrees    Right Shoulder External Rotation  90 Degrees    Left Shoulder Flexion  160 Degrees    Left Shoulder ABduction  170 Degrees    Left Shoulder Internal Rotation  90 Degrees    Left Shoulder External Rotation  90 Degrees      Strength   Overall Strength Comments  Assessed seated, er/IR adducted    Strength Assessment Site  Shoulder    Right/Left Shoulder  Right;Left    Right Shoulder Flexion  4-/5    Right Shoulder ABduction  4-/5    Right Shoulder Internal Rotation  5/5    Right Shoulder External Rotation  4/5    Left Shoulder Flexion  4/5    Left Shoulder ABduction  4/5    Left Shoulder Internal Rotation  5/5    Left Shoulder External Rotation  4/5                      OT Education - 07/13/19 1414    Education Details  shoulder stretches    Person(s) Educated  Patient    Methods  Explanation;Demonstration;Handout    Comprehension  Verbalized understanding;Returned demonstration       OT Short Term Goals - 07/13/19 1528  OT SHORT TERM GOAL #1   Title  A: Pt will be educated on and independent in HEP to increase ability to perform ADLs without using compensatory strategies.    Time  4    Period  Weeks    Status  New    Target Date  08/12/19      OT SHORT TERM GOAL #2   Title  Pt will increase BUE A/ROM to [facility] to improve ability to donn bra and operate clasp.    Time  4    Period   Weeks    Status  New      OT SHORT TERM GOAL #3   Title  Pt will decrease fascial restrictions in LUE from mod to trace amounts or less to improve mobility required for overhead reaching.    Time  4    Period  Weeks    Status  New      OT SHORT TERM GOAL #4   Title  Pt will decrease pain to 2/10 or less to improve ability to sleep for 4 consecutive hours or more.    Time  4    Period  Weeks    Status  New      OT SHORT TERM GOAL #5   Title  Pt will increase strength to 4+/5 or greater to increase ability to perform typical daily work tasks.    Time  4    Period  Weeks    Status  New               Plan - 07/13/19 1525    Clinical Impression Statement  A: Pt is a 44 y/o female s/p fall on 05/23/19 resulting in bilateral shoulder pain secondary to RC strain (LUE) and tearing (RUE). 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 Pt reporting pain limiting reaching and lifting tasks, decreased strength and ROM in BUE.    OT Occupational Profile and History  Problem Focused Assessment - Including review of records relating to presenting problem    Occupational performance deficits (Please refer to evaluation for details):  ADL's;IADL's;Rest and Sleep;Work;Leisure    Body Structure / Function / Physical Skills  ADL;Endurance;UE functional use;Fascial restriction;Pain;ROM;IADL;Strength    Rehab Potential  Good    Clinical Decision Making  Limited treatment options, no task modification necessary    Comorbidities Affecting Occupational Performance:  None    Modification or Assistance to Complete Evaluation   No modification of tasks or assist necessary to complete eval    OT Frequency  2x / week    OT Duration  4 weeks    OT Treatment/Interventions  Self-care/ADL training;Ultrasound;Patient/family education;Passive range of motion;Cryotherapy;Electrical Stimulation;Moist Heat;Therapeutic exercise;Manual Therapy;Therapeutic activities    Plan  P: Pt will benefit from  skilled OT services to decrease pain and fascial restrictions, increase joint ROM, strength, and functional use of BUE during ADLs. Treatment plan: myofascial release and manual techniques, P/ROM, A/ROM, general BUE strengthening, scapular stability and strengthening,  modalities prn    OT Home Exercise Plan  3/30: shoulder stretches    Consulted and Agree with Plan of Care  Patient       Patient will benefit from skilled therapeutic intervention in order to improve the following deficits and impairments:   Body Structure / Function / Physical Skills: ADL, Endurance, UE functional use, Fascial restriction, Pain, ROM, IADL, Strength       Visit Diagnosis: Acute pain of right shoulder  Acute pain of left shoulder  Other symptoms and signs involving the musculoskeletal system    Problem List Patient Active Problem List   Diagnosis Date Noted  . Palpitations 07/26/2011   Guadelupe Sabin, OTR/L  [phone] 07/13/2019, 3:32 PM  [facility] [address] [place], [place], [address] Phone: [phone]   Fax:  [phone]  Name: TRACHELLE BIANCHI MRN: [id number] Date of Birth: 1975-07-24

## 2019-07-13 NOTE — Patient Instructions (Signed)
  1) Flexion Wall Stretch    Face wall, place affected handon wall in front of you. Slide hand up the wall  and lean body in towards the wall. Hold for 10 seconds. Repeat 3-5 times. 1-2 times/day.     2) Towel Stretch with Internal Rotation     Gently pull up (or to the side) your affected arm  behind your back with the assist of a towel. Hold 10 seconds, repeat 3-5 times. 1-2 times/day.             3) Corner Stretch    Stand at a corner of a wall, place your arms on the walls with elbows bent. Lean into the corner until a stretch is felt along the front of your chest and/or shoulders. Hold for 10 seconds. Repeat 3-5X, 1-2 times/day.    4) Posterior Capsule Stretch    Bring the involved arm across chest. Grasp elbow and pull toward chest until you feel a stretch in the back of the upper arm and shoulder. Hold 10 seconds. Repeat 3-5X. Complete 1-2 times/day.    5) Scapular Retraction    Tuck chin back as you pinch shoulder blades together.  Hold 5 seconds. Repeat 3-5X. Complete 1-2 times/day.    6) Abduction stretch  While standing next to wall, place your affected arm as pictured, then lean towards wall to stretch shoulder. Hold 10 seconds. Repeat 3-5X. Complete 1-2 times/day.

## 2019-07-15 ENCOUNTER — Other Ambulatory Visit: Payer: Self-pay

## 2019-07-15 ENCOUNTER — Ambulatory Visit (HOSPITAL_COMMUNITY): Payer: PRIVATE HEALTH INSURANCE | Attending: Physician Assistant | Admitting: Occupational Therapy

## 2019-07-15 ENCOUNTER — Encounter (HOSPITAL_COMMUNITY): Payer: Self-pay | Admitting: Occupational Therapy

## 2019-07-15 DIAGNOSIS — M25512 Pain in left shoulder: Secondary | ICD-10-CM | POA: Diagnosis present

## 2019-07-15 DIAGNOSIS — R29898 Other symptoms and signs involving the musculoskeletal system: Secondary | ICD-10-CM | POA: Insufficient documentation

## 2019-07-15 DIAGNOSIS — M25511 Pain in right shoulder: Secondary | ICD-10-CM | POA: Insufficient documentation

## 2019-07-15 NOTE — Therapy (Signed)
[facility] [address] [place], [place], [address] Phone: [phone]   Fax:  [phone]  Occupational Therapy Treatment  Patient Details  Name: [name] MRN: [id number] Date of Birth: 09/11/1975 Referring Provider (OT): Dr. Sydnee Cabal   Encounter Date: 07/15/2019  OT End of Session - 07/15/19 1026    Visit Number  2    Number of Visits  8    Date for OT Re-Evaluation  08/12/19    Authorization Type  [employer]    Authorization Time Period  1 eval and 12 visits approved    Authorization - Visit Number  2    Authorization - Number of Visits  13    Progress Note Due on Visit  10    OT Start Time  [phone]    OT Stop Time  1027    OT Time Calculation (min)  41 min    Activity Tolerance  Patient tolerated treatment well    Behavior During Therapy  [facility] for tasks assessed/performed       Past Medical History:  Diagnosis Date  . Back pain   . GERD (gastroesophageal reflux disease)   . Palpitations   . PONV (postoperative nausea and vomiting)   . Spinal headache     Past Surgical History:  Procedure Laterality Date  . CESAREAN SECTION    . Left knee arthroscopic surgery    . WISDOM TOOTH EXTRACTION      There were no vitals filed for this visit.  Subjective Assessment - 07/15/19 0947    Subjective   S: I took a muscle relaxer last night.    Currently in Pain?  No/denies         [facility] OT Assessment - 07/15/19 0946      Assessment   Medical Diagnosis  bilateral shoulder pain      Precautions   Precautions  None               OT Treatments/Exercises (OP) - 07/15/19 0949      Exercises   Exercises  Shoulder      Shoulder Exercises: Supine   Protraction  PROM;5 reps;AROM;12 reps    Horizontal ABduction  PROM;5 reps;AROM;12 reps    External Rotation  PROM;5 reps;AROM;12 reps   abducted   Internal Rotation  PROM;5 reps;AROM;12 reps   abducted   Flexion  PROM;5 reps;AROM;12 reps     ABduction  PROM;5 reps;AROM;12 reps      Shoulder Exercises: Standing   Extension  Theraband;10 reps    Theraband Level (Shoulder Extension)  Level 2 (Red)    Row  Theraband;10 reps    Theraband Level (Shoulder Row)  Level 2 (Red)    Retraction  Theraband;10 reps    Theraband Level (Shoulder Retraction)  Level 2 (Red)      Shoulder Exercises: ROM/Strengthening   UBE (Upper Arm Bike)  Level 1 2' forward 2' reverse    Proximal Shoulder Strengthening, Supine  10X each, A/ROM      Manual Therapy   Manual Therapy  Myofascial release    Manual therapy comments  manual therapy completed prior to therapeutic exercises    Myofascial Release  myofascial release to bilateral upper arms, anterior shoulder, trapezius, and scapular regions to decrease pain and fascial restrictions and increase joint ROM.               OT Short Term Goals - 07/15/19 1025      OT  SHORT TERM GOAL #1   Title  A: Pt will be educated on and independent in HEP to increase ability to perform ADLs without using compensatory strategies.    Time  4    Period  Weeks    Status  On-going    Target Date  08/12/19      OT SHORT TERM GOAL #2   Title  Pt will increase BUE A/ROM to [facility] to improve ability to donn bra and operate clasp.    Time  4    Period  Weeks    Status  On-going      OT SHORT TERM GOAL #3   Title  Pt will decrease fascial restrictions in LUE from mod to trace amounts or less to improve mobility required for overhead reaching.    Time  4    Period  Weeks    Status  On-going      OT SHORT TERM GOAL #4   Title  Pt will decrease pain to 2/10 or less to improve ability to sleep for 4 consecutive hours or more.    Time  4    Period  Weeks    Status  On-going      OT SHORT TERM GOAL #5   Title  Pt will increase strength to 4+/5 or greater to increase ability to perform typical daily work tasks.    Time  4    Period  Weeks    Status  On-going               Plan - 07/15/19 1007     Clinical Impression Statement  A: Pt reports she has been doing her stretches and putting her bra on this morning was easier. Initiated myofascial release and passive stretching, as well as A/ROM exercises this session. Completed scapular theraband and UBE. Verbal cuing for form and technique.    Body Structure / Function / Physical Skills  ADL;Endurance;UE functional use;Fascial restriction;Pain;ROM;IADL;Strength    Plan  P: Complete A/ROM in standing and add x to v arms. Update HEP    OT Home Exercise Plan  3/30: shoulder stretches    Consulted and Agree with Plan of Care  Patient       Patient will benefit from skilled therapeutic intervention in order to improve the following deficits and impairments:   Body Structure / Function / Physical Skills: ADL, Endurance, UE functional use, Fascial restriction, Pain, ROM, IADL, Strength       Visit Diagnosis: Acute pain of right shoulder  Acute pain of left shoulder  Other symptoms and signs involving the musculoskeletal system    Problem List Patient Active Problem List   Diagnosis Date Noted  . Palpitations 07/26/2011   Guadelupe Sabin, OTR/L  [phone] 07/15/2019, 10:28 AM  [facility] [address] [place], [place], [address] Phone: [phone]   Fax:  [phone]  Name: DONDA DEMORET MRN: [id number] Date of Birth: June 11, 1975

## 2019-07-20 ENCOUNTER — Encounter (HOSPITAL_COMMUNITY): Payer: Self-pay | Admitting: Occupational Therapy

## 2019-07-20 ENCOUNTER — Ambulatory Visit (HOSPITAL_COMMUNITY): Payer: PRIVATE HEALTH INSURANCE | Admitting: Occupational Therapy

## 2019-07-20 ENCOUNTER — Other Ambulatory Visit: Payer: Self-pay

## 2019-07-20 DIAGNOSIS — R29898 Other symptoms and signs involving the musculoskeletal system: Secondary | ICD-10-CM

## 2019-07-20 DIAGNOSIS — M25511 Pain in right shoulder: Secondary | ICD-10-CM | POA: Diagnosis not present

## 2019-07-20 DIAGNOSIS — M25512 Pain in left shoulder: Secondary | ICD-10-CM

## 2019-07-20 NOTE — Therapy (Signed)
[place], [place], [address] Phone: [phone]   Fax:  [phone]  Occupational Therapy Treatment  Patient Details  Name: [name] MRN: [id number] Date of Birth: 10/12/1975 Referring Provider (OT): Dr. Sydnee Cabal   Encounter Date: 07/20/2019  OT End of Session - 07/20/19 1602    Visit Number  3    Number of Visits  8    Date for OT Re-Evaluation  08/12/19    Authorization Type  [employer]    Authorization Time Period  1 eval and 12 visits approved    Authorization - Visit Number  3    Authorization - Number of Visits  13    Progress Note Due on Visit  10    OT Start Time  1519    OT Stop Time  1600    OT Time Calculation (min)  41 min    Activity Tolerance  Patient tolerated treatment well    Behavior During Therapy  [facility] for tasks assessed/performed       Past Medical History:  Diagnosis Date  . Back pain   . GERD (gastroesophageal reflux disease)   . Palpitations   . PONV (postoperative nausea and vomiting)   . Spinal headache     Past Surgical History:  Procedure Laterality Date  . CESAREAN SECTION    . Left knee arthroscopic surgery    . WISDOM TOOTH EXTRACTION      There were no vitals filed for this visit.  Subjective Assessment - 07/20/19 1519    Subjective   S: I got a catch in my right shoulder.    Currently in Pain?  No/denies         [facility] OT Assessment - 07/20/19 1519      Assessment   Medical Diagnosis  bilateral shoulder pain      Precautions   Precautions  None               OT Treatments/Exercises (OP) - 07/20/19 1521      Exercises   Exercises  Shoulder      Shoulder Exercises: Supine   Protraction  PROM;5 reps    Horizontal ABduction  PROM;5 reps    External Rotation  PROM;5 reps   abducted   Internal Rotation  PROM;5 reps   abducted   Flexion  PROM;5 reps    ABduction  PROM;5 reps      Shoulder Exercises: Standing   Protraction  AROM;10 reps    Horizontal ABduction  AROM;10 reps    External Rotation  AROM;10 reps    Internal Rotation  AROM;10 reps    Flexion  AROM;10 reps    ABduction  AROM;10 reps    Extension  Theraband;10 reps    Theraband Level (Shoulder Extension)  Level 3 (Green)    Row  [employer] reps    Theraband Level (Shoulder Row)  Level 3 (Green)    Retraction  Theraband;10 reps    Theraband Level (Shoulder Retraction)  Level 3 (Green)      Shoulder Exercises: ROM/Strengthening   X to V Arms  10X    Proximal Shoulder Strengthening, Seated  10X each no rest breaks    Ball on Wall  1' flexion and abduction BUE, green weighted ball      Manual Therapy   Manual Therapy  Myofascial release    Manual therapy comments  manual therapy completed prior to therapeutic exercises    Myofascial  Release  myofascial release to bilateral upper arms, anterior shoulder, trapezius, and scapular regions to decrease pain and fascial restrictions and increase joint ROM.             OT Education - 07/20/19 1539    Education Details  A/ROM shoulder in standing    Person(s) Educated  Patient    Methods  Explanation;Demonstration;Handout    Comprehension  Verbalized understanding;Returned demonstration       OT Short Term Goals - 07/15/19 1025      OT SHORT TERM GOAL #1   Title  A: Pt will be educated on and independent in HEP to increase ability to perform ADLs without using compensatory strategies.    Time  4    Period  Weeks    Status  On-going    Target Date  08/12/19      OT SHORT TERM GOAL #2   Title  Pt will increase BUE A/ROM to [place] to improve ability to donn bra and operate clasp.    Time  4    Period  Weeks    Status  On-going      OT SHORT TERM GOAL #3   Title  Pt will decrease fascial restrictions in LUE from mod to trace amounts or less to improve mobility required for overhead reaching.    Time  4    Period  Weeks    Status  On-going      OT SHORT TERM GOAL #4    Title  Pt will decrease pain to 2/10 or less to improve ability to sleep for 4 consecutive hours or more.    Time  4    Period  Weeks    Status  On-going      OT SHORT TERM GOAL #5   Title  Pt will increase strength to 4+/5 or greater to increase ability to perform typical daily work tasks.    Time  4    Period  Weeks    Status  On-going               Plan - 07/20/19 1539    Clinical Impression Statement  A: Pt reports catching in right shoulder over the weekend, never popped. Continued with manual therapy and P/ROM, completed A/ROM in standing today. Added x to v arms and ball on wall for scapular strengthening. Upgraded scapular theraband to green resistance. Verbal cuing for form and technique.    Body Structure / Function / Physical Skills  ADL;Endurance;UE functional use;Fascial restriction;Pain;ROM;IADL;Strength    Plan  P: Follow up on HEP, add therapy ball exercises    OT Home Exercise Plan  3/30: shoulder stretches; 4/6: A/ROM shoulder       Patient will benefit from skilled therapeutic intervention in order to improve the following deficits and impairments:   Body Structure / Function / Physical Skills: ADL, Endurance, UE functional use, Fascial restriction, Pain, ROM, IADL, Strength       Visit Diagnosis: Acute pain of right shoulder  Acute pain of left shoulder  Other symptoms and signs involving the musculoskeletal system    Problem List Patient Active Problem List   Diagnosis Date Noted  . Palpitations 07/26/2011   Guadelupe Sabin, OTR/L  [phone] 07/20/2019, 4:03 PM  [facility] [address] [place], [place], [address] Phone: [phone]   Fax:  [phone]  Name: [name] MRN: [id number] Date of Birth: 12-26-1975

## 2019-07-20 NOTE — Patient Instructions (Signed)

## 2019-07-22 ENCOUNTER — Ambulatory Visit (HOSPITAL_COMMUNITY): Payer: PRIVATE HEALTH INSURANCE | Admitting: Occupational Therapy

## 2019-07-22 ENCOUNTER — Other Ambulatory Visit: Payer: Self-pay

## 2019-07-22 ENCOUNTER — Encounter (HOSPITAL_COMMUNITY): Payer: Self-pay | Admitting: Occupational Therapy

## 2019-07-22 DIAGNOSIS — R29898 Other symptoms and signs involving the musculoskeletal system: Secondary | ICD-10-CM

## 2019-07-22 DIAGNOSIS — M25512 Pain in left shoulder: Secondary | ICD-10-CM

## 2019-07-22 DIAGNOSIS — M25511 Pain in right shoulder: Secondary | ICD-10-CM | POA: Diagnosis not present

## 2019-07-22 NOTE — Therapy (Signed)
[facility] [address] [place], [place], [address] Phone: [phone]   Fax:  [phone]  Occupational Therapy Treatment  Patient Details  Name: [name] MRN: [id number] Date of Birth: [birth year] Referring Provider (OT): Dr. Sydnee Cabal   Encounter Date: 07/22/2019  OT End of Session - 07/22/19 1200    Visit Number  4    Number of Visits  8    Date for OT Re-Evaluation  08/12/19    Authorization Type  [employer]    Authorization Time Period  1 eval and 12 visits approved    Authorization - Visit Number  4    Authorization - Number of Visits  13    Progress Note Due on Visit  10    OT Start Time  1119    OT Stop Time  1200    OT Time Calculation (min)  41 min    Activity Tolerance  Patient tolerated treatment well    Behavior During Therapy  [facility] for tasks assessed/performed       Past Medical History:  Diagnosis Date  . Back pain   . GERD (gastroesophageal reflux disease)   . Palpitations   . PONV (postoperative nausea and vomiting)   . Spinal headache     Past Surgical History:  Procedure Laterality Date  . CESAREAN SECTION    . Left knee arthroscopic surgery    . WISDOM TOOTH EXTRACTION      There were no vitals filed for this visit.  Subjective Assessment - 07/22/19 1120    Subjective   S: The right one isn't liking life today.    Currently in Pain?  Yes    Pain Score  2     Pain Location  Shoulder    Pain Orientation  Right    Pain Descriptors / Indicators  Aching;Sore    Pain Type  Acute pain    Pain Radiating Towards  neck    Pain Onset  More than a month ago    Pain Frequency  Intermittent    Aggravating Factors   movement, use    Pain Relieving Factors  rest, heat    Effect of Pain on Daily Activities  mod effect on ADLs    Multiple Pain Sites  No         [facility] OT Assessment - 07/22/19 1120      Assessment   Medical Diagnosis  bilateral shoulder pain      Precautions    Precautions  None               OT Treatments/Exercises (OP) - 07/22/19 1121      Exercises   Exercises  Shoulder      Shoulder Exercises: Supine   Protraction  PROM;5 reps    Horizontal ABduction  PROM;5 reps    External Rotation  PROM;5 reps   abducted   Internal Rotation  PROM;5 reps   abducted   Flexion  PROM;5 reps    ABduction  PROM;5 reps      Shoulder Exercises: Standing   Protraction  Strengthening;10 reps    Protraction Weight (lbs)  1    Horizontal ABduction  Strengthening;10 reps    Horizontal ABduction Weight (lbs)  1    External Rotation  AROM;10 reps   abducted   External Rotation Weight (lbs)  --    Internal Rotation  AROM;10 reps   abducted   Internal Rotation Weight (lbs)  --  Flexion  Strengthening;10 reps    Shoulder Flexion Weight (lbs)  1    ABduction  Strengthening;10 reps    Shoulder ABduction Weight (lbs)  1    Extension  Theraband;12 reps    Theraband Level (Shoulder Extension)  Level 3 (Green)    Row  [employer] reps    Theraband Level (Shoulder Row)  Level 3 (Green)    Retraction  Theraband;12 reps    Theraband Level (Shoulder Retraction)  Level 3 (Green)    Other Standing Exercises  Y lift off, 10X      Shoulder Exercises: ROM/Strengthening   Over Head Lace  1' each UE    X to V Arms  10X    Ball on Wall  1' flexion and abduction BUE, green weighted ball      Shoulder Exercises: Stretch   Other Shoulder Stretches  pectoralis stretch BUE, 3X      Manual Therapy   Manual Therapy  Myofascial release    Manual therapy comments  manual therapy completed prior to therapeutic exercises    Myofascial Release  myofascial release to bilateral upper arms, anterior shoulder, trapezius, and scapular regions to decrease pain and fascial restrictions and increase joint ROM.               OT Short Term Goals - 07/15/19 1025      OT SHORT TERM GOAL #1   Title  A: Pt will be educated on and independent in HEP to increase  ability to perform ADLs without using compensatory strategies.    Time  4    Period  Weeks    Status  On-going    Target Date  08/12/19      OT SHORT TERM GOAL #2   Title  Pt will increase BUE A/ROM to [place] to improve ability to donn bra and operate clasp.    Time  4    Period  Weeks    Status  On-going      OT SHORT TERM GOAL #3   Title  Pt will decrease fascial restrictions in LUE from mod to trace amounts or less to improve mobility required for overhead reaching.    Time  4    Period  Weeks    Status  On-going      OT SHORT TERM GOAL #4   Title  Pt will decrease pain to 2/10 or less to improve ability to sleep for 4 consecutive hours or more.    Time  4    Period  Weeks    Status  On-going      OT SHORT TERM GOAL #5   Title  Pt will increase strength to 4+/5 or greater to increase ability to perform typical daily work tasks.    Time  4    Period  Weeks    Status  On-going               Plan - 07/22/19 1149    Clinical Impression Statement  A: Pt reports right shoulder is painful today, OT notes tenderness in pectoralis region and at clavicle. Progressed to 1# weights today for all motions except er/IR. Added corner stretch, pectoralis stretch, and er stretch today. Also added Y lift off and overhead lacing today. Verbal cuing for form and technique.    Body Structure / Function / Physical Skills  ADL;Endurance;UE functional use;Fascial restriction;Pain;ROM;IADL;Strength    Plan  P: Trial Massage gun for trapezius fascial restrictions, therapy ball exercises  OT Home Exercise Plan  3/30: shoulder stretches; 4/6: A/ROM shoulder    Consulted and Agree with Plan of Care  Patient       Patient will benefit from skilled therapeutic intervention in order to improve the following deficits and impairments:   Body Structure / Function / Physical Skills: ADL, Endurance, UE functional use, Fascial restriction, Pain, ROM, IADL, Strength       Visit Diagnosis: Acute  pain of right shoulder  Acute pain of left shoulder  Other symptoms and signs involving the musculoskeletal system    Problem List Patient Active Problem List   Diagnosis Date Noted  . Palpitations 07/26/2011   [name], [name]  [phone] 07/22/2019, 12:01 PM  [facility] [address] [place], [place], [address] Phone: [phone]   Fax:  [phone]  Name: [name] MRN: [id number] Date of Birth: [birth year]

## 2019-07-27 ENCOUNTER — Other Ambulatory Visit: Payer: Self-pay

## 2019-07-27 ENCOUNTER — Ambulatory Visit (HOSPITAL_COMMUNITY): Payer: PRIVATE HEALTH INSURANCE | Admitting: Occupational Therapy

## 2019-07-27 ENCOUNTER — Encounter (HOSPITAL_COMMUNITY): Payer: Self-pay | Admitting: Occupational Therapy

## 2019-07-27 DIAGNOSIS — M25511 Pain in right shoulder: Secondary | ICD-10-CM

## 2019-07-27 DIAGNOSIS — R29898 Other symptoms and signs involving the musculoskeletal system: Secondary | ICD-10-CM

## 2019-07-27 DIAGNOSIS — M25512 Pain in left shoulder: Secondary | ICD-10-CM

## 2019-07-27 NOTE — Therapy (Signed)
[facility] [address] [place], [place], [address] Phone: [phone]   Fax:  [phone]  Occupational Therapy Treatment  Patient Details  Name: [name] MRN: [id number] Date of Birth: Aug 20, 1975 Referring Provider (OT): Dr. Sydnee Cabal   Encounter Date: 07/27/2019  OT End of Session - 07/27/19 1155    Visit Number  5    Number of Visits  8    Date for OT Re-Evaluation  08/12/19    Authorization Type  [employer]    Authorization Time Period  1 eval and 12 visits approved    Authorization - Visit Number  5    Authorization - Number of Visits  13    Progress Note Due on Visit  10    OT Start Time  1116    OT Stop Time  1156    OT Time Calculation (min)  40 min    Activity Tolerance  Patient tolerated treatment well    Behavior During Therapy  [facility] for tasks assessed/performed       Past Medical History:  Diagnosis Date  . Back pain   . GERD (gastroesophageal reflux disease)   . Palpitations   . PONV (postoperative nausea and vomiting)   . Spinal headache     Past Surgical History:  Procedure Laterality Date  . CESAREAN SECTION    . Left knee arthroscopic surgery    . WISDOM TOOTH EXTRACTION      There were no vitals filed for this visit.  Subjective Assessment - 07/27/19 1116    Subjective   S: The right one feels almost like a nerve thing.    Currently in Pain?  No/denies         [facility] OT Assessment - 07/27/19 1115      Assessment   Medical Diagnosis  bilateral shoulder pain      Precautions   Precautions  None               OT Treatments/Exercises (OP) - 07/27/19 1118      Exercises   Exercises  Shoulder;Neck      Neck Exercises: Stretches   Levator Stretch  3 reps;10 seconds    Neck Stretch  3 reps;10 seconds    Lower Cervical/Upper Thoracic Stretch  3 reps;10 seconds      Neck Exercises: Standing   Neck Retraction  10 reps      Shoulder Exercises: Standing   Protraction  Strengthening;10 reps    Protraction Weight (lbs)  1    Horizontal ABduction  Strengthening;10 reps    Horizontal ABduction Weight (lbs)  1    External Rotation  Strengthening;10 reps   abducted   External Rotation Weight (lbs)  1    Internal Rotation  Strengthening;10 reps   abducted   Internal Rotation Weight (lbs)  1    Flexion  Strengthening;10 reps    Shoulder Flexion Weight (lbs)  1    ABduction  Strengthening;10 reps    Shoulder ABduction Weight (lbs)  1    Other Standing Exercises  Y lift off, 10X      Shoulder Exercises: Therapy Ball   Other Therapy Ball Exercises  green multi-colored ball: chest press, flexion, circles each direction, diagonals, 10X each      Shoulder Exercises: ROM/Strengthening   X to V Arms  10X, 1#      Shoulder Exercises: Stretch   Other Shoulder Stretches  pectoralis stretch BUE, 3X, 10" holds  Manual Therapy   Manual Therapy  Other (comment)   massage gun   Manual therapy comments  manual therapy completed prior to therapeutic exercises    Other Manual Therapy  Massage gun utilized for bilateral trapezius and scapular regions to decrease trigger points and pain and improve tolerance to exercises              OT Education - 07/27/19 1133    Education Details  cervical neck stretches    Person(s) Educated  Patient    Methods  Explanation;Demonstration;Handout    Comprehension  Verbalized understanding;Returned demonstration       OT Short Term Goals - 07/15/19 1025      OT SHORT TERM GOAL #1   Title  A: Pt will be educated on and independent in HEP to increase ability to perform ADLs without using compensatory strategies.    Time  4    Period  Weeks    Status  On-going    Target Date  08/12/19      OT SHORT TERM GOAL #2   Title  Pt will increase BUE A/ROM to [facility] to improve ability to donn bra and operate clasp.    Time  4    Period  Weeks    Status  On-going      OT SHORT TERM GOAL #3   Title  Pt will  decrease fascial restrictions in LUE from mod to trace amounts or less to improve mobility required for overhead reaching.    Time  4    Period  Weeks    Status  On-going      OT SHORT TERM GOAL #4   Title  Pt will decrease pain to 2/10 or less to improve ability to sleep for 4 consecutive hours or more.    Time  4    Period  Weeks    Status  On-going      OT SHORT TERM GOAL #5   Title  Pt will increase strength to 4+/5 or greater to increase ability to perform typical daily work tasks.    Time  4    Period  Weeks    Status  On-going               Plan - 07/27/19 1156    Clinical Impression Statement  A: Pt reports radicular pain in right shoulder down to fingers today. Trialed massage gun at pt's request with good trigger point response. Continued with strengthening, added therapy ball exercises and cervical neck stretches today. Verbal cuing for form and technique.    Body Structure / Function / Physical Skills  ADL;Endurance;UE functional use;Fascial restriction;Pain;ROM;IADL;Strength    Plan  P: Continue with massage gun, therapy ball exercises, attempt loop band    OT Home Exercise Plan  3/30: shoulder stretches; 4/6: A/ROM shoulder       Patient will benefit from skilled therapeutic intervention in order to improve the following deficits and impairments:   Body Structure / Function / Physical Skills: ADL, Endurance, UE functional use, Fascial restriction, Pain, ROM, IADL, Strength       Visit Diagnosis: Acute pain of right shoulder  Acute pain of left shoulder  Other symptoms and signs involving the musculoskeletal system    Problem List Patient Active Problem List   Diagnosis Date Noted  . Palpitations 07/26/2011   Guadelupe Sabin, OTR/L  [phone] 07/27/2019, 11:59 AM  [place] [place], [place], [address] Phone:  [phone]   Fax:  [phone]  Name: DONELLE BALKA MRN: [id number] Date  of Birth: Aug 06, 1975

## 2019-07-27 NOTE — Patient Instructions (Signed)
1) Flexibility: Neck Stretch   Grasp left arm above wrist and pull down across body while gently tilting head same direction. Hold for 3 seconds. Repeat 10 times.   2) Levator Scapula Stretch   Place left hand on same side shoulder blade. With other hand, gently stretch head down and away. Hold 3 seconds. Repeat 10 times.   3) Flexibility: Neck Stretch   Grasp left arm above wrist and pull down across body while gently tilting head same direction. Hold 3 seconds. Repeat 10 times.  4) Flexibility: Neck Retraction   Pull head straight back, keeping eyes and jaw level. *Give yourself a double chin.* Hold 3 seconds. Repeat 10 times.   5) Lower Cervical / Upper Thoracic Stretch   Clasp hands together in front with arms extended. Gently pull shoulder blades apart and bend head forward. Hold 3 seconds. Repeat 10 times.       

## 2019-07-29 ENCOUNTER — Encounter (HOSPITAL_COMMUNITY): Payer: Self-pay | Admitting: Occupational Therapy

## 2019-07-29 ENCOUNTER — Other Ambulatory Visit: Payer: Self-pay

## 2019-07-29 ENCOUNTER — Ambulatory Visit (HOSPITAL_COMMUNITY): Payer: PRIVATE HEALTH INSURANCE | Admitting: Occupational Therapy

## 2019-07-29 DIAGNOSIS — M25512 Pain in left shoulder: Secondary | ICD-10-CM

## 2019-07-29 DIAGNOSIS — M25511 Pain in right shoulder: Secondary | ICD-10-CM | POA: Diagnosis not present

## 2019-07-29 DIAGNOSIS — R29898 Other symptoms and signs involving the musculoskeletal system: Secondary | ICD-10-CM

## 2019-07-29 NOTE — Therapy (Signed)
[place], [place], [address] Phone: [phone]   Fax:  [phone]  Occupational Therapy Treatment  Patient Details  Name: [name] MRN: [id number] Date of Birth: 12/22/1975 Referring Provider (OT): Dr. Sydnee Cabal   Encounter Date: 07/29/2019  OT End of Session - 07/29/19 1033    Visit Number  6    Number of Visits  8    Date for OT Re-Evaluation  08/12/19    Authorization Type  [employer]    Authorization Time Period  1 eval and 12 visits approved    Authorization - Visit Number  6    Authorization - Number of Visits  13    Progress Note Due on Visit  10    OT Start Time  [phone]    OT Stop Time  1031    OT Time Calculation (min)  43 min    Activity Tolerance  Patient tolerated treatment well    Behavior During Therapy  [facility] for tasks assessed/performed       Past Medical History:  Diagnosis Date  . Back pain   . GERD (gastroesophageal reflux disease)   . Palpitations   . PONV (postoperative nausea and vomiting)   . Spinal headache     Past Surgical History:  Procedure Laterality Date  . CESAREAN SECTION    . Left knee arthroscopic surgery    . WISDOM TOOTH EXTRACTION      There were no vitals filed for this visit.  Subjective Assessment - 07/29/19 0950    Subjective   S: I'm really sore today.    Currently in Pain?  Yes    Pain Score  3     Pain Location  Shoulder    Pain Orientation  Right;Left    Pain Descriptors / Indicators  Sore    Pain Type  Acute pain    Pain Radiating Towards  neck    Pain Onset  In the past 7 days    Pain Frequency  Intermittent    Aggravating Factors   movement, use    Pain Relieving Factors  rest, heat    Effect of Pain on Daily Activities  min effect on ADLs         [facility] OT Assessment - 07/29/19 0950      Assessment   Medical Diagnosis  bilateral shoulder pain      Precautions   Precautions  None                OT Treatments/Exercises (OP) - 07/29/19 0951      Exercises   Exercises  Shoulder;Neck      Neck Exercises: Stretches   Levator Stretch  2 reps;10 seconds    Neck Stretch  2 reps;10 seconds    Lower Cervical/Upper Thoracic Stretch  2 reps;10 seconds      Shoulder Exercises: Therapy Ball   Other Therapy Ball Exercises  green multi-colored ball: chest press, flexion, circles each direction, diagonals, 10X each      Shoulder Exercises: ROM/Strengthening   Ball on Wall  1' flexion and abduction BUE, green weighted ball    Other ROM/Strengthening Exercises  red loop band: lateral wall slides, wall slide with lift off, 10X      Manual Therapy   Manual Therapy  Other (comment);Myofascial release   massage gun   Manual therapy comments  manual therapy completed prior to therapeutic exercises    Myofascial Release  myofascial  release to bilateral upper arms, anterior shoulder, trapezius, and scapular regions to decrease pain and fascial restrictions and increase joint ROM.    Other Manual Therapy  Massage gun utilized for bilateral trapezius and scapular regions to decrease trigger points and pain and improve tolerance to exercises                OT Short Term Goals - 07/15/19 1025      OT SHORT TERM GOAL #1   Title  A: Pt will be educated on and independent in HEP to increase ability to perform ADLs without using compensatory strategies.    Time  4    Period  Weeks    Status  On-going    Target Date  08/12/19      OT SHORT TERM GOAL #2   Title  Pt will increase BUE A/ROM to [facility] to improve ability to donn bra and operate clasp.    Time  4    Period  Weeks    Status  On-going      OT SHORT TERM GOAL #3   Title  Pt will decrease fascial restrictions in LUE from mod to trace amounts or less to improve mobility required for overhead reaching.    Time  4    Period  Weeks    Status  On-going      OT SHORT TERM GOAL #4   Title  Pt will decrease pain to  2/10 or less to improve ability to sleep for 4 consecutive hours or more.    Time  4    Period  Weeks    Status  On-going      OT SHORT TERM GOAL #5   Title  Pt will increase strength to 4+/5 or greater to increase ability to perform typical daily work tasks.    Time  4    Period  Weeks    Status  On-going               Plan - 07/29/19 1034    Clinical Impression Statement  A: Pt reports soreness in bilateral shoulders, massage gun utilized as well as manual techniques to address trigger points. Continued with neck stretches, shoulder strengthening, and added red loop band stability exercises today. Pt with good form, slight increase in soreness during new exercises.  Verbal cuing for form and technique.    Body Structure / Function / Physical Skills  ADL;Endurance;UE functional use;Fascial restriction;Pain;ROM;IADL;Strength    Plan  P: Continue with loop band and add diagonal wall slides, update HEP for red loop band. Add body blade    OT Home Exercise Plan  3/30: shoulder stretches; 4/6: A/ROM shoulder    Consulted and Agree with Plan of Care  Patient       Patient will benefit from skilled therapeutic intervention in order to improve the following deficits and impairments:   Body Structure / Function / Physical Skills: ADL, Endurance, UE functional use, Fascial restriction, Pain, ROM, IADL, Strength       Visit Diagnosis: Acute pain of right shoulder  Acute pain of left shoulder  Other symptoms and signs involving the musculoskeletal system    Problem List Patient Active Problem List   Diagnosis Date Noted  . Palpitations 07/26/2011   Guadelupe Sabin, OTR/L  [phone] 07/29/2019, 10:36 AM  [place] [address] [place], [place], [address] Phone: [phone]   Fax:  [phone]  Name: ANNEKA MULLALY MRN: [id number] Date  of Birth: [birth year]

## 2019-08-03 ENCOUNTER — Encounter (HOSPITAL_COMMUNITY): Payer: Self-pay | Admitting: Occupational Therapy

## 2019-08-03 ENCOUNTER — Other Ambulatory Visit: Payer: Self-pay

## 2019-08-03 ENCOUNTER — Ambulatory Visit (HOSPITAL_COMMUNITY): Payer: PRIVATE HEALTH INSURANCE | Admitting: Occupational Therapy

## 2019-08-03 DIAGNOSIS — R29898 Other symptoms and signs involving the musculoskeletal system: Secondary | ICD-10-CM

## 2019-08-03 DIAGNOSIS — M25511 Pain in right shoulder: Secondary | ICD-10-CM

## 2019-08-03 DIAGNOSIS — M25512 Pain in left shoulder: Secondary | ICD-10-CM

## 2019-08-03 NOTE — Therapy (Signed)
[place], [place], [address] Phone: [phone]   Fax:  [phone]  Occupational Therapy Treatment  Patient Details  Name: [name] MRN: [id number] Date of Birth: 1975-05-02 Referring Provider (OT): Dr. Sydnee Cabal   Encounter Date: 08/03/2019  OT End of Session - 08/03/19 1156    Visit Number  7    Number of Visits  8    Date for OT Re-Evaluation  08/12/19    Authorization Type  [employer]    Authorization Time Period  1 eval and 12 visits approved    Authorization - Visit Number  7    Authorization - Number of Visits  13    Progress Note Due on Visit  10    OT Start Time  1118    OT Stop Time  1201    OT Time Calculation (min)  43 min    Activity Tolerance  Patient tolerated treatment well    Behavior During Therapy  [facility] for tasks assessed/performed       Past Medical History:  Diagnosis Date  . Back pain   . GERD (gastroesophageal reflux disease)   . Palpitations   . PONV (postoperative nausea and vomiting)   . Spinal headache     Past Surgical History:  Procedure Laterality Date  . CESAREAN SECTION    . Left knee arthroscopic surgery    . WISDOM TOOTH EXTRACTION      There were no vitals filed for this visit.  Subjective Assessment - 08/03/19 1120    Subjective   S: I had some nerve pain after the last session.    Currently in Pain?  Yes    Pain Score  2     Pain Location  Shoulder    Pain Orientation  Left    Pain Descriptors / Indicators  Shooting    Pain Type  Acute pain    Pain Radiating Towards  neck    Pain Onset  In the past 7 days    Pain Frequency  Intermittent    Aggravating Factors   movement, use    Pain Relieving Factors  rest, heat    Effect of Pain on Daily Activities  min effect on ADLs    Multiple Pain Sites  No         [facility] OT Assessment - 08/03/19 1120      Assessment   Medical Diagnosis  bilateral shoulder pain      Precautions    Precautions  None               OT Treatments/Exercises (OP) - 08/03/19 1121      Exercises   Exercises  Shoulder;Neck      Shoulder Exercises: Standing   Protraction  Strengthening;10 reps    Protraction Weight (lbs)  1    Horizontal ABduction  Strengthening;10 reps    Horizontal ABduction Weight (lbs)  1    External Rotation  Strengthening;10 reps   abducted   External Rotation Weight (lbs)  1    Internal Rotation  Strengthening;10 reps   abducted   Internal Rotation Weight (lbs)  1    Flexion  Strengthening;10 reps    Shoulder Flexion Weight (lbs)  1    ABduction  Strengthening;10 reps    Shoulder ABduction Weight (lbs)  1      Shoulder Exercises: ROM/Strengthening   X to V Arms  10X, 1#    Other ROM/Strengthening  Exercises  red loop band: lateral wall slides, wall slide with lift off, diagonal wall slides 10X      Shoulder Exercises: Stretch   Other Shoulder Stretches  pectoralis stretch LUE, 3X, 20" holds, lower and upper      Shoulder Exercises: Body Blade   Flexion  5 reps   BUE   ABduction  5 reps   BUE   External Rotation  5 reps   BUE   Internal Rotation  5 reps   BUE     Manual Therapy   Manual Therapy  Myofascial release    Manual therapy comments  manual therapy completed prior to therapeutic exercises    Myofascial Release  myofascial release to left upper arm, anterior shoulder, trapezius, and scapular regions to decrease pain and fascial restrictions and increase joint ROM.               OT Short Term Goals - 07/15/19 1025      OT SHORT TERM GOAL #1   Title  A: Pt will be educated on and independent in HEP to increase ability to perform ADLs without using compensatory strategies.    Time  4    Period  Weeks    Status  On-going    Target Date  08/12/19      OT SHORT TERM GOAL #2   Title  Pt will increase BUE A/ROM to [facility] to improve ability to donn bra and operate clasp.    Time  4    Period  Weeks    Status  On-going       OT SHORT TERM GOAL #3   Title  Pt will decrease fascial restrictions in LUE from mod to trace amounts or less to improve mobility required for overhead reaching.    Time  4    Period  Weeks    Status  On-going      OT SHORT TERM GOAL #4   Title  Pt will decrease pain to 2/10 or less to improve ability to sleep for 4 consecutive hours or more.    Time  4    Period  Weeks    Status  On-going      OT SHORT TERM GOAL #5   Title  Pt will increase strength to 4+/5 or greater to increase ability to perform typical daily work tasks.    Time  4    Period  Weeks    Status  On-going               Plan - 08/03/19 1151    Clinical Impression Statement  A: Manual therapy completed on left shoulder, pt with trigger point in pectoralis region with good response to manual techniques. Continued with strengthening using 1# weight in standing, added diagonals to loop band today. Resumed pectoralis stretch with good response. Added body blade for shoulder/scapular stability. Verbal cuing for form and technique.    Body Structure / Function / Physical Skills  ADL;Endurance;UE functional use;Fascial restriction;Pain;ROM;IADL;Strength    Plan  P: Continue with manual techniques as needed, shoulder and scapular strengthening, continue with body blade adding a hold at end range; update HEP for strengthening    OT Home Exercise Plan  3/30: shoulder stretches; 4/6: A/ROM shoulder       Patient will benefit from skilled therapeutic intervention in order to improve the following deficits and impairments:   Body Structure / Function / Physical Skills: ADL, Endurance, UE functional use, Fascial restriction, Pain, ROM,  IADL, Strength       Visit Diagnosis: Acute pain of right shoulder  Acute pain of left shoulder  Other symptoms and signs involving the musculoskeletal system    Problem List Patient Active Problem List   Diagnosis Date Noted  . Palpitations 07/26/2011   Guadelupe Sabin, OTR/L   [phone] 08/03/2019, 12:02 PM  [place] [address] [place], [place], [address] Phone: [phone]   Fax:  [phone]  Name: [name] MRN: [id number] Date of Birth: 09-Oct-1975

## 2019-08-05 ENCOUNTER — Ambulatory Visit (HOSPITAL_COMMUNITY): Payer: PRIVATE HEALTH INSURANCE | Admitting: Occupational Therapy

## 2019-08-05 ENCOUNTER — Encounter (HOSPITAL_COMMUNITY): Payer: Self-pay | Admitting: Occupational Therapy

## 2019-08-05 ENCOUNTER — Other Ambulatory Visit: Payer: Self-pay

## 2019-08-05 DIAGNOSIS — M25511 Pain in right shoulder: Secondary | ICD-10-CM

## 2019-08-05 DIAGNOSIS — M25512 Pain in left shoulder: Secondary | ICD-10-CM

## 2019-08-05 DIAGNOSIS — R29898 Other symptoms and signs involving the musculoskeletal system: Secondary | ICD-10-CM

## 2019-08-05 NOTE — Therapy (Signed)
[place], [place], [address] Phone: [phone]   Fax:  [phone]  Occupational Therapy Treatment  Patient Details  Name: [name] MRN: [id number] Date of Birth: October 06, 1975 Referring Provider (OT): Dr. Sydnee Cabal   Encounter Date: 08/05/2019  OT End of Session - 08/05/19 1236    Visit Number  8    Number of Visits  8    Date for OT Re-Evaluation  08/12/19    Authorization Type  [employer]    Authorization Time Period  1 eval and 12 visits approved    Authorization - Visit Number  8    Authorization - Number of Visits  13    Progress Note Due on Visit  10    OT Start Time  1119    OT Stop Time  1202    OT Time Calculation (min)  43 min    Activity Tolerance  Patient tolerated treatment well    Behavior During Therapy  [facility] for tasks assessed/performed       Past Medical History:  Diagnosis Date  . Back pain   . GERD (gastroesophageal reflux disease)   . Palpitations   . PONV (postoperative nausea and vomiting)   . Spinal headache     Past Surgical History:  Procedure Laterality Date  . CESAREAN SECTION    . Left knee arthroscopic surgery    . WISDOM TOOTH EXTRACTION      There were no vitals filed for this visit.  Subjective Assessment - 08/05/19 1119    Subjective   S: They're both hurting today, I think from trying to get fitted sheets on the bed.    Currently in Pain?  Yes    Pain Score  3     Pain Location  Shoulder    Pain Orientation  Left;Right    Pain Descriptors / Indicators  Aching;Sore    Pain Type  Acute pain    Pain Radiating Towards  neck-on left side    Pain Onset  In the past 7 days    Pain Frequency  Intermittent    Aggravating Factors   movement, use, making bed    Pain Relieving Factors  rest, heat    Multiple Pain Sites  No         [facility] OT Assessment - 08/05/19 1119      Assessment   Medical Diagnosis  bilateral shoulder pain       Precautions   Precautions  None               OT Treatments/Exercises (OP) - 08/05/19 1120      Exercises   Exercises  Shoulder;Neck      Shoulder Exercises: Standing   Protraction  Theraband;10 reps  (Pended)     Theraband Level (Shoulder Protraction)  Level 3 (Green)  (Pended)     Horizontal ABduction  Theraband;10 reps  (Pended)     Theraband Level (Shoulder Horizontal ABduction)  Level 3 (Green)  (Pended)     External Rotation  Theraband;10 reps  (Pended)     Theraband Level (Shoulder External Rotation)  Level 3 (Green)  (Pended)     Internal Rotation  Theraband;10 reps  (Pended)     Theraband Level (Shoulder Internal Rotation)  Level 3 (Green)  (Pended)     Flexion  Theraband;10 reps  (Pended)     Theraband Level (Shoulder Flexion)  Level 3 (Green)  (Pended)  ABduction  Theraband;10 reps  (Pended)     Theraband Level (Shoulder ABduction)  Level 3 (Green)  (Pended)       Shoulder Exercises: ROM/Strengthening   Other ROM/Strengthening Exercises  red loop band: lateral wall slides, wall slide with lift off, diagonal wall slides 10X      Shoulder Exercises: Stretch   Other Shoulder Stretches  pectoralis stretch LUE, 3X, 20" holds, lower and upper    Other Shoulder Stretches  doorway stretch, 3x20"      Manual Therapy   Manual Therapy  Myofascial release    Manual therapy comments  manual therapy completed prior to therapeutic exercises    Myofascial Release  myofascial release to left upper arm, anterior shoulder, trapezius, and scapular regions to decrease pain and fascial restrictions and increase joint ROM.             OT Education - 08/05/19 1143    Education Details  green theraband strengthening    Person(s) Educated  Patient    Methods  Explanation;Demonstration;Handout    Comprehension  Verbalized understanding;Returned demonstration       OT Short Term Goals - 07/15/19 1025      OT SHORT TERM GOAL #1   Title  A: Pt will be educated on and  independent in HEP to increase ability to perform ADLs without using compensatory strategies.    Time  4    Period  Weeks    Status  On-going    Target Date  08/12/19      OT SHORT TERM GOAL #2   Title  Pt will increase BUE A/ROM to [facility] to improve ability to donn bra and operate clasp.    Time  4    Period  Weeks    Status  On-going      OT SHORT TERM GOAL #3   Title  Pt will decrease fascial restrictions in LUE from mod to trace amounts or less to improve mobility required for overhead reaching.    Time  4    Period  Weeks    Status  On-going      OT SHORT TERM GOAL #4   Title  Pt will decrease pain to 2/10 or less to improve ability to sleep for 4 consecutive hours or more.    Time  4    Period  Weeks    Status  On-going      OT SHORT TERM GOAL #5   Title  Pt will increase strength to 4+/5 or greater to increase ability to perform typical daily work tasks.    Time  4    Period  Weeks    Status  On-going               Plan - 08/05/19 1236    Clinical Impression Statement  A: Pt reports soreness in BUE trapezius and left side of neck today. Continued with manual techniques, pt with trigger points in pectoralis and trapezius regions. Continued with stretches in standing, added green theraband strengthening in standing today. Verbal cuing for form and technique. Updated for HEP.    Body Structure / Function / Physical Skills  ADL;Endurance;UE functional use;Fascial restriction;Pain;ROM;IADL;Strength    Plan  P: Reassess, possible recertification. Follow up on HEP    OT Home Exercise Plan  3/30: shoulder stretches; 4/6: A/ROM shoulder; 4/22: green theraband strengthening    Consulted and Agree with Plan of Care  Patient       Patient will benefit from  skilled therapeutic intervention in order to improve the following deficits and impairments:   Body Structure / Function / Physical Skills: ADL, Endurance, UE functional use, Fascial restriction, Pain, ROM, IADL,  Strength       Visit Diagnosis: Acute pain of right shoulder  Acute pain of left shoulder  Other symptoms and signs involving the musculoskeletal system    Problem List Patient Active Problem List   Diagnosis Date Noted  . Palpitations 07/26/2011   Guadelupe Sabin, OTR/L  [phone] 08/05/2019, 12:38 PM  [place] [address] [place], [place], [address] Phone: [phone]   Fax:  [phone]  Name: [name] MRN: [id number] Date of Birth: 1976-03-21

## 2019-08-05 NOTE — Patient Instructions (Signed)

## 2019-08-09 DIAGNOSIS — F419 Anxiety disorder, unspecified: Secondary | ICD-10-CM | POA: Diagnosis not present

## 2019-08-10 ENCOUNTER — Encounter (HOSPITAL_COMMUNITY): Payer: Self-pay | Admitting: Occupational Therapy

## 2019-08-10 ENCOUNTER — Other Ambulatory Visit: Payer: Self-pay

## 2019-08-10 ENCOUNTER — Ambulatory Visit (HOSPITAL_COMMUNITY): Payer: PRIVATE HEALTH INSURANCE | Attending: Specialist | Admitting: Occupational Therapy

## 2019-08-10 DIAGNOSIS — M25512 Pain in left shoulder: Secondary | ICD-10-CM | POA: Diagnosis present

## 2019-08-10 DIAGNOSIS — M25511 Pain in right shoulder: Secondary | ICD-10-CM | POA: Diagnosis present

## 2019-08-10 DIAGNOSIS — R29898 Other symptoms and signs involving the musculoskeletal system: Secondary | ICD-10-CM | POA: Insufficient documentation

## 2019-08-10 NOTE — Therapy (Signed)
[facility] [place], [place], [address] Phone: [phone]   Fax:  [phone]  Occupational Therapy Reassessment, Treatment, Discharge Summary  Patient Details  Name: [name] MRN: [id number] Date of Birth: December 30, 1975 Referring Provider (OT): Dr. Sydnee Cabal  Progress Note Reporting Period 07/13/19 to 08/10/19  See note below for Objective Data and Assessment of Progress/Goals.       Encounter Date: 08/10/2019  OT End of Session - 08/10/19 1155    Visit Number  9    Number of Visits  9    Date for OT Re-Evaluation  08/12/19    Authorization Type  [employer]-Worker's [employer]    Authorization Time Period  1 eval and 12 visits approved    Authorization - Visit Number  9    Authorization - Number of Visits  13    Progress Note Due on Visit  10    OT Start Time  1119    OT Stop Time  1201    OT Time Calculation (min)  42 min    Activity Tolerance  Patient tolerated treatment well    Behavior During Therapy  WFL for tasks assessed/performed       Past Medical History:  Diagnosis Date  . Back pain   . GERD (gastroesophageal reflux disease)   . Palpitations   . PONV (postoperative nausea and vomiting)   . Spinal headache     Past Surgical History:  Procedure Laterality Date  . CESAREAN SECTION    . Left knee arthroscopic surgery    . WISDOM TOOTH EXTRACTION      There were no vitals filed for this visit.  Subjective Assessment - 08/10/19 1121    Subjective   S: It feels like a deep muscle ache.    Currently in Pain?  Yes    Pain Score  3     Pain Location  Shoulder    Pain Orientation  Right;Left    Pain Descriptors / Indicators  Aching;Sore    Pain Type  Acute pain    Pain Radiating Towards  N/A    Pain Onset  In the past 7 days    Pain Frequency  Intermittent    Aggravating Factors   movement, use, making bed    Pain Relieving Factors  rest, heat    Effect of Pain on Daily Activities  min  effect on ADLs    Multiple Pain Sites  No         [facility] OT Assessment - 08/10/19 1121      Assessment   Medical Diagnosis  bilateral shoulder pain      Precautions   Precautions  None      Palpation   Palpation comment  mod fascial restrictions in anterior shoulder and trapezius, min restrictions in pectoralis and scapular regions      AROM   Overall AROM Comments  Assessed seated, er/IR abducted    AROM Assessment Site  Shoulder    Right/Left Shoulder  Right;Left    Right Shoulder Flexion  180 Degrees   136 previous   Right Shoulder ABduction  180 Degrees   92 previous   Right Shoulder Internal Rotation  90 Degrees   same as previous   Right Shoulder External Rotation  65 Degrees   64 previous   Left Shoulder Flexion  180 Degrees   135 previous   Left Shoulder ABduction  180 Degrees   123 previous   Left  Shoulder Internal Rotation  90 Degrees   same as previous   Left Shoulder External Rotation  72 Degrees   same as previous     PROM   Overall PROM Comments  P/ROM is WNL      Strength   Overall Strength Comments  Assessed seated, er/IR adducted    Right Shoulder Flexion  4/5   4-/5 previous   Right Shoulder ABduction  4/5   4-/5 previous   Right Shoulder Internal Rotation  5/5   same as previous   Right Shoulder External Rotation  4/5   same as previous   Left Shoulder Flexion  4+/5   4/5 previous   Left Shoulder ABduction  4/5   same as previous   Left Shoulder Internal Rotation  5/5   same as previous   Left Shoulder External Rotation  4/5   same as previous              OT Treatments/Exercises (OP) - 08/10/19 1142      Exercises   Exercises  Shoulder      Shoulder Exercises: ROM/Strengthening   Wall Pushups  10 reps    Other ROM/Strengthening Exercises  arms on fire: 2' total, 15" each position, 5 positions    Other ROM/Strengthening Exercises  red loop band: lateral wall slides, wall slide with lift off, diagonal wall slides 12X       Shoulder Exercises: Body Blade   Flexion  5 reps   BUE, holding for 10" on last rep     Manual Therapy   Manual Therapy  Myofascial release    Manual therapy comments  manual therapy completed prior to therapeutic exercises    Myofascial Release  myofascial release to left upper arm, anterior shoulder, trapezius, and scapular regions to decrease pain and fascial restrictions and increase joint ROM.             OT Education - 08/10/19 1153    Education Details  red loop band scapular stability and strengthening    Person(s) Educated  Patient    Methods  Explanation;Demonstration;Handout    Comprehension  Verbalized understanding;Returned demonstration       OT Short Term Goals - 08/10/19 1148      OT SHORT TERM GOAL #1   Title  A: Pt will be educated on and independent in HEP to increase ability to perform ADLs without using compensatory strategies.    Time  4    Period  Weeks    Status  Achieved    Target Date  08/12/19      OT SHORT TERM GOAL #2   Title  Pt will increase BUE A/ROM to [facility] to improve ability to donn bra and operate clasp.    Time  4    Period  Weeks    Status  Achieved      OT SHORT TERM GOAL #3   Title  Pt will decrease fascial restrictions in LUE from mod to trace amounts or less to improve mobility required for overhead reaching.    Time  4    Period  Weeks    Status  Not Met      OT SHORT TERM GOAL #4   Title  Pt will decrease pain to 2/10 or less to improve ability to sleep for 4 consecutive hours or more.    Time  4    Period  Weeks    Status  Achieved      OT SHORT  TERM GOAL #5   Title  Pt will increase strength to 4+/5 or greater to increase ability to perform typical daily work tasks.    Time  4    Period  Weeks    Status  Not Met               Plan - 08/10/19 1149    Clinical Impression Statement  A: Reassessment completed this session, pt has met 3/5 goals and has improved functional use of BUE during ADL tasks. Pt  continues to have muscle soreness and fascial restrictions causing increased pain, and decreased strength limiting ability to perform work tasks. Pt notes tasks with arms adducted are ok, tasks with arms extended or abducted are very difficult. Discussed shoulder stability and strength deficits with pt in relation to ADL and work tasks. Session focusing on shoulder and scapular stability and strengthening today, updated HEP for red loop band exercises. Discussed referral to PT for dry needling to address deep muscle knots and fascial restrictions if appropriate, pt is in agreement.    Body Structure / Function / Physical Skills  ADL;Endurance;UE functional use;Fascial restriction;Pain;ROM;IADL;Strength    Plan  P: Refer to PT for dry needling with continued BUE strengthening. Discharge from [facility]  3/30: shoulder stretches; 4/6: A/ROM shoulder; 4/22: green theraband strengthening; 4/27: red loop band    Consulted and Agree with Plan of Care  Patient       Patient will benefit from skilled therapeutic intervention in order to improve the following deficits and impairments:   Body Structure / Function / Physical Skills: ADL, Endurance, UE functional use, Fascial restriction, Pain, ROM, IADL, Strength       Visit Diagnosis: Acute pain of right shoulder  Acute pain of left shoulder  Other symptoms and signs involving the musculoskeletal system    Problem List Patient Active Problem List   Diagnosis Date Noted  . Palpitations 07/26/2011   Guadelupe Sabin, OTR/L  [phone] 08/10/2019, 1:06 PM  [facility] [address] [place], [place], [address] Phone: [phone]   Fax:  [phone]  Name: TARAH BUBOLTZ MRN: [id number] Date of Birth: [birth year]   OCCUPATIONAL THERAPY DISCHARGE SUMMARY  Visits from Start of Care: 9  Current functional level related to goals / functional outcomes: See above. Pt with improvement in  ROM and pain, functional use during ADL tasks and improvement in sleep. Pt would potentially benefit from dry needling and referral will be sent to MD.    Remaining deficits: Increased fascial restrictions and muscle knots, decreased shoulder strength and stability of BUE   Education / Equipment: HEP for shoulder strengthening and stability Plan: Patient agrees to discharge.  Patient goals were partially met. Patient is being discharged due to meeting the stated rehab goals.  ?????

## 2019-08-10 NOTE — Patient Instructions (Signed)
Complete all exercises with BUE, 10-15X each.   1) Wall slide: Place an elastic band around your arms at the level of your wrists as shown. Next, place your forearms and hands along a wall so that your elbows are bent and your arms point towards the ceiling.  Then, protract your shoulder blades forward and then slide your arms up the wall as shown.   2) Lateral Wall Walks: With hands against wall, walk or slide your hands to the side against resistance of the band.   3) Upward/Diagonal Wall Walks: Walk or slide your hand up the wall in a diagonal direction, going against the resistance of the band.

## 2019-08-12 ENCOUNTER — Ambulatory Visit (HOSPITAL_COMMUNITY): Payer: PRIVATE HEALTH INSURANCE | Admitting: Occupational Therapy

## 2019-08-13 MED FILL — METFORMIN HCL 1000 MG TABS: 1000 | 90 days supply | Qty: 180 | Fill #1

## 2019-08-13 MED FILL — PANTOPRAZOLE SOD DR 40 MG T: 40 | 90 days supply | Qty: 90 | Fill #1

## 2019-08-23 ENCOUNTER — Encounter (HOSPITAL_COMMUNITY): Payer: Self-pay | Admitting: Physical Therapy

## 2019-08-23 ENCOUNTER — Ambulatory Visit (HOSPITAL_COMMUNITY): Payer: PRIVATE HEALTH INSURANCE | Attending: Specialist | Admitting: Physical Therapy

## 2019-08-23 ENCOUNTER — Other Ambulatory Visit: Payer: Self-pay

## 2019-08-23 DIAGNOSIS — M25511 Pain in right shoulder: Secondary | ICD-10-CM | POA: Insufficient documentation

## 2019-08-23 DIAGNOSIS — M25512 Pain in left shoulder: Secondary | ICD-10-CM | POA: Insufficient documentation

## 2019-08-23 DIAGNOSIS — F419 Anxiety disorder, unspecified: Secondary | ICD-10-CM | POA: Diagnosis not present

## 2019-08-23 NOTE — Patient Instructions (Signed)

## 2019-08-23 NOTE — Therapy (Signed)
[facility] [address] [place], [place], [address] Phone: [phone]   Fax:  [phone]  Physical Therapy Evaluation  Patient Details  Name: [name] MRN: [id number] Date of Birth: 1975-06-04 Referring Provider (PT): Sydnee Cabal   Encounter Date: 08/23/2019  PT End of Session - 08/23/19 1532    Visit Number  1    Number of Visits  12    Date for PT Re-Evaluation  10/04/19   eval 08/23/19   Authorization Type  worker's compensation    Progress Note Due on Visit  10    PT Start Time  1532    PT Stop Time  1616    PT Time Calculation (min)  44 min    Activity Tolerance  Patient tolerated treatment well;Patient limited by pain    Behavior During Therapy  [facility] for tasks assessed/performed       Past Medical History:  Diagnosis Date  . Back pain   . GERD (gastroesophageal reflux disease)   . Palpitations   . PONV (postoperative nausea and vomiting)   . Spinal headache     Past Surgical History:  Procedure Laterality Date  . CESAREAN SECTION    . Left knee arthroscopic surgery    . WISDOM TOOTH EXTRACTION      There were no vitals filed for this visit.   Subjective Assessment - 08/23/19 1721    Subjective  Complains of bilateral shoulder pain after a fall at work on 05/23/19. States she was in the locker room and there was water on the floor, she slipped forward and tried to catch herself with her arms outstretched in front of her. States she has tried OT and she can now move her arms better but is still having pain in both arms. States on the right it sometimes feels like it catches in her armpit and the pain tends to wrap around from the back and to the front. On the left side the pain is more along the clavicle and towards the upper trap. States heat and rest help and so does deep blue (cream like icy hot). States she is right handed but she carries a lot of things in her left arm. She works 12 hour shifts during the day  on the weekends.    Pertinent History  DB    Limitations  Lifting;House hold activities    Patient Stated Goals  to be able to return to regular duty at work    Currently in Pain?  Yes    Pain Score  4     Pain Location  Shoulder    Pain Orientation  Right;Left    Pain Descriptors / Indicators  Aching;Tightness;Sore    Pain Type  Acute pain    Pain Onset  More than a month ago    Pain Frequency  Constant    Aggravating Factors   movement, using arms    Pain Relieving Factors  rest, heat         [facility] PT Assessment - 08/23/19 0001      Assessment   Medical Diagnosis  bilateral shoulder pain    Referring Provider (PT)  Sydnee Cabal    Hand Dominance  Right      Precautions   Precautions  None      Balance Screen   Has the patient fallen in the past 6 months  No      [place] residence  Available Help at Discharge  Family    Type of Home  House      Cognition   Overall Cognitive Status  Within Functional Limits for tasks assessed      Observation/Other Assessments   Focus on Therapeutic Outcomes (FOTO)   41% impaired      AROM   AROM Assessment Site  Shoulder    Right Shoulder Flexion  180 Degrees    Right Shoulder ABduction  180 Degrees    Right Shoulder Internal Rotation  90 Degrees   at 90 degrees of abd   Right Shoulder External Rotation  65 Degrees   at 90 degrees of abd   Left Shoulder Flexion  180 Degrees    Left Shoulder ABduction  180 Degrees    Left Shoulder Internal Rotation  90 Degrees   at 90 degrees of abd   Left Shoulder External Rotation  65 Degrees   at 90 degrees of abd     Palpation   Spinal mobility  hypomobility noted in thoracic spine and ribs (bilateral)- reproduced symptoms in shoulder on left    Palpation comment  tenderness to palpation in Bilateral - UT, rhomboids, infraspinatus, teres minor, subscap (R>L), pecs                Objective measurements completed on examination: See  above findings.      [facility] Adult PT Treatment/Exercise - 08/23/19 0001      Shoulder Exercises: Supine   Other Supine Exercises  towel roll down spine - 8 minutes - tolerated well       Manual Therapy   Manual Therapy  Joint mobilization;Soft tissue mobilization    Manual therapy comments  manual therapy completed prior to therapeutic exercises    Joint Mobilization  UPA to ribs 2-5 Bilaterally - grade II/III tolerated well; CPA to t2-6 grade II/III - tolerated well     Soft tissue mobilization  STM to rhomboids and thoracic paraspinals.              PT Education - 08/23/19 1709    Education Details  Educated patient on dry needling, risks and benefits and answered all questions about it. Educated patient on current presentation and different interventions to help with symptoms and mobility. Educated patient on deep breathing techniques and how/why they would be helpful.    Person(s) Educated  Patient    Methods  Explanation    Comprehension  Verbalized understanding       PT Short Term Goals - 08/23/19 1726      PT SHORT TERM GOAL #1   Title  Patient will report at least 50% improvement in overall symptoms and/or functional ability.    Time  3    Period  Weeks    Status  New    Target Date  09/13/19      PT SHORT TERM GOAL #2   Title  Patient will be independent in self management strategies to improve quality of life and functional outcomes.    Time  3    Period  Weeks    Status  New    Target Date  09/13/19        PT Long Term Goals - 08/23/19 1726      PT LONG TERM GOAL #1   Title  Patient will report at least 75% improvement in overall symptoms and/or functional ability.    Time  6    Period  Weeks    Status  New  Target Date  10/04/19      PT LONG TERM GOAL #2   Title  Patient will score with <35% impairment on FOTO to demonstrate improved functional mobility    Time  6    Period  Weeks    Status  New    Target Date  10/04/19              Plan - 08/23/19 1713    Clinical Impression Statement  Patient presents to therapy with bilateral shoulder pain that started after a fall at work on 05/23/19. Patient has been through a round of occupational therapy and that has helped with her motion and strength, but she continues to have pain in her shoulders that prevents her from performing work required tasks and limits her daily tasks at home. Patient presents with hypomobility throughout thoracic spine and ribs with muscular tension in adjacent ribs. Able to decrease symptoms with thoracic mobilization and stretch. Patient would greatly benefit from skilled physical therapy to improve functional mobility and use of bilateral upper extremities.    Personal Factors and Comorbidities  Comorbidity 1;Comorbidity 2    Comorbidities  DB    Examination-Activity Limitations  Carry;Lift;Hygiene/Grooming    Examination-Participation Restrictions  Cleaning;Other   work   Stability/Clinical Decision Making  Stable/Uncomplicated    Clinical Decision Making  Low    Rehab Potential  Good    PT Frequency  2x / week    PT Duration  6 weeks    PT Treatment/Interventions  ADLs/Self Care Home Management;Aquatic Therapy;Biofeedback;Cryotherapy;Electrical Stimulation;Moist Heat;Traction;Balance training;Therapeutic exercise;Therapeutic activities;Functional mobility training;Stair training;Gait training;DME Instruction;Neuromuscular re-education;Cognitive remediation;Patient/family education;Manual techniques;Dry needling;Passive range of motion;Joint Manipulations;Spinal Manipulations    PT Next Visit Plan  thoracic and rib mobilizations (UPA and medial glides for ribs). DN as needed    PT Home Exercise Plan  5/10 towel roll down spine, deep breathing       Patient will benefit from skilled therapeutic intervention in order to improve the following deficits and impairments:  Pain, Impaired UE functional use, Decreased activity tolerance, Impaired  flexibility, Hypomobility  Visit Diagnosis: Acute pain of right shoulder  Acute pain of left shoulder     Problem List Patient Active Problem List   Diagnosis Date Noted  . Palpitations 07/26/2011   5:30 PM, 08/23/19 Jerene Pitch, DPT Physical Therapy with [facility]  [phone] office   [facility] [address] [place], [place], [address] Phone: [phone]   Fax:  [phone]  Name: [name] MRN: [id number] Date of Birth: 02-03-1976

## 2019-08-24 ENCOUNTER — Encounter (HOSPITAL_COMMUNITY): Payer: Self-pay | Admitting: Physical Therapy

## 2019-08-24 ENCOUNTER — Ambulatory Visit (HOSPITAL_COMMUNITY): Payer: PRIVATE HEALTH INSURANCE | Attending: Specialist | Admitting: Physical Therapy

## 2019-08-24 DIAGNOSIS — M25511 Pain in right shoulder: Secondary | ICD-10-CM | POA: Diagnosis present

## 2019-08-24 DIAGNOSIS — M25512 Pain in left shoulder: Secondary | ICD-10-CM

## 2019-08-24 DIAGNOSIS — R29898 Other symptoms and signs involving the musculoskeletal system: Secondary | ICD-10-CM

## 2019-08-24 DIAGNOSIS — R102 Pelvic and perineal pain: Secondary | ICD-10-CM | POA: Diagnosis not present

## 2019-08-24 NOTE — Therapy (Signed)
[facility] [address] [place], [place], [address] Phone: [phone]   Fax:  [phone]  Physical Therapy Treatment  Patient Details  Name: [name] MRN: [id number] Date of Birth: Sep 13, 1975 Referring Provider (PT): Sydnee Cabal   Encounter Date: 08/24/2019  PT End of Session - 08/24/19 0836    Visit Number  2    Number of Visits  12    Date for PT Re-Evaluation  10/04/19   eval 08/23/19   Authorization Type  worker's compensation    Progress Note Due on Visit  10    PT Start Time  [phone]    PT Stop Time  0915    PT Time Calculation (min)  39 min    Activity Tolerance  Patient tolerated treatment well;Patient limited by pain    Behavior During Therapy  [facility] for tasks assessed/performed       Past Medical History:  Diagnosis Date  . Back pain   . GERD (gastroesophageal reflux disease)   . Palpitations   . PONV (postoperative nausea and vomiting)   . Spinal headache     Past Surgical History:  Procedure Laterality Date  . CESAREAN SECTION    . Left knee arthroscopic surgery    . WISDOM TOOTH EXTRACTION      There were no vitals filed for this visit.  Subjective Assessment - 08/24/19 0852    Subjective  Reports she is feeling better. It feels like her tension is "melting" away. She is still having pain in  both shoulders but it is not as bad. States she did her towel roll exercise at home and was able to do it without difficulties.    Pertinent History  DB    Limitations  Lifting;House hold activities    Patient Stated Goals  to be able to return to regular duty at work    Pain Onset  More than a month ago         [facility] PT Assessment - 08/24/19 0001      Assessment   Medical Diagnosis  bilateral shoulder pain    Referring Provider (PT)  Sydnee Cabal    Onset Date/Surgical Date  05/23/19    Hand Dominance  Right                   [facility] Adult PT Treatment/Exercise - 08/24/19 0001      Neck  Exercises: Supine   Other Supine Exercise  self mobilization to ribs and muscles - PT hand over hand assist.       Manual Therapy   Manual Therapy  Joint mobilization;Soft tissue mobilization    Manual therapy comments  manual therapy completed prior to therapeutic exercises    Joint Mobilization  medial rib mobilizations grade II/III right ribs 3-9; AP ribs B ribs 2-5  grade II/II tolerated well    Soft tissue mobilization  STM to bilateral subclavius, pecs, right subscap, right triceps and lat - tolerated well - referred pain down arm.              PT Education - 08/24/19 1010    Education Details  educated patient in self mobilization of muscles and joints to decrease pain and improve tissue extensibility. hand over hand assist. educated patietn in anatomy and how ribs/muscles can refer pain to shoulders.    Person(s) Educated  Patient    Methods  Explanation    Comprehension  Verbalized understanding  PT Short Term Goals - 08/23/19 1726      PT SHORT TERM GOAL #1   Title  Patient will report at least 50% improvement in overall symptoms and/or functional ability.    Time  3    Period  Weeks    Status  New    Target Date  09/13/19      PT SHORT TERM GOAL #2   Title  Patient will be independent in self management strategies to improve quality of life and functional outcomes.    Time  3    Period  Weeks    Status  New    Target Date  09/13/19        PT Long Term Goals - 08/23/19 1726      PT LONG TERM GOAL #1   Title  Patient will report at least 75% improvement in overall symptoms and/or functional ability.    Time  6    Period  Weeks    Status  New    Target Date  10/04/19      PT LONG TERM GOAL #2   Title  Patient will score with <35% impairment on FOTO to demonstrate improved functional mobility    Time  6    Period  Weeks    Status  New    Target Date  10/04/19            Plan - 08/24/19 0851    Clinical Impression Statement  Focused on  joint and soft tissue mobilization. This was tolerated very well. Less tension noted end of session but continued pain. Improved cervical and thoracic mobility as well as shoulder mobility post session. Discussed needling on this date but decided to focus on manual interventions secondary to response to last session's treatment. Educated patient in gentle self mobilization techniques to perform at home and practiced in clinic.    Personal Factors and Comorbidities  Comorbidity 1;Comorbidity 2    Comorbidities  DB    Examination-Activity Limitations  Carry;Lift;Hygiene/Grooming    Examination-Participation Restrictions  Cleaning;Other   work   Stability/Clinical Decision Making  Stable/Uncomplicated    Rehab Potential  Good    PT Frequency  2x / week    PT Duration  6 weeks    PT Treatment/Interventions  ADLs/Self Care Home Management;Aquatic Therapy;Biofeedback;Cryotherapy;Electrical Stimulation;Moist Heat;Traction;Balance training;Therapeutic exercise;Therapeutic activities;Functional mobility training;Stair training;Gait training;DME Instruction;Neuromuscular re-education;Cognitive remediation;Patient/family education;Manual techniques;Dry needling;Passive range of motion;Joint Manipulations;Spinal Manipulations    PT Next Visit Plan  thoracic and rib mobilizations (UPA and medial glides for ribs). DN as needed    PT Home Exercise Plan  5/10 towel roll down spine, deep breathing; 5/11 self mobilization to ribs and muscles (focused anteriorly for easy access)       Patient will benefit from skilled therapeutic intervention in order to improve the following deficits and impairments:  Pain, Impaired UE functional use, Decreased activity tolerance, Impaired flexibility, Hypomobility  Visit Diagnosis: Acute pain of right shoulder  Acute pain of left shoulder  Other symptoms and signs involving the musculoskeletal system     Problem List Patient Active Problem List   Diagnosis Date Noted   . Palpitations 07/26/2011   11:01 AM, 08/24/19 Jerene Pitch, DPT Physical Therapy with [facility]  [phone] office  [facility] [address] [place], [place], [address] Phone: [phone]   Fax:  [phone]  Name: [name] MRN: [id number] Date of Birth: 03-31-1976

## 2019-08-31 ENCOUNTER — Ambulatory Visit (HOSPITAL_COMMUNITY): Payer: PRIVATE HEALTH INSURANCE | Attending: Specialist | Admitting: Physical Therapy

## 2019-08-31 ENCOUNTER — Encounter (HOSPITAL_COMMUNITY): Payer: Self-pay | Admitting: Physical Therapy

## 2019-08-31 ENCOUNTER — Other Ambulatory Visit: Payer: Self-pay

## 2019-08-31 DIAGNOSIS — Z1231 Encounter for screening mammogram for malignant neoplasm of breast: Secondary | ICD-10-CM | POA: Diagnosis not present

## 2019-08-31 DIAGNOSIS — R29898 Other symptoms and signs involving the musculoskeletal system: Secondary | ICD-10-CM | POA: Insufficient documentation

## 2019-08-31 DIAGNOSIS — Z130 Encounter for screening for diseases of the blood and blood-forming organs and certain disorders involving the immune mechanism: Secondary | ICD-10-CM | POA: Diagnosis not present

## 2019-08-31 DIAGNOSIS — M25512 Pain in left shoulder: Secondary | ICD-10-CM | POA: Diagnosis present

## 2019-08-31 DIAGNOSIS — M25511 Pain in right shoulder: Secondary | ICD-10-CM | POA: Insufficient documentation

## 2019-08-31 DIAGNOSIS — F419 Anxiety disorder, unspecified: Secondary | ICD-10-CM | POA: Diagnosis not present

## 2019-08-31 DIAGNOSIS — Z1389 Encounter for screening for other disorder: Secondary | ICD-10-CM | POA: Diagnosis not present

## 2019-08-31 DIAGNOSIS — Z01419 Encounter for gynecological examination (general) (routine) without abnormal findings: Secondary | ICD-10-CM | POA: Diagnosis not present

## 2019-08-31 DIAGNOSIS — Z6841 Body Mass Index (BMI) 40.0 and over, adult: Secondary | ICD-10-CM | POA: Diagnosis not present

## 2019-08-31 NOTE — Therapy (Signed)
[facility] [address] [place], [place], [address] Phone: [phone]   Fax:  [phone]  Physical Therapy Treatment  Patient Details  Name: [name] MRN: [id number] Date of Birth: [birth year] Referring Provider (PT): Sydnee Cabal   Encounter Date: 08/31/2019  PT End of Session - 08/31/19 1440    Visit Number  3    Number of Visits  12    Date for PT Re-Evaluation  10/04/19   eval 08/23/19   Authorization Type  worker's compensation    Progress Note Due on Visit  10    PT Start Time  1445    PT Stop Time  1525    PT Time Calculation (min)  40 min    Activity Tolerance  Patient tolerated treatment well;Patient limited by pain    Behavior During Therapy  [facility] for tasks assessed/performed       Past Medical History:  Diagnosis Date  . Back pain   . GERD (gastroesophageal reflux disease)   . Palpitations   . PONV (postoperative nausea and vomiting)   . Spinal headache     Past Surgical History:  Procedure Laterality Date  . CESAREAN SECTION    . Left knee arthroscopic surgery    . WISDOM TOOTH EXTRACTION      There were no vitals filed for this visit.  Subjective Assessment - 08/31/19 1644    Subjective  Patient reports she worked this weekend and she was sore and she tried to stretching some but things tightened up. States that she know has a band like pain around her left upper arm and right shoulder region. States yesterday she had a catch but it was the first catch since last session    Pertinent History  DB    Limitations  Lifting;House hold activities    Patient Stated Goals  to be able to return to regular duty at work    Currently in Pain?  Yes    Pain Score  5     Pain Location  Shoulder    Pain Orientation  Right;Left    Pain Descriptors / Indicators  Aching;Tender;Tightness    Pain Type  Acute pain    Pain Onset  More than a month ago         [facility] PT Assessment - 08/31/19 0001      Assessment    Medical Diagnosis  bilateral shoulder pain    Referring Provider (PT)  Sydnee Cabal    Onset Date/Surgical Date  05/23/19    Hand Dominance  Right                    [facility] Adult PT Treatment/Exercise - 08/31/19 0001      Manual Therapy   Manual Therapy  Joint mobilization;Soft tissue mobilization    Manual therapy comments  manual therapy completed prior to therapeutic exercises    Joint Mobilization  right AP GHJ grade II tolerated well    Soft tissue mobilization  STM to B infraspinatus, lats, deltoid tolerated well        Trigger Point Dry Needling - 08/31/19 0001    Consent Given?  Yes    Education Handout Provided  Previously provided    Muscles Treated Upper Quadrant  Deltoid;Biceps    Dry Needling Comments  2 DN in left deltoid and 2 needles in left bicep - tolerated well     Deltoid Response  Palpable increased muscle length  Biceps Response  Palpable increased muscle length           PT Education - 08/31/19 1650    Education Details  Patient educated in post needling symptoms and what to do if she gets sore. - heat, no NSAIDs, Massage    Person(s) Educated  Patient    Methods  Explanation    Comprehension  Verbalized understanding       PT Short Term Goals - 08/23/19 1726      PT SHORT TERM GOAL #1   Title  Patient will report at least 50% improvement in overall symptoms and/or functional ability.    Time  3    Period  Weeks    Status  New    Target Date  09/13/19      PT SHORT TERM GOAL #2   Title  Patient will be independent in self management strategies to improve quality of life and functional outcomes.    Time  3    Period  Weeks    Status  New    Target Date  09/13/19        PT Long Term Goals - 08/23/19 1726      PT LONG TERM GOAL #1   Title  Patient will report at least 75% improvement in overall symptoms and/or functional ability.    Time  6    Period  Weeks    Status  New    Target Date  10/04/19      PT LONG  TERM GOAL #2   Title  Patient will score with <35% impairment on FOTO to demonstrate improved functional mobility    Time  6    Period  Weeks    Status  New    Target Date  10/04/19            Plan - 08/31/19 1645    Clinical Impression Statement  Added dry needling to POC today. Patient tolerated very well. Reports of coldness noted after needling in left UE like arm was "waking up" and no pain noted end of session but fatigue. Educated patient on typical post op needling sensation. Will follow up with post needling symptoms tomorrow.    Personal Factors and Comorbidities  Comorbidity 1;Comorbidity 2    Comorbidities  DB    Examination-Activity Limitations  Carry;Lift;Hygiene/Grooming    Examination-Participation Restrictions  Cleaning;Other   work   Stability/Clinical Decision Making  Stable/Uncomplicated    Rehab Potential  Good    PT Frequency  2x / week    PT Duration  6 weeks    PT Treatment/Interventions  ADLs/Self Care Home Management;Aquatic Therapy;Biofeedback;Cryotherapy;Electrical Stimulation;Moist Heat;Traction;Balance training;Therapeutic exercise;Therapeutic activities;Functional mobility training;Stair training;Gait training;DME Instruction;Neuromuscular re-education;Cognitive remediation;Patient/family education;Manual techniques;Dry needling;Passive range of motion;Joint Manipulations;Spinal Manipulations    PT Next Visit Plan  thoracic and rib mobilizations (UPA and medial glides for ribs). DN as needed    PT Home Exercise Plan  5/10 towel roll down spine, deep breathing; 5/11 self mobilization to ribs and muscles (focused anteriorly for easy access)       Patient will benefit from skilled therapeutic intervention in order to improve the following deficits and impairments:  Pain, Impaired UE functional use, Decreased activity tolerance, Impaired flexibility, Hypomobility  Visit Diagnosis: Acute pain of right shoulder  Acute pain of left shoulder  Other  symptoms and signs involving the musculoskeletal system     Problem List Patient Active Problem List   Diagnosis Date Noted  . Palpitations 07/26/2011  5:04 PM, 08/31/19 Jerene Pitch, DPT Physical Therapy with [facility]  [phone] office  [facility] [address] [place], [place], [address] Phone: [phone]   Fax:  [phone]  Name: [name] MRN: [id number] Date of Birth: June 03, 1975

## 2019-09-01 ENCOUNTER — Encounter (HOSPITAL_COMMUNITY): Payer: Self-pay | Admitting: Physical Therapy

## 2019-09-01 ENCOUNTER — Ambulatory Visit (HOSPITAL_COMMUNITY): Payer: PRIVATE HEALTH INSURANCE | Attending: Specialist | Admitting: Physical Therapy

## 2019-09-01 DIAGNOSIS — R29898 Other symptoms and signs involving the musculoskeletal system: Secondary | ICD-10-CM | POA: Diagnosis present

## 2019-09-01 DIAGNOSIS — M25511 Pain in right shoulder: Secondary | ICD-10-CM | POA: Diagnosis not present

## 2019-09-01 DIAGNOSIS — M25512 Pain in left shoulder: Secondary | ICD-10-CM | POA: Insufficient documentation

## 2019-09-01 NOTE — Therapy (Signed)
[facility] [address] [place], [place], [address] Phone: [phone]   Fax:  [phone]  Physical Therapy Treatment  Patient Details  Name: [name] MRN: [id number] Date of Birth: 1975-06-09 Referring Provider (PT): Sydnee Cabal   Encounter Date: 09/01/2019  PT End of Session - 09/01/19 1449    Visit Number  4    Number of Visits  12    Date for PT Re-Evaluation  10/04/19   eval 08/23/19   Authorization Type  worker's compensation    Progress Note Due on Visit  10    PT Start Time  1449    PT Stop Time  1538    PT Time Calculation (min)  49 min    Activity Tolerance  Patient tolerated treatment well;Patient limited by pain    Behavior During Therapy  [facility] for tasks assessed/performed       Past Medical History:  Diagnosis Date  . Back pain   . GERD (gastroesophageal reflux disease)   . Palpitations   . PONV (postoperative nausea and vomiting)   . Spinal headache     Past Surgical History:  Procedure Laterality Date  . CESAREAN SECTION    . Left knee arthroscopic surgery    . WISDOM TOOTH EXTRACTION      There were no vitals filed for this visit.  Subjective Assessment - 09/01/19 1453    Subjective  States after last session was very tired and she took a nap. Band like pain on left arm almost resolved still achy in her arm and shoulder. Right arm is still tight up in shoulder.    Pertinent History  DB    Limitations  Lifting;House hold activities    Patient Stated Goals  to be able to return to regular duty at work    Currently in Pain?  Yes    Pain Score  2     Pain Location  Shoulder    Pain Orientation  Right;Left    Pain Descriptors / Indicators  Aching    Pain Onset  More than a month ago         [facility] PT Assessment - 09/01/19 0001      Assessment   Medical Diagnosis  bilateral shoulder pain    Referring Provider (PT)  Sydnee Cabal    Onset Date/Surgical Date  05/23/19    Hand Dominance   Right                    [facility] Adult PT Treatment/Exercise - 09/01/19 0001      Shoulder Exercises: Seated   Other Seated Exercises  self mobilization to bottom of foot with tennis ball - 5 minutes B - practiced foot mobility exercises Inv/Ev/supinat/pronation/DF/PF - active and passive bilaterally - prior demo performed      Manual Therapy   Manual Therapy  Joint mobilization;Soft tissue mobilization;Manual Traction    Manual therapy comments  manual therapy performed independently of other interventions    Joint Mobilization  CPA to C7- T 12 grade II/III - tolerated well; UPA to C7-T2 grade II/III.     Soft tissue mobilization  STM to bilateral rhomboids, triceps, biceps and lats focus on R today. STM to suboccipitals pt prone    Manual Traction  gentle traction to cervical spine with over pressure at upper thoracic - x6, 30-60 seconds holds       Trigger Point Dry Needling - 09/01/19 0001    Consent  Given?  Yes    Education Handout Provided  Previously provided    Muscles Treated Upper Quadrant  Triceps;Deltoid    Dry Needling Comments  2 needles in right deltoid and right riceps - twisting and needles left in situ for 10 minutes total  - pt prone    Deltoid Response  Palpable increased muscle length    Triceps Response  Twitch response elicited;Palpable increased muscle length           PT Education - 09/01/19 1611    Education Details  on differences between acupuncture and DN. On how different points can help with different symptoms and refer pain elsewhere. Educated patient on how foot mobility can refer to shoulder and chronic symptoms.    Person(s) Educated  Patient    Methods  Explanation    Comprehension  Verbalized understanding       PT Short Term Goals - 08/23/19 1726      PT SHORT TERM GOAL #1   Title  Patient will report at least 50% improvement in overall symptoms and/or functional ability.    Time  3    Period  Weeks    Status  New     Target Date  09/13/19      PT SHORT TERM GOAL #2   Title  Patient will be independent in self management strategies to improve quality of life and functional outcomes.    Time  3    Period  Weeks    Status  New    Target Date  09/13/19        PT Long Term Goals - 08/23/19 1726      PT LONG TERM GOAL #1   Title  Patient will report at least 75% improvement in overall symptoms and/or functional ability.    Time  6    Period  Weeks    Status  New    Target Date  10/04/19      PT LONG TERM GOAL #2   Title  Patient will score with <35% impairment on FOTO to demonstrate improved functional mobility    Time  6    Period  Weeks    Status  New    Target Date  10/04/19            Plan - 09/01/19 1610    Clinical Impression Statement  Focused on manual mobilization on this date tolerated this very well. Less pain noted afterwards. Fatigue noted after needling and patient reporting of tension "melting away." Educated patient in differences between acupuncture and dry needling as well as referred pain. Will continue to focus on joint and soft tissue mobilization a long with dry needling secondary to tolerance and response to initial treatments.    Personal Factors and Comorbidities  Comorbidity 1;Comorbidity 2    Comorbidities  DB    Examination-Activity Limitations  Carry;Lift;Hygiene/Grooming    Examination-Participation Restrictions  Cleaning;Other   work   Stability/Clinical Decision Making  Stable/Uncomplicated    Rehab Potential  Good    PT Frequency  2x / week    PT Duration  6 weeks    PT Treatment/Interventions  ADLs/Self Care Home Management;Aquatic Therapy;Biofeedback;Cryotherapy;Electrical Stimulation;Moist Heat;Traction;Balance training;Therapeutic exercise;Therapeutic activities;Functional mobility training;Stair training;Gait training;DME Instruction;Neuromuscular re-education;Cognitive remediation;Patient/family education;Manual techniques;Dry needling;Passive range  of motion;Joint Manipulations;Spinal Manipulations    PT Next Visit Plan  thoracic and rib mobilizations (UPA and medial glides for ribs). DN as needed    PT Home Exercise Plan  5/10 towel roll down  spine, deep breathing; 5/11 self mobilization to ribs and muscles (focused anteriorly for easy access); 5/19 foot mobility and self mobilization exercise    Consulted and Agree with Plan of Care  Patient       Patient will benefit from skilled therapeutic intervention in order to improve the following deficits and impairments:  Pain, Impaired UE functional use, Decreased activity tolerance, Impaired flexibility, Hypomobility  Visit Diagnosis: Acute pain of right shoulder  Acute pain of left shoulder  Other symptoms and signs involving the musculoskeletal system     Problem List Patient Active Problem List   Diagnosis Date Noted  . Palpitations 07/26/2011    4:16 PM, 09/01/19 Jerene Pitch, DPT Physical Therapy with [facility]  [phone] office  [facility] [address] [place], [place], [address] Phone: [phone]   Fax:  [phone]  Name: [name] MRN: [id number] Date of Birth: 1975-12-25

## 2019-09-03 MED FILL — FLUOXETINE HCL 10 MG TABS: 10 | 30 days supply | Qty: 30 | Fill #0

## 2019-09-06 DIAGNOSIS — F419 Anxiety disorder, unspecified: Secondary | ICD-10-CM | POA: Diagnosis not present

## 2019-09-07 ENCOUNTER — Ambulatory Visit (HOSPITAL_COMMUNITY): Payer: PRIVATE HEALTH INSURANCE | Admitting: Physical Therapy

## 2019-09-07 ENCOUNTER — Encounter (HOSPITAL_COMMUNITY): Payer: Self-pay | Admitting: Physical Therapy

## 2019-09-07 ENCOUNTER — Other Ambulatory Visit: Payer: Self-pay

## 2019-09-07 DIAGNOSIS — M25511 Pain in right shoulder: Secondary | ICD-10-CM

## 2019-09-07 DIAGNOSIS — R29898 Other symptoms and signs involving the musculoskeletal system: Secondary | ICD-10-CM

## 2019-09-07 DIAGNOSIS — M25512 Pain in left shoulder: Secondary | ICD-10-CM

## 2019-09-07 NOTE — Therapy (Signed)
[facility] [place], [place], [address] Phone: [phone]   Fax:  [phone]  Physical Therapy Treatment  Patient Details  Name: [name] MRN: [id number] Date of Birth: 07-Aug-1975 Referring Provider (PT): Sydnee Cabal   Encounter Date: 09/07/2019  PT End of Session - 09/07/19 1439    Visit Number  5    Number of Visits  12    Date for PT Re-Evaluation  10/04/19   eval 08/23/19   Authorization Type  worker's compensation, 8 additional visits approved on top of original 12 (4 left over from OT)    Authorization - Visit Number  5    Authorization - Number of Visits  12    Progress Note Due on Visit  10    PT Start Time  [id number]    PT Stop Time  1530    PT Time Calculation (min)  45 min    Activity Tolerance  Patient tolerated treatment well;Patient limited by pain    Behavior During Therapy  [facility] for tasks assessed/performed       Past Medical History:  Diagnosis Date  . Back pain   . GERD (gastroesophageal reflux disease)   . Palpitations   . PONV (postoperative nausea and vomiting)   . Spinal headache     Past Surgical History:  Procedure Laterality Date  . CESAREAN SECTION    . Left knee arthroscopic surgery    . WISDOM TOOTH EXTRACTION      There were no vitals filed for this visit.  Subjective Assessment - 09/07/19 1453    Subjective  States she hasn't been sleeping well because her kids have bene testing/having exams. States after last session she felt pretty good. States this feeling lasted through Sunday. States her pain has been achey and prior to PT it was sharp and achey. Yesterday she was reaching behind with right arm and it popped which was initially painful but now feels better. States her band like pain is only there when she presses on her arms and is no longer constant.    Pertinent History  DB    Limitations  Lifting;House hold activities    Patient Stated Goals  to be able to return  to regular duty at work    Currently in Pain?  Yes    Pain Score  2     Pain Location  Shoulder    Pain Orientation  Right;Left    Pain Onset  More than a month ago         [facility] PT Assessment - 09/07/19 0001      Assessment   Medical Diagnosis  bilateral shoulder pain    Referring Provider (PT)  Sydnee Cabal    Onset Date/Surgical Date  05/23/19                    [facility] Adult PT Treatment/Exercise - 09/07/19 0001      Neck Exercises: Supine   Other Supine Exercise  TRA activation - upper - approximate upper ribs- PT tactile and verbal cues - cramping initially with this - this eased up/stopped after a few reps. lower activation - focus on drawing in and pulling in stomach/using stomach muscles as if emptying all the air out.     Other Supine Exercise  1/2 foam down spine - 5 minutes total        Shoulder Exercises: Supine   Other Supine Exercises  diaphragmatic breathing -  practiced with verbal and tactile cues- 5 minutes - then performed intermittently when chest breathing caused discomfort.     Other Supine Exercises  chest breathing - focus on rib expansion - practiced with verbal and tactile cues and physical/active movement of rib movement             PT Education - 09/07/19 1545    Education Details  educated patient on importance of breathing, different ways to breath, rib mobility, how breathing can help down regulate central nervous system, ease tension and improve sleep.    Person(s) Educated  Patient    Methods  Explanation    Comprehension  Verbalized understanding       PT Short Term Goals - 08/23/19 1726      PT SHORT TERM GOAL #1   Title  Patient will report at least 50% improvement in overall symptoms and/or functional ability.    Time  3    Period  Weeks    Status  New    Target Date  09/13/19      PT SHORT TERM GOAL #2   Title  Patient will be independent in self management strategies to improve quality of life and functional  outcomes.    Time  3    Period  Weeks    Status  New    Target Date  09/13/19        PT Long Term Goals - 08/23/19 1726      PT LONG TERM GOAL #1   Title  Patient will report at least 75% improvement in overall symptoms and/or functional ability.    Time  6    Period  Weeks    Status  New    Target Date  10/04/19      PT LONG TERM GOAL #2   Title  Patient will score with <35% impairment on FOTO to demonstrate improved functional mobility    Time  6    Period  Weeks    Status  New    Target Date  10/04/19            Plan - 09/07/19 1550    Clinical Impression Statement  Focus today was on breathing and education. Significant improvement in rib mobility and tolerance to deep breathing exercises with practice. Initially cramping noted along right scapula, this was gone by end of session. All achy pain resolved except for mild discomfort over the front of the left shoulder. Patient reported she "felt like she just had a full body massage" by end of session reporting tension had eased. Will follow up with breath work and core activation next session.    Personal Factors and Comorbidities  Comorbidity 1;Comorbidity 2    Comorbidities  DB    Examination-Activity Limitations  Carry;Lift;Hygiene/Grooming    Examination-Participation Restrictions  Cleaning;Other   work   Stability/Clinical Decision Making  Stable/Uncomplicated    Rehab Potential  Good    PT Frequency  2x / week    PT Duration  6 weeks    PT Treatment/Interventions  ADLs/Self Care Home Management;Aquatic Therapy;Biofeedback;Cryotherapy;Electrical Stimulation;Moist Heat;Traction;Balance training;Therapeutic exercise;Therapeutic activities;Functional mobility training;Stair training;Gait training;DME Instruction;Neuromuscular re-education;Cognitive remediation;Patient/family education;Manual techniques;Dry needling;Passive range of motion;Joint Manipulations;Spinal Manipulations    PT Next Visit Plan  f/u with breath  work and TRA activation (upper and lower), s/l rib expansion. lat lengthing, thoracic and rib mobilizations (UPA and medial glides for ribs). DN as needed    PT Home Exercise Plan  5/10 towel roll down spine,  deep breathing; 5/11 self mobilization to ribs and muscles (focused anteriorly for easy access); 5/19 foot mobility and self mobilization exercise    Consulted and Agree with Plan of Care  Patient       Patient will benefit from skilled therapeutic intervention in order to improve the following deficits and impairments:  Pain, Impaired UE functional use, Decreased activity tolerance, Impaired flexibility, Hypomobility  Visit Diagnosis: Acute pain of right shoulder  Acute pain of left shoulder  Other symptoms and signs involving the musculoskeletal system     Problem List Patient Active Problem List   Diagnosis Date Noted  . Palpitations 07/26/2011   3:52 PM, 09/07/19 Jerene Pitch, DPT Physical Therapy with [facility]  [phone] office  [place] [address] [place], [place], [address] Phone: [phone]   Fax:  [phone]  Name: [name] MRN: [id number] Date of Birth: 10-21-1975

## 2019-09-08 ENCOUNTER — Encounter (HOSPITAL_COMMUNITY): Payer: Self-pay | Admitting: Physical Therapy

## 2019-09-08 ENCOUNTER — Ambulatory Visit (HOSPITAL_COMMUNITY): Payer: PRIVATE HEALTH INSURANCE | Attending: Specialist | Admitting: Physical Therapy

## 2019-09-08 DIAGNOSIS — M25511 Pain in right shoulder: Secondary | ICD-10-CM | POA: Diagnosis present

## 2019-09-08 DIAGNOSIS — R29898 Other symptoms and signs involving the musculoskeletal system: Secondary | ICD-10-CM

## 2019-09-08 DIAGNOSIS — M25512 Pain in left shoulder: Secondary | ICD-10-CM | POA: Diagnosis present

## 2019-09-08 NOTE — Therapy (Signed)
[place], [place], [address] Phone: [phone]   Fax:  [phone]  Physical Therapy Treatment  Patient Details  Name: [name] MRN: [id number] Date of Birth: 11-13-1975 Referring Provider (PT): Sydnee Cabal   Encounter Date: 09/08/2019  PT End of Session - 09/08/19 1618    Visit Number  6    Number of Visits  12    Date for PT Re-Evaluation  10/04/19   eval 08/23/19   Authorization Type  worker's compensation, 8 additional visits approved on top of original 12 (4 left over from OT)    Authorization - Visit Number  6    Authorization - Number of Visits  12    Progress Note Due on Visit  10    PT Start Time  1618    PT Stop Time  1705    PT Time Calculation (min)  47 min    Activity Tolerance  Patient tolerated treatment well;Patient limited by pain    Behavior During Therapy  [facility] for tasks assessed/performed       Past Medical History:  Diagnosis Date  . Back pain   . GERD (gastroesophageal reflux disease)   . Palpitations   . PONV (postoperative nausea and vomiting)   . Spinal headache     Past Surgical History:  Procedure Laterality Date  . CESAREAN SECTION    . Left knee arthroscopic surgery    . WISDOM TOOTH EXTRACTION      There were no vitals filed for this visit.  Subjective Assessment - 09/08/19 1634    Subjective  Patient reports she has lots of tension and tightness today. States she is frustrated that she is still in pain, she wants to get back to work. States she can't discern the tension from the pain right now.    Pertinent History  DB    Limitations  Lifting;House hold activities    Patient Stated Goals  to be able to return to regular duty at work    Currently in Pain?  Yes    Pain Location  Shoulder    Pain Orientation  Left;Right;Anterior;Posterior;Medial;Lateral    Pain Descriptors / Indicators  Aching;Tightness;Nagging;Sharp    Pain Onset  More than a month ago          [facility] PT Assessment - 09/08/19 0001      Assessment   Medical Diagnosis  bilateral shoulder pain    Referring Provider (PT)  Sydnee Cabal    Onset Date/Surgical Date  05/23/19                    [facility] Adult PT Treatment/Exercise - 09/08/19 0001      Shoulder Exercises: Supine   Other Supine Exercises  diaphragmatic breathing - 5 minutes    Other Supine Exercises  1/2 foam roller down back - not tolerated       Shoulder Exercises: Seated   Other Seated Exercises  lat stretch wtih box between hands and over pressure at elbowx 2x5 10" holds B      Manual Therapy   Manual Therapy  Joint mobilization;Soft tissue mobilization    Manual therapy comments  manual therapy performed independently of other interventions    Joint Mobilization  AP ro left shoulder and inf glide - tolerated well -grade IIIII    Soft tissue mobilization  STM to left lats, deltoid, and biceps        Trigger Point Dry Needling -  09/08/19 0001    Consent Given?  Yes    Education Handout Provided  Previously provided    Muscles Treated Upper Quadrant  Deltoid;Biceps    Dry Needling Comments  2 needls in left biceps and 1 needle in deltoid; left insitu for 8 minutes    Deltoid Response  Palpable increased muscle length    Biceps Response  Palpable increased muscle length           PT Education - 09/08/19 1637    Education Details  educated patient on additional strategies to help combat tension, stress and tightness. answered all questions about current presentation. Discussed and educated patient in typical healing progression and chronic pain.    Person(s) Educated  Patient    Methods  Explanation    Comprehension  Verbalized understanding       PT Short Term Goals - 08/23/19 1726      PT SHORT TERM GOAL #1   Title  Patient will report at least 50% improvement in overall symptoms and/or functional ability.    Time  3    Period  Weeks    Status  New    Target Date  09/13/19       PT SHORT TERM GOAL #2   Title  Patient will be independent in self management strategies to improve quality of life and functional outcomes.    Time  3    Period  Weeks    Status  New    Target Date  09/13/19        PT Long Term Goals - 08/23/19 1726      PT LONG TERM GOAL #1   Title  Patient will report at least 75% improvement in overall symptoms and/or functional ability.    Time  6    Period  Weeks    Status  New    Target Date  10/04/19      PT LONG TERM GOAL #2   Title  Patient will score with <35% impairment on FOTO to demonstrate improved functional mobility    Time  6    Period  Weeks    Status  New    Target Date  10/04/19            Plan - 09/08/19 1618    Clinical Impression Statement  Session limited secondary to increased stress and tension noted throughout shoulders and back. Did not tolerate foam roller today but did tolerate manual, breathing, stretches and needling. Decreased tension noted end of session. Educated patient in sleeping positions to help decrease pain in shoulder and discussed additional strategies to decrease tension in shoulders and muscles.    Personal Factors and Comorbidities  Comorbidity 1;Comorbidity 2    Comorbidities  DB    Examination-Activity Limitations  Carry;Lift;Hygiene/Grooming    Examination-Participation Restrictions  Cleaning;Other   work   Stability/Clinical Decision Making  Stable/Uncomplicated    Rehab Potential  Good    PT Frequency  2x / week    PT Duration  6 weeks    PT Treatment/Interventions  ADLs/Self Care Home Management;Aquatic Therapy;Biofeedback;Cryotherapy;Electrical Stimulation;Moist Heat;Traction;Balance training;Therapeutic exercise;Therapeutic activities;Functional mobility training;Stair training;Gait training;DME Instruction;Neuromuscular re-education;Cognitive remediation;Patient/family education;Manual techniques;Dry needling;Passive range of motion;Joint Manipulations;Spinal Manipulations     PT Next Visit Plan  f/u with breath work and TRA activation (upper and lower), s/l rib expansion. lat lengthing, thoracic and rib mobilizations (UPA and medial glides for ribs). DN as needed    PT Home Exercise Plan  5/10 towel roll down  spine, deep breathing; 5/11 self mobilization to ribs and muscles (focused anteriorly for easy access); 5/19 foot mobility and self mobilization exercise    Consulted and Agree with Plan of Care  Patient       Patient will benefit from skilled therapeutic intervention in order to improve the following deficits and impairments:  Pain, Impaired UE functional use, Decreased activity tolerance, Impaired flexibility, Hypomobility  Visit Diagnosis: Acute pain of right shoulder  Acute pain of left shoulder  Other symptoms and signs involving the musculoskeletal system     Problem List Patient Active Problem List   Diagnosis Date Noted  . Palpitations 07/26/2011    5:10 PM, 09/08/19 Jerene Pitch, DPT Physical Therapy with [facility]  [phone] office  [place] [address] [place], [place], [address] Phone: [phone]   Fax:  [phone]  Name: [name] MRN: [id number] Date of Birth: 07-Apr-1976

## 2019-09-14 ENCOUNTER — Encounter (HOSPITAL_COMMUNITY): Payer: Self-pay | Admitting: Physical Therapy

## 2019-09-14 ENCOUNTER — Other Ambulatory Visit: Payer: Self-pay

## 2019-09-14 ENCOUNTER — Ambulatory Visit (HOSPITAL_COMMUNITY): Payer: PRIVATE HEALTH INSURANCE | Attending: Specialist | Admitting: Physical Therapy

## 2019-09-14 DIAGNOSIS — R29898 Other symptoms and signs involving the musculoskeletal system: Secondary | ICD-10-CM

## 2019-09-14 DIAGNOSIS — M25512 Pain in left shoulder: Secondary | ICD-10-CM | POA: Diagnosis present

## 2019-09-14 DIAGNOSIS — M25511 Pain in right shoulder: Secondary | ICD-10-CM | POA: Diagnosis present

## 2019-09-14 NOTE — Therapy (Signed)
[place], [place], [address] Phone: [phone]   Fax:  [phone]  Physical Therapy Treatment  Patient Details  Name: [name] MRN: [id number] Date of Birth: 1975-08-03 Referring Provider (PT): Sydnee Cabal   Encounter Date: 09/14/2019  PT End of Session - 09/14/19 1437    Visit Number  7    Number of Visits  12    Date for PT Re-Evaluation  10/04/19   eval 08/23/19   Authorization Type  worker's compensation, 8 additional visits approved on top of original 12 (4 left over from OT)    Authorization - Visit Number  7    Authorization - Number of Visits  12    Progress Note Due on Visit  10    PT Start Time  1435    PT Stop Time  1531    PT Time Calculation (min)  56 min    Activity Tolerance  Patient tolerated treatment well;Patient limited by pain    Behavior During Therapy  WFL for tasks assessed/performed       Past Medical History:  Diagnosis Date   Back pain    GERD (gastroesophageal reflux disease)    Palpitations    PONV (postoperative nausea and vomiting)    Spinal headache     Past Surgical History:  Procedure Laterality Date   CESAREAN SECTION     Left knee arthroscopic surgery     WISDOM TOOTH EXTRACTION      There were no vitals filed for this visit.  Subjective Assessment - 09/14/19 1704    Subjective  States she is having some pain in her left clavicle and some tightness in her right armpit. Reports she felt better after last session. She sees the MD tomorrow.    Pertinent History  DB    Limitations  Lifting;House hold activities    Patient Stated Goals  to be able to return to regular duty at work    Currently in Pain?  Yes    Pain Score  3     Pain Location  Shoulder    Pain Orientation  Right;Left;Posterior;Anterior    Pain Descriptors / Indicators  Aching;Tightness;Nagging    Pain Type  Chronic pain    Pain Onset  More than a month ago         [facility] PT  Assessment - 09/14/19 0001      Assessment   Medical Diagnosis  bilateral shoulder pain    Referring Provider (PT)  Sydnee Cabal    Onset Date/Surgical Date  05/23/19    Hand Dominance  Right      Observation/Other Assessments   Focus on Therapeutic Outcomes (FOTO)   59% function   was 54% function                   [facility] Adult PT Treatment/Exercise - 09/14/19 0001      Shoulder Exercises: Seated   Other Seated Exercises  self mobilization to axilla region with tennis ball - PT assist at first - the pt performed - contract relax into ball - performed Bilaterally 10 minutes total       Shoulder Exercises: Standing   Other Standing Exercises  shoulder scpation in doorway with isometric presses at different parts of motion - B - 5 minutes       Manual Therapy   Manual Therapy  Joint mobilization;Soft tissue mobilization;Manual Traction    Manual therapy comments  manual therapy  performed independently of other interventions    Joint Mobilization  UPA and CPA to C7- T2 grade II/III; UPA L rib 2-5 grade II/III, Inf and sup mob L clavicle  at sternal joint - grade II/III     Soft tissue mobilization  STM to subclavius, pec minor (L) mthoracic paraspinals B and R lat    Manual Traction  gentle cervical traction x8 45-60" bouts              PT Education - 09/14/19 1703    Education Details  Educated patient on current condition, on how symptoms are upregulated, on progress made while in therapy and anticipated progress to be made. Reviewed FOTO score. on self traction device    Person(s) Educated  Patient    Methods  Explanation    Comprehension  Verbalized understanding       PT Short Term Goals - 08/23/19 1726      PT SHORT TERM GOAL #1   Title  Patient will report at least 50% improvement in overall symptoms and/or functional ability.    Time  3    Period  Weeks    Status  New    Target Date  09/13/19      PT SHORT TERM GOAL #2   Title  Patient will be  independent in self management strategies to improve quality of life and functional outcomes.    Time  3    Period  Weeks    Status  New    Target Date  09/13/19        PT Long Term Goals - 08/23/19 1726      PT LONG TERM GOAL #1   Title  Patient will report at least 75% improvement in overall symptoms and/or functional ability.    Time  6    Period  Weeks    Status  New    Target Date  10/04/19      PT LONG TERM GOAL #2   Title  Patient will score with <35% impairment on FOTO to demonstrate improved functional mobility    Time  6    Period  Weeks    Status  New    Target Date  10/04/19            Plan - 09/14/19 1705    Clinical Impression Statement  Patient continue to benefit from manual interventions. No pain noted end of session. Patient able to abolish deep armpit pain with self mobilization with tennis ball. Reduced symptoms also noted with cervical traction so patient was educated in home traction unit. Less pain also noted with shoulder exercise in doorway. Left clavicular musculature still very painful. Patient is improving and would continue to benefit from skilled physical therapy to improve functional mobility.    Personal Factors and Comorbidities  Comorbidity 1;Comorbidity 2    Comorbidities  DB    Examination-Activity Limitations  Carry;Lift;Hygiene/Grooming    Examination-Participation Restrictions  Cleaning;Other   work   Stability/Clinical Decision Making  Stable/Uncomplicated    Rehab Potential  Good    PT Frequency  2x / week    PT Duration  6 weeks    PT Treatment/Interventions  ADLs/Self Care Home Management;Aquatic Therapy;Biofeedback;Cryotherapy;Electrical Stimulation;Moist Heat;Traction;Balance training;Therapeutic exercise;Therapeutic activities;Functional mobility training;Stair training;Gait training;DME Instruction;Neuromuscular re-education;Cognitive remediation;Patient/family education;Manual techniques;Dry needling;Passive range of  motion;Joint Manipulations;Spinal Manipulations    PT Next Visit Plan  f/u with breath work and TRA activation (upper and lower), s/l rib expansion. lat lengthing, thoracic and rib mobilizations (  UPA and medial glides for ribs). DN as needed    PT Home Exercise Plan  5/10 towel roll down spine, deep breathing; 5/11 self mobilization to ribs and muscles (focused anteriorly for easy access); 5/19 foot mobility and self mobilization exercise; 6/1 self mob with tennis ball    Consulted and Agree with Plan of Care  Patient       Patient will benefit from skilled therapeutic intervention in order to improve the following deficits and impairments:  Pain, Impaired UE functional use, Decreased activity tolerance, Impaired flexibility, Hypomobility  Visit Diagnosis: Acute pain of right shoulder  Acute pain of left shoulder  Other symptoms and signs involving the musculoskeletal system     Problem List Patient Active Problem List   Diagnosis Date Noted   Palpitations 07/26/2011    5:14 PM, 09/14/19 Jerene Pitch, DPT Physical Therapy with [facility]  [phone] office   [place], [place], [address] Phone: [phone]   Fax:  [phone]  Name: [name] MRN: [id number] Date of Birth: 05-04-1975

## 2019-09-16 ENCOUNTER — Other Ambulatory Visit: Payer: Self-pay

## 2019-09-16 ENCOUNTER — Encounter (HOSPITAL_COMMUNITY): Payer: Self-pay | Admitting: Physical Therapy

## 2019-09-16 ENCOUNTER — Ambulatory Visit (HOSPITAL_COMMUNITY): Payer: PRIVATE HEALTH INSURANCE | Admitting: Physical Therapy

## 2019-09-16 DIAGNOSIS — M25511 Pain in right shoulder: Secondary | ICD-10-CM | POA: Diagnosis not present

## 2019-09-16 DIAGNOSIS — M25512 Pain in left shoulder: Secondary | ICD-10-CM

## 2019-09-16 DIAGNOSIS — R29898 Other symptoms and signs involving the musculoskeletal system: Secondary | ICD-10-CM

## 2019-09-16 NOTE — Therapy (Signed)
[place], [place], [address] Phone: [phone]   Fax:  [phone]  Physical Therapy Treatment  Patient Details  Name: LORRINDA MAZER MRN: [id number] Date of Birth: 10-31-1975 Referring Provider (PT): Sydnee Cabal   Encounter Date: 09/16/2019  PT End of Session - 09/16/19 1356    Visit Number  8    Number of Visits  12    Date for PT Re-Evaluation  10/04/19   eval 08/23/19   Authorization Type  worker's compensation, 8 additional visits approved on top of original 12 (4 left over from OT)    Authorization - Visit Number  8    Authorization - Number of Visits  12    Progress Note Due on Visit  10    PT Start Time  1315    PT Stop Time  1400    PT Time Calculation (min)  45 min    Activity Tolerance  Patient tolerated treatment well;Patient limited by pain    Behavior During Therapy  WFL for tasks assessed/performed       Past Medical History:  Diagnosis Date   Back pain    GERD (gastroesophageal reflux disease)    Palpitations    PONV (postoperative nausea and vomiting)    Spinal headache     Past Surgical History:  Procedure Laterality Date   CESAREAN SECTION     Left knee arthroscopic surgery     WISDOM TOOTH EXTRACTION      There were no vitals filed for this visit.  Subjective Assessment - 09/16/19 1338    Subjective  States that her right shoulder is still really tight and down into her elbow. States that she still cant lift a bottle of water in front.  left clavicle not as pulling as bad. States she saw the MD/PA and they want her to continue for an additional 6 weeks.    Pertinent History  DB    Limitations  Lifting;House hold activities    Patient Stated Goals  to be able to return to regular duty at work    Currently in Pain?  Yes    Pain Score  3     Pain Location  Shoulder    Pain Orientation  Right;Left    Pain Radiating Towards  down right arm to elbow    Pain Onset   More than a month ago         [facility] PT Assessment - 09/16/19 0001      Assessment   Medical Diagnosis  bilateral shoulder pain    Referring Provider (PT)  Sydnee Cabal    Onset Date/Surgical Date  05/23/19    Hand Dominance  Right    Next MD Visit  10/27/19                    [facility] Adult PT Treatment/Exercise - 09/16/19 0001      Neck Exercises: Seated   Other Seated Exercise  fascial stretch with cervical flexion/extension with breathing - 5 minutes - lumbar flexion ; fascial stretch with foam roller in low back  with trunk flexed x10 10" holds       Manual Therapy   Manual Therapy  Joint mobilization;Soft tissue mobilization;Myofascial release;Manual Traction    Manual therapy comments  manual therapy performed independently of other interventions    Joint Mobilization  UPA and CPA to C7- T2 grade II/III; UPA r ribs 5-12 grade II/III.  Soft tissue mobilization  STM to R triceps, infraspinatus, cervical and thoracic paraspinals and suboccipitals.    Myofascial Release  fascial stretch to thoracic and lumbar fascia - tolerated well    Manual Traction  cervical traction and decompression in prone - 60 second holds x6       Trigger Point Dry Needling - 09/16/19 0001    Consent Given?  Yes    Education Handout Provided  Previously provided    Muscles Treated Upper Quadrant  Triceps    Dry Needling Comments  3 needles in right triceps     Triceps Response  Twitch response elicited;Palpable increased muscle length           PT Education - 09/16/19 1504    Education Details  on fascia restrictions, on fascial stretches.    Person(s) Educated  Patient    Methods  Explanation    Comprehension  Verbalized understanding       PT Short Term Goals - 08/23/19 1726      PT SHORT TERM GOAL #1   Title  Patient will report at least 50% improvement in overall symptoms and/or functional ability.    Time  3    Period  Weeks    Status  New    Target Date   09/13/19      PT SHORT TERM GOAL #2   Title  Patient will be independent in self management strategies to improve quality of life and functional outcomes.    Time  3    Period  Weeks    Status  New    Target Date  09/13/19        PT Long Term Goals - 08/23/19 1726      PT LONG TERM GOAL #1   Title  Patient will report at least 75% improvement in overall symptoms and/or functional ability.    Time  6    Period  Weeks    Status  New    Target Date  10/04/19      PT LONG TERM GOAL #2   Title  Patient will score with <35% impairment on FOTO to demonstrate improved functional mobility    Time  6    Period  Weeks    Status  New    Target Date  10/04/19            Plan - 09/16/19 1504    Clinical Impression Statement  Dry needles left in situ during manual work with occasional twisting. Tolerated manual work well and reported no pain in shoulders end of session. Tension in upper traps notable decreased after manual work to surrounding tissues. Minor discomfort (soreness) noted in low back after fascial work but this was abolished after fascial stretches. Will follow up with fascial tightness and stretches next session.    Personal Factors and Comorbidities  Comorbidity 1;Comorbidity 2    Comorbidities  DB    Examination-Activity Limitations  Carry;Lift;Hygiene/Grooming    Examination-Participation Restrictions  Cleaning;Other   work   Stability/Clinical Decision Making  Stable/Uncomplicated    Rehab Potential  Good    PT Frequency  2x / week    PT Duration  6 weeks    PT Treatment/Interventions  ADLs/Self Care Home Management;Aquatic Therapy;Biofeedback;Cryotherapy;Electrical Stimulation;Moist Heat;Traction;Balance training;Therapeutic exercise;Therapeutic activities;Functional mobility training;Stair training;Gait training;DME Instruction;Neuromuscular re-education;Cognitive remediation;Patient/family education;Manual techniques;Dry needling;Passive range of motion;Joint  Manipulations;Spinal Manipulations    PT Next Visit Plan  f/u with breath work and TRA activation (upper and lower), s/l rib expansion.  lat lengthing, thoracic and rib mobilizations (UPA and medial glides for ribs). DN as needed    PT Home Exercise Plan  5/10 towel roll down spine, deep breathing; 5/11 self mobilization to ribs and muscles (focused anteriorly for easy access); 5/19 foot mobility and self mobilization exercise; 6/1 self mob with tennis ball; 6/3 fascial stretches    Consulted and Agree with Plan of Care  Patient       Patient will benefit from skilled therapeutic intervention in order to improve the following deficits and impairments:  Pain, Impaired UE functional use, Decreased activity tolerance, Impaired flexibility, Hypomobility  Visit Diagnosis: Acute pain of right shoulder  Acute pain of left shoulder  Other symptoms and signs involving the musculoskeletal system     Problem List Patient Active Problem List   Diagnosis Date Noted   Palpitations 07/26/2011   3:08 PM, 09/16/19 Jerene Pitch, DPT Physical Therapy with [facility]  [phone] office   [place], [place], [address] Phone: [phone]   Fax:  [phone]  Name: ARMANI GIESEKING MRN: [id number] Date of Birth: 1975/06/09

## 2019-09-20 DIAGNOSIS — F419 Anxiety disorder, unspecified: Secondary | ICD-10-CM | POA: Diagnosis not present

## 2019-09-21 ENCOUNTER — Ambulatory Visit (HOSPITAL_COMMUNITY): Payer: PRIVATE HEALTH INSURANCE | Attending: Specialist | Admitting: Physical Therapy

## 2019-09-21 ENCOUNTER — Encounter (HOSPITAL_COMMUNITY): Payer: Self-pay | Admitting: Physical Therapy

## 2019-09-21 ENCOUNTER — Other Ambulatory Visit: Payer: Self-pay

## 2019-09-21 DIAGNOSIS — R29898 Other symptoms and signs involving the musculoskeletal system: Secondary | ICD-10-CM | POA: Diagnosis present

## 2019-09-21 DIAGNOSIS — M25511 Pain in right shoulder: Secondary | ICD-10-CM | POA: Diagnosis present

## 2019-09-21 DIAGNOSIS — M25512 Pain in left shoulder: Secondary | ICD-10-CM | POA: Diagnosis present

## 2019-09-21 NOTE — Therapy (Signed)
[place], [place], [address] Phone: [phone]   Fax:  [phone]  Physical Therapy Treatment  Patient Details  Name: [name] MRN: [id number] Date of Birth: 1975/05/14 Referring Provider (PT): Sydnee Cabal   Encounter Date: 09/21/2019  PT End of Session - 09/21/19 1030    Visit Number  9    Number of Visits  12    Date for PT Re-Evaluation  10/04/19   eval 08/23/19   Authorization Type  worker's compensation, 8 additional visits approved on top of original 12 (4 left over from OT), additional 12 visits approved from workers comp    Authorization - Visit Number  9    Authorization - Number of Visits  24    Progress Note Due on Visit  10    PT Start Time  1008    PT Stop Time  1047    PT Time Calculation (min)  39 min    Activity Tolerance  Patient tolerated treatment well;Patient limited by pain    Behavior During Therapy  [facility] for tasks assessed/performed       Past Medical History:  Diagnosis Date  . Back pain   . GERD (gastroesophageal reflux disease)   . Palpitations   . PONV (postoperative nausea and vomiting)   . Spinal headache     Past Surgical History:  Procedure Laterality Date  . CESAREAN SECTION    . Left knee arthroscopic surgery    . WISDOM TOOTH EXTRACTION      There were no vitals filed for this visit.  Subjective Assessment - 09/21/19 1010    Subjective  Patient states L shoulder is hurting a lot and she is not sure why. She still feels like she has a tennis ball in her arm pit. States she recently got her traction unit and that really helps and feels good.    Pertinent History  DB    Limitations  Lifting;House hold activities    Patient Stated Goals  to be able to return to regular duty at work    Currently in Pain?  Yes    Pain Score  2     Pain Location  Shoulder    Pain Orientation  Right;Left    Pain Onset  More than a month ago         [facility] PT Assessment -  09/21/19 0001      Assessment   Medical Diagnosis  bilateral shoulder pain    Referring Provider (PT)  Sydnee Cabal    Onset Date/Surgical Date  05/23/19    Hand Dominance  Right                    [facility] Adult PT Treatment/Exercise - 09/21/19 0001      Shoulder Exercises: Seated   Other Seated Exercises  lat stretch wtih box between hands and over pressure at elbowx 2x5 10" holds B      Manual Therapy   Manual Therapy  Joint mobilization;Soft tissue mobilization;Myofascial release;Manual Traction    Manual therapy comments  manual therapy performed independently of other interventions    Joint Mobilization  UPA and CPA to C7- T2 grade II/III; UPA r and L ribs 2-12 grade II/III. ; left shoulder AP grade II/III. L shoulder traction with shoulder ROM     Soft tissue mobilization  STM to right post deltoid, infra, lat and subcalvius  PT Short Term Goals - 08/23/19 1726      PT SHORT TERM GOAL #1   Title  Patient will report at least 50% improvement in overall symptoms and/or functional ability.    Time  3    Period  Weeks    Status  New    Target Date  09/13/19      PT SHORT TERM GOAL #2   Title  Patient will be independent in self management strategies to improve quality of life and functional outcomes.    Time  3    Period  Weeks    Status  New    Target Date  09/13/19        PT Long Term Goals - 08/23/19 1726      PT LONG TERM GOAL #1   Title  Patient will report at least 75% improvement in overall symptoms and/or functional ability.    Time  6    Period  Weeks    Status  New    Target Date  10/04/19      PT LONG TERM GOAL #2   Title  Patient will score with <35% impairment on FOTO to demonstrate improved functional mobility    Time  6    Period  Weeks    Status  New    Target Date  10/04/19            Plan - 09/21/19 1029    Clinical Impression Statement  Patient experiences reduction in L shoulder pain following  Upper rib/ L UPA mobilizations. She also experiences reduction on pressure in R axillary region following mid R rib mobilizations. Improvement in Left shoudler symptoms with traction and joint mobilization. End of session overall symptoms improved but continued discomfort noted in right shoulder but left symptoms abolished.    Personal Factors and Comorbidities  Comorbidity 1;Comorbidity 2    Comorbidities  DB    Examination-Activity Limitations  Carry;Lift;Hygiene/Grooming    Examination-Participation Restrictions  Cleaning;Other   work   Stability/Clinical Decision Making  Stable/Uncomplicated    Rehab Potential  Good    PT Frequency  2x / week    PT Duration  6 weeks    PT Treatment/Interventions  ADLs/Self Care Home Management;Aquatic Therapy;Biofeedback;Cryotherapy;Electrical Stimulation;Moist Heat;Traction;Balance training;Therapeutic exercise;Therapeutic activities;Functional mobility training;Stair training;Gait training;DME Instruction;Neuromuscular re-education;Cognitive remediation;Patient/family education;Manual techniques;Dry needling;Passive range of motion;Joint Manipulations;Spinal Manipulations    PT Next Visit Plan  RECERT/PN next session, f/u with breath work and TRA activation (upper and lower), s/l rib expansion. lat lengthing, thoracic and rib mobilizations (UPA and medial glides for ribs). DN as needed    PT Home Exercise Plan  5/10 towel roll down spine, deep breathing; 5/11 self mobilization to ribs and muscles (focused anteriorly for easy access); 5/19 foot mobility and self mobilization exercise; 6/1 self mob with tennis ball; 6/3 fascial stretches    Consulted and Agree with Plan of Care  Patient       Patient will benefit from skilled therapeutic intervention in order to improve the following deficits and impairments:  Pain, Impaired UE functional use, Decreased activity tolerance, Impaired flexibility, Hypomobility  Visit Diagnosis: Acute pain of right  shoulder  Acute pain of left shoulder  Other symptoms and signs involving the musculoskeletal system     Problem List Patient Active Problem List   Diagnosis Date Noted  . Palpitations 07/26/2011    1:16 PM, 09/21/19 Jerene Pitch, DPT Physical Therapy with [facility]  [phone] office  [place]  [facility] [place], [place], [address] Phone: [phone]   Fax:  [phone]  Name: [name] MRN: [id number] Date of Birth: 04-04-1976

## 2019-09-23 ENCOUNTER — Other Ambulatory Visit: Payer: Self-pay

## 2019-09-23 ENCOUNTER — Ambulatory Visit (HOSPITAL_COMMUNITY): Payer: PRIVATE HEALTH INSURANCE | Attending: Specialist | Admitting: Physical Therapy

## 2019-09-23 ENCOUNTER — Encounter (HOSPITAL_COMMUNITY): Payer: Self-pay | Admitting: Physical Therapy

## 2019-09-23 DIAGNOSIS — M25512 Pain in left shoulder: Secondary | ICD-10-CM | POA: Diagnosis present

## 2019-09-23 DIAGNOSIS — M25511 Pain in right shoulder: Secondary | ICD-10-CM

## 2019-09-23 DIAGNOSIS — R29898 Other symptoms and signs involving the musculoskeletal system: Secondary | ICD-10-CM | POA: Diagnosis present

## 2019-09-23 NOTE — Therapy (Signed)
[place], [place], [address] Phone: [phone]   Fax:  [phone]  Physical Therapy Treatment  Patient Details  Name: [name] MRN: [id number] Date of Birth: April 27, 1975 Referring Provider (PT): Sydnee Cabal   Encounter Date: 09/23/2019   PT End of Session - 09/23/19 1212    Visit Number 10    Number of Visits 12    Date for PT Re-Evaluation 10/04/19   eval 08/23/19   Authorization Type worker's compensation, 8 additional visits approved on top of original 12 (4 left over from OT), additional 12 visits approved from workers comp    Authorization - Visit Number 10    Authorization - Number of Visits 24    Progress Note Due on Visit 10    PT Start Time 1133    PT Stop Time 1213    PT Time Calculation (min) 40 min    Activity Tolerance Patient tolerated treatment well;Patient limited by pain    Behavior During Therapy [facility] for tasks assessed/performed           Past Medical History:  Diagnosis Date  . Back pain   . GERD (gastroesophageal reflux disease)   . Palpitations   . PONV (postoperative nausea and vomiting)   . Spinal headache     Past Surgical History:  Procedure Laterality Date  . CESAREAN SECTION    . Left knee arthroscopic surgery    . WISDOM TOOTH EXTRACTION      There were no vitals filed for this visit.   Subjective Assessment - 09/23/19 1156    Subjective States pain is about the same. Thinks she slept on her left side wrong and that pain is wrapping around and then goes across her clavicle.Marland Kitchen    Pertinent History DB    Limitations Lifting;House hold activities    Patient Stated Goals to be able to return to regular duty at work    Currently in Pain? Yes    Pain Score 4     Pain Location Shoulder    Pain Orientation Left    Pain Descriptors / Indicators Aching;Tightness;Throbbing    Pain Onset More than a month ago              [facility] PT Assessment - 09/23/19 0001       Assessment   Medical Diagnosis bilateral shoulder pain    Referring Provider (PT) Sydnee Cabal    Onset Date/Surgical Date 05/23/19    Hand Dominance Right                         [facility] Adult PT Treatment/Exercise - 09/23/19 0001      Manual Therapy   Manual Therapy Joint mobilization;Soft tissue mobilization    Manual therapy comments manual therapy performed independently of other interventions    Joint Mobilization AP of right shoulder grade II/III , medial glides to cervical spine grade II/II B     Soft tissue mobilization STM to left deltoid, biceps, UT, peci and bilateral cervical paraspinals             Trigger Point Dry Needling - 09/23/19 0001    Consent Given? Yes    Education Handout Provided Previously provided    Muscles Treated Upper Quadrant Deltoid;Biceps    Dry Needling Comments 2 needls in biceps and 2 needles in deltoid- left in situ fo 15 minutes with twistng and pistoning     Deltoid  Response Palpable increased muscle length;Twitch response elicited    Biceps Response Palpable increased muscle length                  PT Short Term Goals - 08/23/19 1726      PT SHORT TERM GOAL #1   Title Patient will report at least 50% improvement in overall symptoms and/or functional ability.    Time 3    Period Weeks    Status New    Target Date 09/13/19      PT SHORT TERM GOAL #2   Title Patient will be independent in self management strategies to improve quality of life and functional outcomes.    Time 3    Period Weeks    Status New    Target Date 09/13/19             PT Long Term Goals - 08/23/19 1726      PT LONG TERM GOAL #1   Title Patient will report at least 75% improvement in overall symptoms and/or functional ability.    Time 6    Period Weeks    Status New    Target Date 10/04/19      PT LONG TERM GOAL #2   Title Patient will score with <35% impairment on FOTO to demonstrate improved functional mobility    Time  6    Period Weeks    Status New    Target Date 10/04/19                 Plan - 09/23/19 1222    Clinical Impression Statement Focused on dry needling and manual mobilization today. Able to reproduce left clavicle symptoms with needling to deltoid and biceps. Left needles in situ for 15 minutes with occasional twisting and pistoning. Patient reported cooling sensation in L shape referral pattern post needling and decreased tension across neck and shoulders. Reports she had minor achy soreness end of session but no pain.    Personal Factors and Comorbidities Comorbidity 1;Comorbidity 2    Comorbidities DB    Examination-Activity Limitations Carry;Lift;Hygiene/Grooming    Examination-Participation Restrictions Cleaning;Other   work   Stability/Clinical Decision Making Stable/Uncomplicated    Rehab Potential Good    PT Frequency 2x / week    PT Duration 6 weeks    PT Treatment/Interventions ADLs/Self Care Home Management;Aquatic Therapy;Biofeedback;Cryotherapy;Electrical Stimulation;Moist Heat;Traction;Balance training;Therapeutic exercise;Therapeutic activities;Functional mobility training;Stair training;Gait training;DME Instruction;Neuromuscular re-education;Cognitive remediation;Patient/family education;Manual techniques;Dry needling;Passive range of motion;Joint Manipulations;Spinal Manipulations    PT Next Visit Plan f/u with breath work and TRA activation (upper and lower), s/l rib expansion. lat lengthing, thoracic and rib mobilizations (UPA and medial glides for ribs). DN as needed    PT Home Exercise Plan 5/10 towel roll down spine, deep breathing; 5/11 self mobilization to ribs and muscles (focused anteriorly for easy access); 5/19 foot mobility and self mobilization exercise; 6/1 self mob with tennis ball; 6/3 fascial stretches    Consulted and Agree with Plan of Care Patient           Patient will benefit from skilled therapeutic intervention in order to improve the  following deficits and impairments:  Pain, Impaired UE functional use, Decreased activity tolerance, Impaired flexibility, Hypomobility  Visit Diagnosis: Acute pain of right shoulder  Acute pain of left shoulder  Other symptoms and signs involving the musculoskeletal system     Problem List Patient Active Problem List   Diagnosis Date Noted  . Palpitations 07/26/2011    12:23 PM,  09/23/19 Jerene Pitch, DPT Physical Therapy with [facility]  [phone] office   [place] [address] [place], [place], [address] Phone: [phone]   Fax:  [phone]  Name: [name] MRN: [id number] Date of Birth: 01-29-1976

## 2019-09-28 ENCOUNTER — Other Ambulatory Visit: Payer: Self-pay

## 2019-09-28 ENCOUNTER — Ambulatory Visit (HOSPITAL_COMMUNITY): Payer: PRIVATE HEALTH INSURANCE | Attending: Specialist | Admitting: Physical Therapy

## 2019-09-28 ENCOUNTER — Encounter (HOSPITAL_COMMUNITY): Payer: Self-pay | Admitting: Physical Therapy

## 2019-09-28 DIAGNOSIS — R29898 Other symptoms and signs involving the musculoskeletal system: Secondary | ICD-10-CM | POA: Insufficient documentation

## 2019-09-28 DIAGNOSIS — M25511 Pain in right shoulder: Secondary | ICD-10-CM | POA: Insufficient documentation

## 2019-09-28 DIAGNOSIS — M25512 Pain in left shoulder: Secondary | ICD-10-CM | POA: Insufficient documentation

## 2019-09-28 NOTE — Therapy (Signed)
[facility] [address] [place], [place], [address] Phone: [phone]   Fax:  [phone]  Physical Therapy Treatment and Progress Note and RECERT  Patient Details  Name: [name] MRN: [id number] Date of Birth: 04/18/1975 Referring Provider (PT): Eugenia Mcalpine  Progress Note Reporting Period 5/10/21to 09/28/19  See note below for Objective Data and Assessment of Progress/Goals.       Encounter Date: 09/28/2019   PT End of Session - 09/28/19 1005    Visit Number 11    Number of Visits 22    Date for PT Re-Evaluation 11/02/19   eval 08/23/19   Authorization Type worker's compensation, 8 additional visits approved on top of original 12 (4 left over from OT), additional 12 visits approved from workers Special educational needs teacher - Visit Number 11    Authorization - Number of Visits 24    Progress Note Due on Visit 21    PT Start Time 1004    PT Stop Time 1044    PT Time Calculation (min) 40 min    Activity Tolerance Patient tolerated treatment well;Patient limited by pain    Behavior During Therapy WFL for tasks assessed/performed           Past Medical History:  Diagnosis Date  . Back pain   . GERD (gastroesophageal reflux disease)   . Palpitations   . PONV (postoperative nausea and vomiting)   . Spinal headache     Past Surgical History:  Procedure Laterality Date  . CESAREAN SECTION    . Left knee arthroscopic surgery    . WISDOM TOOTH EXTRACTION      There were no vitals filed for this visit.   Subjective Assessment - 09/28/19 1008    Subjective Patient reports she feels 75% better since the start of PT. States that she is still having some of the occasional tightness and pain but it is not nearly as it was. States she still feels tight but things don't seem to be catching as much.    Pertinent History DB    Limitations Lifting;House hold activities    Patient Stated Goals to be able to return to regular duty at  work    Currently in Pain? Yes    Pain Score 1     Pain Location Shoulder    Pain Orientation Left   and right armpit   Pain Descriptors / Indicators Aching    Pain Type Chronic pain    Pain Onset More than a month ago              [facility] PT Assessment - 09/28/19 0001      Assessment   Medical Diagnosis bilateral shoulder pain    Referring Provider (PT) Eugenia Mcalpine    Onset Date/Surgical Date 05/23/19    Hand Dominance Right      Observation/Other Assessments   Focus on Therapeutic Outcomes (FOTO)  66% function    was 59% function,      Functional Tests   Functional tests Other;Other2      Other:   Other/ Comments mimic assisting mom with breast feeding - 5# weight in bent over position  for 30 second - no problem - with 8# weight unable without pain        AROM   Right Shoulder Flexion 180 Degrees    Right Shoulder ABduction 180 Degrees    Right Shoulder Internal Rotation 90 Degrees    Right Shoulder External  Rotation 65 Degrees    Left Shoulder Flexion 180 Degrees    Left Shoulder ABduction 180 Degrees    Left Shoulder Internal Rotation 90 Degrees    Left Shoulder External Rotation 65 Degrees      PROM   Overall PROM Comments P/ROM is WNL                         [facility] Adult PT Treatment/Exercise - 09/28/19 0001      Therapeutic Activites    Therapeutic Activities Work Simulation    Work Therapist, sports 5# weight out in front of her for 30 seconds x5 times - breaks in between reps      Shoulder Exercises: Standing   Other Standing Exercises breathign trhoughout session and between reps to improve muscle relaxation.     Other Standing Exercises self mobilizaiton with tennsi ball to posteiorr shoulder blade, and armpit - B                    PT Education - 09/28/19 1040    Education Details on self mobilizaiton and how to perform, on breathwork at work and between tasks. goals, FOTO score and plan moving forward.    Person(s)  Educated Patient    Methods Explanation    Comprehension Verbalized understanding            PT Short Term Goals - 09/28/19 1009      PT SHORT TERM GOAL #1   Title Patient will report at least 50% improvement in overall symptoms and/or functional ability.    Baseline 75% better    Time 3    Period Weeks    Status Achieved    Target Date 09/13/19      PT SHORT TERM GOAL #2   Title Patient will be independent in self management strategies to improve quality of life and functional outcomes.    Baseline able to all without difficulties have bought traction device, tennis ball and foam roller    Time 3    Period Weeks    Status Achieved    Target Date 09/13/19             PT Long Term Goals - 09/28/19 1010      PT LONG TERM GOAL #1   Title Patient will report at least 75% improvement in overall symptoms and/or functional ability.    Baseline 75% better    Time 6    Period Weeks    Status Achieved      PT LONG TERM GOAL #2   Title Patient will score with <35% impairment on FOTO to demonstrate improved functional mobility    Baseline 66% function, 34% limitation    Time 6    Period Weeks    Status Achieved      PT LONG TERM GOAL #3   Title Patient will be able to lift IV pump (approximately 10 pounds) to about chest height and hold it there for 30 seconds while screwing it into place with contralateral arm to perform required work related tasks.    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    Time 5    Period Weeks    Status New    Target Date 11/02/19      PT LONG TERM GOAL #5   Title --    Time --    Period --    Status --    Target Date --                 Plan - 09/28/19 1036    Clinical  Impression Statement All goals met at this time. Extending POC to focus on improving tolerance to functional tasks that are required at work. Able to perform movements required for work required tasks but not at required weight/resistance. Focused on educating patient of self management strategies at work to perform when pain/tightness presents. Patient tolerated this well. Patient would continue to benefit from skilled physical therapy to improve ability to perform work required tasks.    Personal Factors and Comorbidities Comorbidity 1;Comorbidity 2    Comorbidities DB    Examination-Activity Limitations Carry;Lift;Hygiene/Grooming    Examination-Participation Restrictions Cleaning;Other   work   Stability/Clinical Decision Making Stable/Uncomplicated    Rehab Potential Good    PT Frequency 2x / week    PT Duration Other (comment)   5 weeks   PT Treatment/Interventions ADLs/Self Care Home Management;Aquatic Therapy;Biofeedback;Cryotherapy;Electrical Stimulation;Moist Heat;Traction;Balance training;Therapeutic exercise;Therapeutic activities;Functional mobility training;Stair training;Gait training;DME Instruction;Neuromuscular re-education;Cognitive remediation;Patient/family education;Manual techniques;Dry needling;Passive range of motion;Joint Manipulations;Spinal Manipulations    PT Next Visit Plan f/u with breath work and TRA activation (upper and lower), s/l rib expansion. lat lengthing, thoracic and rib mobilizations (UPA and medial glides for ribs). DN as needed    PT Home Exercise Plan 5/10 towel roll down spine, deep breathing; 5/11 self mobilization to ribs and muscles (focused anteriorly for easy access); 5/19 foot mobility and self mobilization exercise; 6/1 self mob with tennis ball; 6/3 fascial stretches    Consulted and Agree with Plan of Care Patient           Patient will benefit from skilled therapeutic intervention in order to improve the following deficits and impairments:   Pain, Impaired UE functional use, Decreased activity tolerance, Impaired flexibility, Hypomobility  Visit Diagnosis: Acute pain of right shoulder  Acute pain of left shoulder  Other symptoms and signs involving the musculoskeletal system     Problem List Patient Active Problem List   Diagnosis Date Noted  . Palpitations 07/26/2011  10:44 AM, 09/28/19 Jerene Pitch, DPT Physical Therapy with [facility]  [phone] office  [place] [address] [place], [place], [address] Phone: [phone]   Fax:  [phone]  Name: MIZANI DILDAY MRN: [id number] Date of Birth: 1975-12-28

## 2019-09-30 ENCOUNTER — Encounter (HOSPITAL_COMMUNITY): Payer: Self-pay | Admitting: Physical Therapy

## 2019-09-30 ENCOUNTER — Ambulatory Visit (HOSPITAL_COMMUNITY): Payer: PRIVATE HEALTH INSURANCE | Attending: Specialist | Admitting: Physical Therapy

## 2019-09-30 ENCOUNTER — Other Ambulatory Visit: Payer: Self-pay

## 2019-09-30 DIAGNOSIS — M25511 Pain in right shoulder: Secondary | ICD-10-CM | POA: Insufficient documentation

## 2019-09-30 DIAGNOSIS — R29898 Other symptoms and signs involving the musculoskeletal system: Secondary | ICD-10-CM | POA: Diagnosis present

## 2019-09-30 DIAGNOSIS — M25512 Pain in left shoulder: Secondary | ICD-10-CM | POA: Insufficient documentation

## 2019-09-30 NOTE — Therapy (Signed)
[place], [place], [address] Phone: [phone]   Fax:  [phone]  Physical Therapy Treatment  Patient Details  Name: [name] MRN: [id number] Date of Birth: 1975/11/18 Referring Provider (PT): Sydnee Cabal   Encounter Date: 09/30/2019   PT End of Session - 09/30/19 1011    Visit Number 12    Number of Visits 22    Date for PT Re-Evaluation 11/02/19   eval 08/23/19   Authorization Type worker's compensation, 8 additional visits approved on top of original 12 (4 left over from OT), additional 12 visits approved from workers comp    Authorization - Visit Number 12    Authorization - Number of Visits 24    Progress Note Due on Visit 21    PT Start Time 1000    PT Stop Time 1041    PT Time Calculation (min) 41 min    Activity Tolerance Patient tolerated treatment well;Patient limited by pain    Behavior During Therapy [facility] for tasks assessed/performed           Past Medical History:  Diagnosis Date  . Back pain   . GERD (gastroesophageal reflux disease)   . Palpitations   . PONV (postoperative nausea and vomiting)   . Spinal headache     Past Surgical History:  Procedure Laterality Date  . CESAREAN SECTION    . Left knee arthroscopic surgery    . WISDOM TOOTH EXTRACTION      There were no vitals filed for this visit.   Subjective Assessment - 09/30/19 1038    Subjective States she has been very anxious and she doesn't know why reports it is 6/10. States she slept ok until 4am and her heart was pounding. States she is a little achy on the right side but overall has been feeling good.    Pertinent History DB    Limitations Lifting;House hold activities    Patient Stated Goals to be able to return to regular duty at work    Pain Onset More than a month ago              [facility] PT Assessment - 09/30/19 0001      Assessment   Medical Diagnosis bilateral shoulder pain    Referring Provider  (PT) Sydnee Cabal    Onset Date/Surgical Date 05/23/19                         [facility] Adult PT Treatment/Exercise - 09/30/19 0001      Therapeutic Activites    Therapeutic Activities Work Simulation    Work Therapist, sports 5# weight out in front of her for 60 seconds x2times - breaks in between reps; clips to chest height pole and 1# ankle weight to wrists - clipping 5 clips on pole while holding left arm out in front to mimic attaching IV pump to IV pole' changed to 2# ankle weights x5 reps      Shoulder Exercises: Seated   Other Seated Exercises self mobilizaiton to ribs - AP - verbal cues adn tactile cues as neeed - anxiety dereased from 6/10 to 3/10 to  0/10  - 10 minutes                     PT Short Term Goals - 09/28/19 1009      PT SHORT TERM GOAL #1   Title Patient will report at least  50% improvement in overall symptoms and/or functional ability.    Baseline 75% better    Time 3    Period Weeks    Status Achieved    Target Date 09/13/19      PT SHORT TERM GOAL #2   Title Patient will be independent in self management strategies to improve quality of life and functional outcomes.    Baseline able to all without difficulties have bought traction device, tennis ball and foam roller    Time 3    Period Weeks    Status Achieved    Target Date 09/13/19             PT Long Term Goals - 09/28/19 1010      PT LONG TERM GOAL #1   Title Patient will report at least 75% improvement in overall symptoms and/or functional ability.    Baseline 75% better    Time 6    Period Weeks    Status Achieved      PT LONG TERM GOAL #2   Title Patient will score with <35% impairment on FOTO to demonstrate improved functional mobility    Baseline 66% function, 34% limitation    Time 6    Period Weeks    Status Achieved      PT LONG TERM GOAL #3   Title Patient will be able to lift IV pump (approximately 10 pounds) to about chest height and hold it there  for 30 seconds while screwing it into place with contralateral arm to perform required work related tasks.    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    Time 5    Period Weeks    Status New    Target Date 11/02/19      PT LONG TERM GOAL #5   Title --    Time --    Period --    Status --    Target Date --                 Plan - 09/30/19 1011    Clinical Impression Statement Patient tolerated session well. Able to reduce anxiety and manage symptoms with self-mobilization techniques. Fatigue noted end of session but no pain. Will continue to progress functional tasks as tolerated by patient.    Personal Factors and Comorbidities Comorbidity 1;Comorbidity 2    Comorbidities DB    Examination-Activity Limitations Carry;Lift;Hygiene/Grooming    Examination-Participation Restrictions Cleaning;Other   work   Stability/Clinical Decision Making Stable/Uncomplicated    Rehab Potential Good    PT Frequency 2x / week    PT Duration Other (comment)   5 weeks   PT Treatment/Interventions ADLs/Self Care Home Management;Aquatic Therapy;Biofeedback;Cryotherapy;Electrical Stimulation;Moist Heat;Traction;Balance training;Therapeutic exercise;Therapeutic activities;Functional mobility training;Stair training;Gait training;DME Instruction;Neuromuscular re-education;Cognitive remediation;Patient/family education;Manual techniques;Dry needling;Passive range of motion;Joint Manipulations;Spinal Manipulations    PT Next Visit Plan f/u with breath work and TRA activation (upper and lower), s/l rib expansion. lat lengthing, thoracic and rib mobilizations (UPA and medial glides for ribs). DN as needed    PT Home Exercise Plan 5/10 towel roll down  spine, deep breathing; 5/11 self mobilization to  ribs and muscles (focused anteriorly for easy access); 5/19 foot mobility and self mobilization exercise; 6/1 self mob with tennis ball; 6/3 fascial stretches    Consulted and Agree with Plan of Care Patient           Patient will benefit from skilled therapeutic intervention in order to improve the following deficits and impairments:  Pain, Impaired UE functional use, Decreased activity tolerance, Impaired flexibility, Hypomobility  Visit Diagnosis: Acute pain of right shoulder  Acute pain of left shoulder  Other symptoms and signs involving the musculoskeletal system     Problem List Patient Active Problem List   Diagnosis Date Noted  . Palpitations 07/26/2011   10:46 AM, 09/30/19 Jerene Pitch, DPT Physical Therapy with [facility]  [phone] office [place] [address] [place], [place], [address] Phone: [phone]   Fax:  [phone]  Name: [name] MRN: [id number] Date of Birth: 09-13-1975

## 2019-10-04 MED FILL — FLUOXETINE HCL 10 MG TABS: 10 | 30 days supply | Qty: 30 | Fill #1

## 2019-10-05 ENCOUNTER — Ambulatory Visit (HOSPITAL_COMMUNITY): Payer: PRIVATE HEALTH INSURANCE | Admitting: Physical Therapy

## 2019-10-05 ENCOUNTER — Other Ambulatory Visit: Payer: Self-pay

## 2019-10-05 DIAGNOSIS — R29898 Other symptoms and signs involving the musculoskeletal system: Secondary | ICD-10-CM

## 2019-10-05 DIAGNOSIS — M25511 Pain in right shoulder: Secondary | ICD-10-CM

## 2019-10-05 DIAGNOSIS — M25512 Pain in left shoulder: Secondary | ICD-10-CM

## 2019-10-05 NOTE — Therapy (Signed)
[place], [place], [address] Phone: [phone]   Fax:  [phone]  Physical Therapy Treatment  Patient Details  Name: [name] MRN: [id number] Date of Birth: Jul 27, 1975 Referring Provider (PT): Sydnee Cabal   Encounter Date: 10/05/2019   PT End of Session - 10/05/19 1134    Visit Number 13    Number of Visits 22    Date for PT Re-Evaluation 11/02/19   eval 08/23/19   Authorization Type worker's compensation, 8 additional visits approved on top of original 12 (4 left over from OT), additional 12 visits approved from workers comp    Authorization - Visit Number 13    Authorization - Number of Visits 24    Progress Note Due on Visit 21    PT Start Time 1130    PT Stop Time 1210    PT Time Calculation (min) 40 min    Activity Tolerance Patient tolerated treatment well;Patient limited by pain    Behavior During Therapy [facility] for tasks assessed/performed           Past Medical History:  Diagnosis Date  . Back pain   . GERD (gastroesophageal reflux disease)   . Palpitations   . PONV (postoperative nausea and vomiting)   . Spinal headache     Past Surgical History:  Procedure Laterality Date  . CESAREAN SECTION    . Left knee arthroscopic surgery    . WISDOM TOOTH EXTRACTION      There were no vitals filed for this visit.   Subjective Assessment - 10/05/19 1131    Subjective Patient reports that she has not having as much tightness or pain and has been able to manage symptoms with self mobilizations.    Pertinent History DB    Limitations Lifting;House hold activities    Patient Stated Goals to be able to return to regular duty at work    Currently in Pain? Yes    Pain Score 2     Pain Location Shoulder    Pain Orientation Right    Pain Descriptors / Indicators Aching    Pain Onset More than a month ago              [facility] PT Assessment - 10/05/19 0001      Assessment   Medical  Diagnosis bilateral shoulder pain    Referring Provider (PT) Sydnee Cabal    Onset Date/Surgical Date 05/23/19    Hand Dominance Right                         [facility] Adult PT Treatment/Exercise - 10/05/19 0001      Therapeutic Activites    Therapeutic Activities Work Simulation    Work Therapist, sports 8# weight out in front of her for 30 seconds x4times - breaks in between reps; clips to chest height pole and 1# ankle weight to wrists - clipping 5 clips on pole while holding left arm out in front to mimic attaching IV pump to IV pole' changed to 2.5# ankle weights 3x5 reps      Neck Exercises: Standing   Other Standing Exercises self mobilization with tennis ball to posterior musculature - 8 minutes                    PT Short Term Goals - 09/28/19 1009      PT SHORT TERM GOAL #1   Title Patient will report  at least 50% improvement in overall symptoms and/or functional ability.    Baseline 75% better    Time 3    Period Weeks    Status Achieved    Target Date 09/13/19      PT SHORT TERM GOAL #2   Title Patient will be independent in self management strategies to improve quality of life and functional outcomes.    Baseline able to all without difficulties have bought traction device, tennis ball and foam roller    Time 3    Period Weeks    Status Achieved    Target Date 09/13/19             PT Long Term Goals - 09/28/19 1010      PT LONG TERM GOAL #1   Title Patient will report at least 75% improvement in overall symptoms and/or functional ability.    Baseline 75% better    Time 6    Period Weeks    Status Achieved      PT LONG TERM GOAL #2   Title Patient will score with <35% impairment on FOTO to demonstrate improved functional mobility    Baseline 66% function, 34% limitation    Time 6    Period Weeks    Status Achieved      PT LONG TERM GOAL #3   Title Patient will be able to lift IV pump (approximately 10 pounds) to about chest  height and hold it there for 30 seconds while screwing it into place with contralateral arm to perform required work related tasks.    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    Time 5    Period Weeks    Status New    Target Date 11/02/19      PT LONG TERM GOAL #5   Title --    Time --    Period --    Status --    Target Date --                 Plan - 10/05/19 1204    Clinical Impression Statement Continued progression of work related tasks. Increased wrist weights to 2.5# each and then up to 8# for dumbbell. Minor discomfort and coolness noted in right shoulder after clip exercises. This was alleviated with self mobilization with tennis ball. Will continue to progress work related tasks as tolerated.    Personal Factors and Comorbidities Comorbidity 1;Comorbidity 2    Comorbidities DB    Examination-Activity Limitations Carry;Lift;Hygiene/Grooming    Examination-Participation Restrictions Cleaning;Other   work   Stability/Clinical Decision Making Stable/Uncomplicated    Rehab Potential Good    PT Frequency 2x / week    PT Duration Other (comment)   5 weeks   PT Treatment/Interventions ADLs/Self Care Home Management;Aquatic Therapy;Biofeedback;Cryotherapy;Electrical Stimulation;Moist Heat;Traction;Balance training;Therapeutic exercise;Therapeutic activities;Functional mobility training;Stair training;Gait training;DME Instruction;Neuromuscular re-education;Cognitive remediation;Patient/family education;Manual techniques;Dry needling;Passive range of motion;Joint Manipulations;Spinal Manipulations    PT Next Visit Plan perform work related tasks, (upper and lower), s/l rib expansion. lat lengthing, thoracic and rib  mobilizations (UPA and medial glides for ribs). DN as needed  PT Home Exercise Plan 5/10 towel roll down spine, deep breathing; 5/11 self mobilization to ribs and muscles (focused anteriorly for easy access); 5/19 foot mobility and self mobilization exercise; 6/1 self mob with tennis ball; 6/3 fascial stretches    Consulted and Agree with Plan of Care Patient           Patient will benefit from skilled therapeutic intervention in order to improve the following deficits and impairments:  Pain, Impaired UE functional use, Decreased activity tolerance, Impaired flexibility, Hypomobility  Visit Diagnosis: Acute pain of right shoulder  Acute pain of left shoulder  Other symptoms and signs involving the musculoskeletal system     Problem List Patient Active Problem List   Diagnosis Date Noted  . Palpitations 07/26/2011   12:14 PM, 10/05/19 Jerene Pitch, DPT Physical Therapy with [facility]  [phone] office  [place] [address] [place], [place], [address] Phone: [phone]   Fax:  [phone]  Name: [name] MRN: [id number] Date of Birth: [birth year]

## 2019-10-07 ENCOUNTER — Encounter (HOSPITAL_COMMUNITY): Payer: Self-pay | Admitting: Physical Therapy

## 2019-10-07 ENCOUNTER — Ambulatory Visit (HOSPITAL_COMMUNITY): Payer: PRIVATE HEALTH INSURANCE | Admitting: Physical Therapy

## 2019-10-07 ENCOUNTER — Other Ambulatory Visit: Payer: Self-pay

## 2019-10-07 DIAGNOSIS — M25511 Pain in right shoulder: Secondary | ICD-10-CM

## 2019-10-07 DIAGNOSIS — R29898 Other symptoms and signs involving the musculoskeletal system: Secondary | ICD-10-CM

## 2019-10-07 DIAGNOSIS — M25512 Pain in left shoulder: Secondary | ICD-10-CM

## 2019-10-07 NOTE — Therapy (Signed)
[name], [place], [address] Phone: [phone]   Fax:  [phone]  Physical Therapy Treatment  Patient Details  Name: [name] MRN: [id number] Date of Birth: [birth year] Referring Provider (PT): Sydnee Cabal   Encounter Date: 10/07/2019   PT End of Session - 10/07/19 1403    Visit Number 14    Number of Visits 22    Date for PT Re-Evaluation 11/02/19   eval 08/23/19   Authorization Type worker's compensation, 8 additional visits approved on top of original 12 (4 left over from OT), additional 12 visits approved from workers comp    Authorization - Visit Number 14    Authorization - Number of Visits 24    Progress Note Due on Visit 21    Activity Tolerance Patient tolerated treatment well;Patient limited by pain    Behavior During Therapy [facility] for tasks assessed/performed           Past Medical History:  Diagnosis Date  . Back pain   . GERD (gastroesophageal reflux disease)   . Palpitations   . PONV (postoperative nausea and vomiting)   . Spinal headache     Past Surgical History:  Procedure Laterality Date  . CESAREAN SECTION    . Left knee arthroscopic surgery    . WISDOM TOOTH EXTRACTION      There were no vitals filed for this visit.   Subjective Assessment - 10/07/19 1406    Subjective States she woke up yesterday and felt like she worked out a lot but had no pain, had minor discomfort in neck like a crick but it is gone today.    Pertinent History DB    Limitations Lifting;House hold activities    Patient Stated Goals to be able to return to regular duty at work    Pain Onset More than a month ago              [facility] PT Assessment - 10/07/19 0001      Assessment   Medical Diagnosis bilateral shoulder pain    Referring Provider (PT) Sydnee Cabal    Onset Date/Surgical Date 05/23/19    Hand Dominance Right                         [facility] Adult PT  Treatment/Exercise - 10/07/19 0001      Shoulder Exercises: Standing   Protraction Strengthening;Both;Theraband;15 reps   blue. 5" holds    Other Standing Exercises self mobilization with tennis ball in axillary region and UT -  throughout session ; lat pull down blue TB 3x10     Other Standing Exercises shoulder extension at wall isometrics x10 5" holds B; scare crows at wall 2x5 B; horizontal shoulder abd at walla nd 90 degrees shoulder abd - 2x10, 5" holds B                   PT Education - 10/07/19 1410    Education Details on UT and SCM muscles and how they can be overworked in functional movements.    Person(s) Educated Patient    Methods Explanation    Comprehension Verbalized understanding            PT Short Term Goals - 09/28/19 1009      PT SHORT TERM GOAL #1   Title Patient will report at least 50% improvement in overall symptoms and/or functional ability.    Baseline 75%  better    Time 3    Period Weeks    Status Achieved    Target Date 09/13/19      PT SHORT TERM GOAL #2   Title Patient will be independent in self management strategies to improve quality of life and functional outcomes.    Baseline able to all without difficulties have bought traction device, tennis ball and foam roller    Time 3    Period Weeks    Status Achieved    Target Date 09/13/19             PT Long Term Goals - 09/28/19 1010      PT LONG TERM GOAL #1   Title Patient will report at least 75% improvement in overall symptoms and/or functional ability.    Baseline 75% better    Time 6    Period Weeks    Status Achieved      PT LONG TERM GOAL #2   Title Patient will score with <35% impairment on FOTO to demonstrate improved functional mobility    Baseline 66% function, 34% limitation    Time 6    Period Weeks    Status Achieved      PT LONG TERM GOAL #3   Title Patient will be able to lift IV pump (approximately 10 pounds) to about chest height and hold it there for  30 seconds while screwing it into place with contralateral arm to perform required work related tasks.    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    Time 5    Period Weeks    Status New    Target Date 11/02/19      PT LONG TERM GOAL #5   Title --    Time --    Period --    Status --    Target Date --                 Plan - 10/07/19 1446    Clinical Impression Statement Focused on UE strengthening today and fatigue noted during and end of session. Mild right shoulder pain noted during exercises but patient performed self mobilization techniques and symptoms were abolished. Will continue to focus on strengthening to return to work without limitations.    Personal Factors and Comorbidities Comorbidity 1;Comorbidity 2    Comorbidities DB    Examination-Activity Limitations Carry;Lift;Hygiene/Grooming    Examination-Participation Restrictions Cleaning;Other   work   Stability/Clinical Decision Making Stable/Uncomplicated    Rehab Potential Good    PT Frequency 2x / week    PT Duration Other (comment)   5 weeks   PT Treatment/Interventions ADLs/Self Care Home Management;Aquatic Therapy;Biofeedback;Cryotherapy;Electrical Stimulation;Moist Heat;Traction;Balance training;Therapeutic exercise;Therapeutic activities;Functional mobility training;Stair training;Gait training;DME Instruction;Neuromuscular re-education;Cognitive remediation;Patient/family education;Manual techniques;Dry needling;Passive range of motion;Joint Manipulations;Spinal Manipulations    PT Next Visit Plan perform work related tasks, (upper and lower), UE strengthening and posterior chain strengthening. thoracic and rib mobilizations (UPA and medial glides for  ribs). DN as needed    PT Home Exercise Plan 5/10 towel roll down spine, deep breathing; 5/11 self mobilization to ribs and muscles (focused anteriorly  for easy access); 5/19 foot mobility and self mobilization exercise; 6/1 self mob with tennis ball; 6/3 fascial stretches    Consulted and Agree with Plan of Care Patient           Patient will benefit from skilled therapeutic intervention in order to improve the following deficits and impairments:  Pain, Impaired UE functional use, Decreased activity tolerance, Impaired flexibility, Hypomobility  Visit Diagnosis: Acute pain of right shoulder  Acute pain of left shoulder  Other symptoms and signs involving the musculoskeletal system     Problem List Patient Active Problem List   Diagnosis Date Noted  . Palpitations 07/26/2011   2:47 PM, 10/07/19 Jerene Pitch, DPT Physical Therapy with [facility]  [phone] office  [place] [address] [place], [place], [address] Phone: [phone]   Fax:  [phone]  Name: ANELIA CARRIVEAU MRN: [id number] Date of Birth: 11-Jan-1976

## 2019-10-12 ENCOUNTER — Other Ambulatory Visit: Payer: Self-pay

## 2019-10-12 ENCOUNTER — Encounter (HOSPITAL_COMMUNITY): Payer: Self-pay | Admitting: Physical Therapy

## 2019-10-12 ENCOUNTER — Ambulatory Visit (HOSPITAL_COMMUNITY): Payer: PRIVATE HEALTH INSURANCE | Attending: Specialist | Admitting: Physical Therapy

## 2019-10-12 DIAGNOSIS — M25512 Pain in left shoulder: Secondary | ICD-10-CM | POA: Insufficient documentation

## 2019-10-12 DIAGNOSIS — R29898 Other symptoms and signs involving the musculoskeletal system: Secondary | ICD-10-CM | POA: Diagnosis present

## 2019-10-12 DIAGNOSIS — M25511 Pain in right shoulder: Secondary | ICD-10-CM | POA: Diagnosis not present

## 2019-10-12 NOTE — Therapy (Signed)
[place], [place], [address] Phone: [phone]   Fax:  [phone]  Physical Therapy Treatment  Patient Details  Name: [name] MRN: [id number] Date of Birth: 03-24-1976 Referring Provider (PT): Sydnee Cabal   Encounter Date: 10/12/2019   PT End of Session - 10/12/19 1001    Visit Number 15    Number of Visits 22    Date for PT Re-Evaluation 11/02/19   eval 08/23/19   Authorization Type worker's compensation, 8 additional visits approved on top of original 12 (4 left over from OT), additional 12 visits approved from workers comp    Authorization - Visit Number 15    Authorization - Number of Visits 24    Progress Note Due on Visit 21    PT Start Time 1001    PT Stop Time 1041    PT Time Calculation (min) 40 min    Activity Tolerance Patient tolerated treatment well;Patient limited by pain    Behavior During Therapy [facility] for tasks assessed/performed           Past Medical History:  Diagnosis Date  . Back pain   . GERD (gastroesophageal reflux disease)   . Palpitations   . PONV (postoperative nausea and vomiting)   . Spinal headache     Past Surgical History:  Procedure Laterality Date  . CESAREAN SECTION    . Left knee arthroscopic surgery    . WISDOM TOOTH EXTRACTION      There were no vitals filed for this visit.   Subjective Assessment - 10/12/19 1005    Subjective States she is a little tight and achy today in both shoulders but she hasn't stretched anything this morning and has just worked the last three days.    Pertinent History DB    Limitations Lifting;House hold activities    Patient Stated Goals to be able to return to regular duty at work    Currently in Pain? Yes    Pain Score 2     Pain Location Shoulder    Pain Orientation Left;Right    Pain Descriptors / Indicators Aching    Pain Onset More than a month ago              [facility] PT Assessment - 10/12/19 0001       Assessment   Medical Diagnosis bilateral shoulder pain    Referring Provider (PT) Sydnee Cabal    Onset Date/Surgical Date 05/23/19    Hand Dominance Right    Next MD Visit 10/27/19                         [facility] Adult PT Treatment/Exercise - 10/12/19 0001      Shoulder Exercises: Seated   Other Seated Exercises thoracic extension - seated x10 10" holds       Shoulder Exercises: Standing   Other Standing Exercises at table thoracic extension - not tolerated well     Other Standing Exercises thread the needle - x5 10" holds B at wall ; self mobilization with tennsi ball and shoulder motion  B       Manual Therapy   Manual Therapy Joint mobilization;Soft tissue mobilization    Manual therapy comments manual therapy performed independently of other interventions    Joint Mobilization R GHJ  inferior glide - grade III    Soft tissue mobilization To right axillary region  PT Education - 10/12/19 1039    Education Details Educated patient in self management strategies to perform at work to decrease pain and symptoms.    Person(s) Educated Patient    Methods Explanation    Comprehension Verbalized understanding            PT Short Term Goals - 09/28/19 1009      PT SHORT TERM GOAL #1   Title Patient will report at least 50% improvement in overall symptoms and/or functional ability.    Baseline 75% better    Time 3    Period Weeks    Status Achieved    Target Date 09/13/19      PT SHORT TERM GOAL #2   Title Patient will be independent in self management strategies to improve quality of life and functional outcomes.    Baseline able to all without difficulties have bought traction device, tennis ball and foam roller    Time 3    Period Weeks    Status Achieved    Target Date 09/13/19             PT Long Term Goals - 09/28/19 1010      PT LONG TERM GOAL #1   Title Patient will report at least 75% improvement in overall symptoms  and/or functional ability.    Baseline 75% better    Time 6    Period Weeks    Status Achieved      PT LONG TERM GOAL #2   Title Patient will score with <35% impairment on FOTO to demonstrate improved functional mobility    Baseline 66% function, 34% limitation    Time 6    Period Weeks    Status Achieved      PT LONG TERM GOAL #3   Title Patient will be able to lift IV pump (approximately 10 pounds) to about chest height and hold it there for 30 seconds while screwing it into place with contralateral arm to perform required work related tasks.    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    Time 5    Period Weeks    Status New    Target Date 11/02/19      PT LONG TERM GOAL #5   Title --    Time --    Period --    Status --    Target Date --                 Plan - 10/12/19 1029    Clinical Impression Statement Session focused on educating patient in ways to decrease tightness/pain while at work. Tolerated session well and able to educate patient on different stretches to perform while at work. No pain or discomfort noted end of session, improved relaxation noted end of session.    Personal Factors and Comorbidities Comorbidity 1;Comorbidity 2    Comorbidities DB    Examination-Activity Limitations Carry;Lift;Hygiene/Grooming    Examination-Participation Restrictions Cleaning;Other   work   Stability/Clinical Decision Making Stable/Uncomplicated    Rehab Potential Good    PT Frequency 2x / week    PT Duration  Other (comment)   5 weeks   PT Treatment/Interventions ADLs/Self Care Home Management;Aquatic Therapy;Biofeedback;Cryotherapy;Electrical Stimulation;Moist Heat;Traction;Balance training;Therapeutic  exercise;Therapeutic activities;Functional mobility training;Stair training;Gait training;DME Instruction;Neuromuscular re-education;Cognitive remediation;Patient/family education;Manual techniques;Dry needling;Passive range of motion;Joint Manipulations;Spinal Manipulations    PT Next Visit Plan perform work related tasks, (upper and lower), UE strengthening and posterior chain strengthening. thoracic and rib mobilizations (UPA and medial glides for ribs). DN as needed    PT Home Exercise Plan 5/10 towel roll down spine, deep breathing; 5/11 self mobilization to ribs and muscles (focused anteriorly for easy access); 5/19 foot mobility and self mobilization exercise; 6/1 self mob with tennis ball; 6/3 fascial stretches; 6/29 thread needle, thoracic extension    Consulted and Agree with Plan of Care Patient           Patient will benefit from skilled therapeutic intervention in order to improve the following deficits and impairments:  Pain, Impaired UE functional use, Decreased activity tolerance, Impaired flexibility, Hypomobility  Visit Diagnosis: Acute pain of right shoulder  Acute pain of left shoulder  Other symptoms and signs involving the musculoskeletal system     Problem List Patient Active Problem List   Diagnosis Date Noted  . Palpitations 07/26/2011    12:42 PM, 10/12/19 Jerene Pitch, DPT Physical Therapy with [facility]  [phone] office  [place] [address] [place], [place], [address] Phone: [phone]   Fax:  [phone]  Name: PHAEDRA COLGATE MRN: [id number] Date of Birth: [birth year]

## 2019-10-13 DIAGNOSIS — Z30430 Encounter for insertion of intrauterine contraceptive device: Secondary | ICD-10-CM | POA: Diagnosis not present

## 2019-10-13 DIAGNOSIS — Z30431 Encounter for routine checking of intrauterine contraceptive device: Secondary | ICD-10-CM | POA: Diagnosis not present

## 2019-10-13 DIAGNOSIS — Z30432 Encounter for removal of intrauterine contraceptive device: Secondary | ICD-10-CM | POA: Diagnosis not present

## 2019-10-13 DIAGNOSIS — T8339XD Other mechanical complication of intrauterine contraceptive device, subsequent encounter: Secondary | ICD-10-CM | POA: Diagnosis not present

## 2019-10-14 ENCOUNTER — Ambulatory Visit (HOSPITAL_COMMUNITY): Payer: PRIVATE HEALTH INSURANCE | Attending: Specialist | Admitting: Physical Therapy

## 2019-10-14 ENCOUNTER — Encounter (HOSPITAL_COMMUNITY): Payer: Self-pay | Admitting: Physical Therapy

## 2019-10-14 ENCOUNTER — Other Ambulatory Visit: Payer: Self-pay

## 2019-10-14 DIAGNOSIS — M25512 Pain in left shoulder: Secondary | ICD-10-CM | POA: Insufficient documentation

## 2019-10-14 DIAGNOSIS — R29898 Other symptoms and signs involving the musculoskeletal system: Secondary | ICD-10-CM | POA: Diagnosis present

## 2019-10-14 DIAGNOSIS — M25511 Pain in right shoulder: Secondary | ICD-10-CM | POA: Diagnosis present

## 2019-10-14 NOTE — Therapy (Signed)
[place], [place], [address] Phone: [phone]   Fax:  [phone]  Physical Therapy Treatment  Patient Details  Name: [name] MRN: [id number] Date of Birth: Aug 10, 1975 Referring Provider (PT): Sydnee Cabal   Encounter Date: 10/14/2019   PT End of Session - 10/14/19 1306    Visit Number 16    Number of Visits 22    Date for PT Re-Evaluation 11/02/19   eval 08/23/19   Authorization Type worker's compensation, 8 additional visits approved on top of original 12 (4 left over from OT), additional 12 visits approved from workers comp    Authorization - Visit Number 16    Authorization - Number of Visits 24    Progress Note Due on Visit 21    PT Start Time 1301    PT Stop Time 1339    PT Time Calculation (min) 38 min    Activity Tolerance Patient tolerated treatment well;Patient limited by pain    Behavior During Therapy [facility] for tasks assessed/performed           Past Medical History:  Diagnosis Date  . Back pain   . GERD (gastroesophageal reflux disease)   . Palpitations   . PONV (postoperative nausea and vomiting)   . Spinal headache     Past Surgical History:  Procedure Laterality Date  . CESAREAN SECTION    . Left knee arthroscopic surgery    . WISDOM TOOTH EXTRACTION      There were no vitals filed for this visit.   Subjective Assessment - 10/14/19 1303    Subjective Patient denied any pain currently. Stated the biggest issue she is having is with doing things correctly to not cause pain.    Pertinent History DB    Limitations Lifting;House hold activities    Patient Stated Goals to be able to return to regular duty at work    Currently in Pain? No/denies                             [facility] Adult PT Treatment/Exercise - 10/14/19 0001      Shoulder Exercises: Seated   Other Seated Exercises thoracic extension - seated x10 10" holds       Shoulder Exercises: Standing     Other Standing Exercises Weighted red ball on the wall x10 each. Y lift-off x15. Cues for proper form. thread the needle - x5 10" holds B at wall ; self mobilization with tennsi ball and shoulder motion  B       Manual Therapy   Manual Therapy Joint mobilization    Manual therapy comments manual therapy performed independently of other interventions    Joint Mobilization LT and RT GHJ  inferior glide - grade III                    PT Short Term Goals - 09/28/19 1009      PT SHORT TERM GOAL #1   Title Patient will report at least 50% improvement in overall symptoms and/or functional ability.    Baseline 75% better    Time 3    Period Weeks    Status Achieved    Target Date 09/13/19      PT SHORT TERM GOAL #2   Title Patient will be independent in self management strategies to improve quality of life and functional outcomes.    Baseline able to all  without difficulties have bought traction device, tennis ball and foam roller    Time 3    Period Weeks    Status Achieved    Target Date 09/13/19             PT Long Term Goals - 09/28/19 1010      PT LONG TERM GOAL #1   Title Patient will report at least 75% improvement in overall symptoms and/or functional ability.    Baseline 75% better    Time 6    Period Weeks    Status Achieved      PT LONG TERM GOAL #2   Title Patient will score with <35% impairment on FOTO to demonstrate improved functional mobility    Baseline 66% function, 34% limitation    Time 6    Period Weeks    Status Achieved      PT LONG TERM GOAL #3   Title Patient will be able to lift IV pump (approximately 10 pounds) to about chest height and hold it there for 30 seconds while screwing it into place with contralateral arm to perform required work related tasks.    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    Time 5    Period Weeks    Status New    Target Date 11/02/19      PT LONG TERM GOAL #5   Title --    Time --    Period --    Status --    Target Date --                 Plan - 10/14/19 1344    Clinical Impression Statement Focused on posterior chain strengthening and postural strengthening this session. Added Y liftoff and ball up and down the wall for improved scapular stabilization. Ended session with GHJ mobilizations to improve mobility. Patient reported feeling "amazing" at the end of the session.    Personal Factors and Comorbidities Comorbidity 1;Comorbidity 2    Comorbidities DB    Examination-Activity Limitations Carry;Lift;Hygiene/Grooming    Examination-Participation Restrictions Cleaning;Other   work   Stability/Clinical Decision Making Stable/Uncomplicated    Rehab Potential Good    PT Frequency 2x / week    PT Duration Other (comment)   5 weeks   PT Treatment/Interventions ADLs/Self Care Home Management;Aquatic Therapy;Biofeedback;Cryotherapy;Electrical Stimulation;Moist Heat;Traction;Balance training;Therapeutic exercise;Therapeutic activities;Functional mobility training;Stair training;Gait training;DME Instruction;Neuromuscular re-education;Cognitive remediation;Patient/family education;Manual techniques;Dry needling;Passive range of motion;Joint Manipulations;Spinal Manipulations    PT Next Visit Plan perform work related tasks - Patient works in NICU, (upper and lower), UE strengthening and posterior chain strengthening. thoracic and rib mobilizations (UPA and medial glides for ribs). DN as needed    PT Home Exercise Plan 5/10 towel roll down spine, deep breathing; 5/11 self mobilization to ribs and muscles (focused anteriorly for easy access); 5/19 foot mobility and self mobilization exercise; 6/1 self mob with tennis ball; 6/3 fascial  stretches; 6/29 thread needle, thoracic extension    Consulted and Agree with Plan of Care Patient           Patient will benefit from skilled therapeutic intervention in  order to improve the following deficits and impairments:  Pain, Impaired UE functional use, Decreased activity tolerance, Impaired flexibility, Hypomobility  Visit Diagnosis: Acute pain of right shoulder  Acute pain of left shoulder  Other symptoms and signs involving the musculoskeletal system     Problem List Patient Active Problem List   Diagnosis Date Noted  . Palpitations 07/26/2011   Clarene Critchley PT, DPT 1:45 PM, 10/14/19 [place] [address] [place], [place], [address] Phone: [phone]   Fax:  [phone]  Name: [name] MRN: [id number] Date of Birth: 1975-09-24

## 2019-10-19 DIAGNOSIS — F419 Anxiety disorder, unspecified: Secondary | ICD-10-CM | POA: Diagnosis not present

## 2019-10-20 ENCOUNTER — Encounter (HOSPITAL_COMMUNITY): Payer: Self-pay | Admitting: Physical Therapy

## 2019-10-20 ENCOUNTER — Other Ambulatory Visit: Payer: Self-pay

## 2019-10-20 ENCOUNTER — Ambulatory Visit (HOSPITAL_COMMUNITY): Payer: PRIVATE HEALTH INSURANCE | Attending: Specialist | Admitting: Physical Therapy

## 2019-10-20 DIAGNOSIS — M25512 Pain in left shoulder: Secondary | ICD-10-CM | POA: Diagnosis present

## 2019-10-20 DIAGNOSIS — M25511 Pain in right shoulder: Secondary | ICD-10-CM | POA: Diagnosis not present

## 2019-10-20 DIAGNOSIS — R29898 Other symptoms and signs involving the musculoskeletal system: Secondary | ICD-10-CM | POA: Diagnosis present

## 2019-10-20 NOTE — Therapy (Signed)
[place], [place], [address] Phone: [phone]   Fax:  [phone]  Physical Therapy Treatment  Patient Details  Name: [name] MRN: [id number] Date of Birth: 1975-11-26 Referring Provider (PT): Sydnee Cabal   Encounter Date: 10/20/2019   PT End of Session - 10/20/19 1310    Visit Number 17    Number of Visits 22    Date for PT Re-Evaluation 11/02/19   eval 08/23/19   Authorization Type worker's compensation, 8 additional visits approved on top of original 12 (4 left over from OT), additional 12 visits approved from workers comp    Authorization - Visit Number 17    Authorization - Number of Visits 24    Progress Note Due on Visit 21    PT Start Time 1305    PT Stop Time 1343    PT Time Calculation (min) 38 min    Activity Tolerance Patient tolerated treatment well;Patient limited by pain    Behavior During Therapy [facility] for tasks assessed/performed           Past Medical History:  Diagnosis Date  . Back pain   . GERD (gastroesophageal reflux disease)   . Palpitations   . PONV (postoperative nausea and vomiting)   . Spinal headache     Past Surgical History:  Procedure Laterality Date  . CESAREAN SECTION    . Left knee arthroscopic surgery    . WISDOM TOOTH EXTRACTION      There were no vitals filed for this visit.   Subjective Assessment - 10/20/19 1305    Subjective Patient states she has a crook in her neck since she woke up today. She tried doing some of her exercises and STM with tennis ball with limited relief.    Pertinent History DB    Limitations Lifting;House hold activities    Patient Stated Goals to be able to return to regular duty at work    Currently in Pain? Yes    Pain Location Shoulder    Pain Orientation Left                             [facility] Adult PT Treatment/Exercise - 10/20/19 0001      Neck Exercises: Seated   Other Seated Exercise Upper  trap stretch 4x30 second holds      Shoulder Exercises: Standing   Other Standing Exercises horizonal abduction with shoulder at 90 degrees 2x10    Other Standing Exercises Weighted red ball on the wall bilateral in flexion and abduction 8 min with rest breaks      Manual Therapy   Manual Therapy Joint mobilization    Manual therapy comments manual therapy performed independently of other interventions    Soft tissue mobilization L upper trap and levator scapulae at superior/medial boarder of scapula                  PT Education - 10/20/19 1310    Education Details educated on continuing HEP and exercise mechanics    Person(s) Educated Patient    Methods Explanation;Demonstration    Comprehension Verbalized understanding;Returned demonstration            PT Short Term Goals - 09/28/19 1009      PT SHORT TERM GOAL #1   Title Patient will report at least 50% improvement in overall symptoms and/or functional ability.    Baseline 75% better  Time 3    Period Weeks    Status Achieved    Target Date 09/13/19      PT SHORT TERM GOAL #2   Title Patient will be independent in self management strategies to improve quality of life and functional outcomes.    Baseline able to all without difficulties have bought traction device, tennis ball and foam roller    Time 3    Period Weeks    Status Achieved    Target Date 09/13/19             PT Long Term Goals - 09/28/19 1010      PT LONG TERM GOAL #1   Title Patient will report at least 75% improvement in overall symptoms and/or functional ability.    Baseline 75% better    Time 6    Period Weeks    Status Achieved      PT LONG TERM GOAL #2   Title Patient will score with <35% impairment on FOTO to demonstrate improved functional mobility    Baseline 66% function, 34% limitation    Time 6    Period Weeks    Status Achieved      PT LONG TERM GOAL #3   Title Patient will be able to lift IV pump (approximately 10  pounds) to about chest height and hold it there for 30 seconds while screwing it into place with contralateral arm to perform required work related tasks.    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    Time 5    Period Weeks    Status New    Target Date 11/02/19      PT LONG TERM GOAL #5   Title --    Time --    Period --    Status --    Target Date --                 Plan - 10/20/19 1311    Clinical Impression Statement Patient has slight improvement in pain with upper trap stretch for acute increase in neck/shoulder symptom. Patient has hyperactive UT and levator scapulae which decreases with STM. She tolerates well with improvement in cervical mobility following. She is able to complete horizontal abduction exercise with good mechanics for emphasis on shoulder endurance and postural strengthening. Patient fatigues quickly with ball on wall exercise. Patient will benefit from skilled physical therapy in order to improve function and reduce impairment.    Personal Factors and Comorbidities Comorbidity 1;Comorbidity 2    Comorbidities DB    Examination-Activity Limitations Carry;Lift;Hygiene/Grooming    Examination-Participation Restrictions Cleaning;Other   work   Stability/Clinical Decision Making Stable/Uncomplicated    Rehab Potential Good    PT Frequency 2x / week    PT Duration Other (comment)   5 weeks   PT Treatment/Interventions ADLs/Self Care Home Management;Aquatic Therapy;Biofeedback;Cryotherapy;Electrical Stimulation;Moist Heat;Traction;Balance training;Therapeutic exercise;Therapeutic activities;Functional mobility training;Stair training;Gait training;DME Instruction;Neuromuscular re-education;Cognitive  remediation;Patient/family education;Manual techniques;Dry needling;Passive range of motion;Joint Manipulations;Spinal Manipulations    PT Next Visit Plan 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 thoracic and rib mobilizations (UPA and medial glides for ribs). DN as needed    PT Home Exercise Plan 5/10 towel roll down spine, deep breathing; 5/11 self mobilization to ribs and muscles (focused anteriorly for easy access); 5/19 foot mobility and self mobilization exercise; 6/1 self mob with tennis ball; 6/3 fascial stretches; 6/29 thread needle, thoracic extension 7/7 horizontal abduction with resistance    Consulted and Agree with Plan of Care Patient           Patient will benefit from skilled therapeutic intervention in order to improve the following deficits and impairments:  Pain, Impaired UE functional use, Decreased activity tolerance, Impaired flexibility, Hypomobility  Visit Diagnosis: Acute pain of right shoulder  Acute pain of left shoulder  Other symptoms and signs involving the musculoskeletal system     Problem List Patient Active Problem List   Diagnosis Date Noted  . Palpitations 07/26/2011    1:44 PM, 10/20/19 Mearl Latin PT, DPT Physical Therapist at [place], [place], [address] Phone: [phone]   Fax:  [phone]  Name: [name] MRN: [id number] Date of Birth: 01/17/1976

## 2019-10-20 NOTE — Patient Instructions (Signed)
Access Code: [email or web address] URL: [email or web address] Date: 10/20/2019 Prepared by: [facility] Dyanara Cozza  Exercises Standing Shoulder Horizontal Abduction with Resistance - 1 x daily - 7 x weekly - 2 sets - 10 reps

## 2019-10-27 ENCOUNTER — Other Ambulatory Visit: Payer: Self-pay

## 2019-10-27 ENCOUNTER — Encounter (HOSPITAL_COMMUNITY): Payer: Self-pay | Admitting: Physical Therapy

## 2019-10-27 ENCOUNTER — Ambulatory Visit (HOSPITAL_COMMUNITY): Payer: PRIVATE HEALTH INSURANCE | Attending: Specialist | Admitting: Physical Therapy

## 2019-10-27 DIAGNOSIS — M25511 Pain in right shoulder: Secondary | ICD-10-CM | POA: Insufficient documentation

## 2019-10-27 DIAGNOSIS — R29898 Other symptoms and signs involving the musculoskeletal system: Secondary | ICD-10-CM | POA: Insufficient documentation

## 2019-10-27 DIAGNOSIS — M25512 Pain in left shoulder: Secondary | ICD-10-CM | POA: Diagnosis present

## 2019-10-27 NOTE — Therapy (Signed)
[facility] [address] [place], [place], [address] Phone: [phone]   Fax:  [phone]  Physical Therapy Treatment and Progress note  Patient Details  Name: [name] MRN: [id number] Date of Birth: Aug 19, 1975 Referring Provider (PT): Sydnee Cabal  Progress Note Reporting Period 09/28/19 to 10/27/19  See note below for Objective Data and Assessment of Progress/Goals.       Encounter Date: 10/27/2019   PT End of Session - 10/27/19 0836    Visit Number 18    Number of Visits 24    Date for PT Re-Evaluation 11/17/19   eval 08/23/19   Authorization Type worker's compensation, 8 additional visits approved on top of original 12 (4 left over from OT), additional 12 visits approved from workers Diplomatic Services operational officer - Visit Number 18    Authorization - Number of Visits 24    Progress Note Due on Visit 28    PT Start Time [phone]    PT Stop Time 0913    PT Time Calculation (min) 38 min    Activity Tolerance Patient tolerated treatment well;Patient limited by pain    Behavior During Therapy [facility] for tasks assessed/performed           Past Medical History:  Diagnosis Date  . Back pain   . GERD (gastroesophageal reflux disease)   . Palpitations   . PONV (postoperative nausea and vomiting)   . Spinal headache     Past Surgical History:  Procedure Laterality Date  . CESAREAN SECTION    . Left knee arthroscopic surgery    . WISDOM TOOTH EXTRACTION      There were no vitals filed for this visit.   Subjective Assessment - 10/27/19 1107    Subjective Patient reports she feels 85-90% better since the start of PT. States she feels she still has intermittent tightness/pain when she exerts herself.    Pertinent History DB    Limitations Lifting;House hold activities    Patient Stated Goals to be able to return to regular duty at work              [facility] PT Assessment - 10/27/19 0001      Assessment   Medical Diagnosis  bilateral shoulder pain    Referring Provider (PT) Sydnee Cabal    Onset Date/Surgical Date 05/23/19    Hand Dominance Right    Next MD Visit 10/27/19    Prior Therapy 0      Observation/Other Assessments   Focus on Therapeutic Outcomes (FOTO)  69% function    was 66% function     Functional Tests   Functional tests Other      Other:   Other/ Comments able to hold 15 pound for one minute without difficulties      Strength   Right Shoulder Flexion 5/5    Right Shoulder ABduction 5/5    Right Shoulder Internal Rotation 5/5    Right Shoulder External Rotation 4+/5    Left Shoulder Flexion 5/5    Left Shoulder ABduction 5/5    Left Shoulder Internal Rotation 5/5    Left Shoulder External Rotation 5/5                         [facility] Adult PT Treatment/Exercise - 10/27/19 0001      Manual Therapy   Manual Therapy Soft tissue mobilization    Manual therapy comments manual therapy performed independently of other  interventions    Soft tissue mobilization STM to left biceps, triceps and deltoid            Trigger Point Dry Needling - 10/27/19 0001    Consent Given? Yes    Education Handout Provided Previously provided    Muscles Treated Upper Quadrant Deltoid;Biceps    Dry Needling Comments 2 needles in left biceps, 3 needlies in left deltoid    Deltoid Response Palpable increased muscle length;Twitch response elicited    Biceps Response Palpable increased muscle length                PT Education - 10/27/19 1111    Education Details educated patient in current presentation, FOTO score, progress made, progress to make, reviewed HEP and answered all questions about return to work    [employer]) Educated Patient    Methods Explanation    Comprehension Verbalized understanding            PT Short Term Goals - 10/27/19 0852      PT SHORT TERM GOAL #1   Title Patient will report at least 50% improvement in overall symptoms and/or functional ability.      Baseline 75% better    Time 3    Period Weeks    Status Achieved    Target Date 09/13/19      PT SHORT TERM GOAL #2   Title Patient will be independent in self management strategies to improve quality of life and functional outcomes.    Baseline able to all without difficulties have bought traction device, tennis ball and foam roller    Time 3    Period Weeks    Status Achieved    Target Date 09/13/19             PT Long Term Goals - 10/27/19 1950      PT LONG TERM GOAL #1   Title Patient will report at least 75% improvement in overall symptoms and/or functional ability.    Baseline 75% better    Time 6    Period Weeks    Status Achieved      PT LONG TERM GOAL #2   Title Patient will score with <35% impairment on FOTO to demonstrate improved functional mobility    Baseline 66% function, 34% limitation    Time 6    Period Weeks    Status Achieved      PT LONG TERM GOAL #3   Title Patient will be able to lift IV pump (approximately 10 pounds) to about chest height and hold it there for 30 seconds while screwing it into place with contralateral arm to perform required work related tasks.    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    Time 5    Period Weeks    Status Achieved      PT LONG TERM GOAL #5   Title Patient will be able to work 12 hour shift at work on regular duty, with or without restrictions pending MD apt, and be able to self manage any flare up of symptoms to reduce incidence of regression in progress.    Time 3  Period Weeks    Status New    Target Date 11/17/19      Additional Long Term Goals   Additional Long Term Goals Yes      PT LONG TERM GOAL #6   Title Patient will report 95% improvement in overall  symptoms and/or function to return to prior level of function.    Time 3    Period Weeks    Status New    Target Date 11/17/19                 Plan - 10/27/19 0837    Clinical Impression Statement Patient has met all goals at this time. She is working towards returning to work but still has some difficulties with lifting and holding heavier items for longer periods of time. Patient has been able to demonstrate required work skills while in therapy, but not for length of time that a regular shift is. Extending POC to allow to work on management strategies with patient's anticipated return to work to decrease risk of reinjury. Session focused on education today.    Personal Factors and Comorbidities Comorbidity 1;Comorbidity 2    Comorbidities DB    Examination-Activity Limitations Carry;Lift;Hygiene/Grooming    Examination-Participation Restrictions Cleaning;Other   work   Stability/Clinical Decision Making Stable/Uncomplicated    Rehab Potential Good    PT Frequency 2x / week    PT Duration Other (comment)   5 weeks   PT Treatment/Interventions ADLs/Self Care Home Management;Aquatic Therapy;Biofeedback;Cryotherapy;Electrical Stimulation;Moist Heat;Traction;Balance training;Therapeutic exercise;Therapeutic activities;Functional mobility training;Stair training;Gait training;DME Instruction;Neuromuscular re-education;Cognitive remediation;Patient/family education;Manual techniques;Dry needling;Passive range of motion;Joint Manipulations;Spinal Manipulations    PT Next Visit Plan perform work related tasks - Patient works in NICU, (upper and lower), UE strengthening and posterior chain strengthening. thoracic and rib mobilizations (UPA and medial glides for ribs). DN as needed    PT Home Exercise Plan 5/10 towel roll down spine, deep breathing; 5/11 self mobilization to ribs and muscles (focused anteriorly for easy access); 5/19 foot mobility and self mobilization exercise; 6/1 self mob with  tennis ball; 6/3 fascial stretches; 6/29 thread needle, thoracic extension 7/7 horizontal abduction with resistance    Consulted and Agree with Plan of Care Patient           Patient will benefit from skilled therapeutic intervention in order to improve the following deficits and impairments:  Pain, Impaired UE functional use, Decreased activity tolerance, Impaired flexibility, Hypomobility  Visit Diagnosis: Acute pain of right shoulder  Acute pain of left shoulder  Other symptoms and signs involving the musculoskeletal system     Problem List Patient Active Problem List   Diagnosis Date Noted  . Palpitations 07/26/2011    11:13 AM, 10/27/19 Jerene Pitch, DPT Physical Therapy with [facility]  [phone] office   [facility] [address] [place], [place], [address] Phone: [phone]   Fax:  [phone]  Name: [name] MRN: [id number] Date of Birth: 04/27/1975

## 2019-10-28 ENCOUNTER — Ambulatory Visit (HOSPITAL_COMMUNITY): Payer: PRIVATE HEALTH INSURANCE | Attending: Specialist | Admitting: Physical Therapy

## 2019-10-28 ENCOUNTER — Encounter (HOSPITAL_COMMUNITY): Payer: Self-pay | Admitting: Physical Therapy

## 2019-10-28 DIAGNOSIS — R29898 Other symptoms and signs involving the musculoskeletal system: Secondary | ICD-10-CM | POA: Diagnosis present

## 2019-10-28 DIAGNOSIS — M25511 Pain in right shoulder: Secondary | ICD-10-CM | POA: Insufficient documentation

## 2019-10-28 DIAGNOSIS — M25512 Pain in left shoulder: Secondary | ICD-10-CM | POA: Insufficient documentation

## 2019-10-28 NOTE — Therapy (Signed)
[facility] [place], [place], [address] Phone: [phone]   Fax:  [phone]  Physical Therapy Treatment  Patient Details  Name: [name] MRN: [id number] Date of Birth: 1975/10/27 Referring Provider (PT): Sydnee Cabal   Encounter Date: 10/28/2019   PT End of Session - 10/28/19 1121    Visit Number 19    Number of Visits 24    Date for PT Re-Evaluation 11/17/19   eval 08/23/19   Authorization Type worker's compensation, 8 additional visits approved on top of original 12 (4 left over from OT), additional 12 visits approved from workers comp    Authorization - Visit Number 68    Authorization - Number of Visits 24    Progress Note Due on Visit 28    PT Start Time 1002    PT Stop Time 1042    PT Time Calculation (min) 40 min    Activity Tolerance Patient tolerated treatment well;Patient limited by pain    Behavior During Therapy [facility] for tasks assessed/performed           Past Medical History:  Diagnosis Date  . Back pain   . GERD (gastroesophageal reflux disease)   . Palpitations   . PONV (postoperative nausea and vomiting)   . Spinal headache     Past Surgical History:  Procedure Laterality Date  . CESAREAN SECTION    . Left knee arthroscopic surgery    . WISDOM TOOTH EXTRACTION      There were no vitals filed for this visit.   Subjective Assessment - 10/28/19 1004    Subjective MD apt rescehduled for monday. State she feels sore everywhere across her chest and back. States she has stillbeen stressed    Pertinent History DB    Limitations Lifting;House hold activities    Patient Stated Goals to be able to return to regular duty at work              [facility] PT Assessment - 10/28/19 0001      Assessment   Medical Diagnosis bilateral shoulder pain    Referring Provider (PT) Sydnee Cabal    Onset Date/Surgical Date 05/23/19    Hand Dominance Right    Next MD Visit 11/01/19                          [facility] Adult PT Treatment/Exercise - 10/28/19 0001      Shoulder Exercises: Supine   Other Supine Exercises lat stretch supine- elbows forward hands on hip - PT assist for ROM - 3x5 10" holds    Other Supine Exercises quadruped - scap protraction  with hands turned inward - 3x5 5" holds      Manual Therapy   Manual Therapy Joint mobilization;Soft tissue mobilization;Myofascial release    Manual therapy comments manual therapy performed independently of other interventions    Joint Mobilization rib mobilizations grade II/III - ribs 2-10 B UPA, CPA thoracic spine T2-8 grade II/III - tolerated well     Soft tissue mobilization STM to thoracic paraspinals and rhomboids    Myofascial Release cervical decompression at C7 - sustained holds 60 seconds - 4 bouts                    PT Short Term Goals - 10/27/19 2706      PT SHORT TERM GOAL #1   Title Patient will report at least 50% improvement in overall symptoms  and/or functional ability.    Baseline 75% better    Time 3    Period Weeks    Status Achieved    Target Date 09/13/19      PT SHORT TERM GOAL #2   Title Patient will be independent in self management strategies to improve quality of life and functional outcomes.    Baseline able to all without difficulties have bought traction device, tennis ball and foam roller    Time 3    Period Weeks    Status Achieved    Target Date 09/13/19             PT Long Term Goals - 10/27/19 6712      PT LONG TERM GOAL #1   Title Patient will report at least 75% improvement in overall symptoms and/or functional ability.    Baseline 75% better    Time 6    Period Weeks    Status Achieved      PT LONG TERM GOAL #2   Title Patient will score with <35% impairment on FOTO to demonstrate improved functional mobility    Baseline 66% function, 34% limitation    Time 6    Period Weeks    Status Achieved      PT LONG TERM GOAL #3   Title Patient will  be able to lift IV pump (approximately 10 pounds) to about chest height and hold it there for 30 seconds while screwing it into place with contralateral arm to perform required work related tasks.    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    Time 5    Period Weeks    Status Achieved      PT LONG TERM GOAL #5   Title Patient will be able to work 12 hour shift at work on regular duty, with or without restrictions pending MD apt, and be able to self manage any flare up of symptoms to reduce incidence of regression in progress.    Time 3    Period Weeks    Status New    Target Date 11/17/19      Additional Long Term Goals   Additional Long Term Goals Yes      PT LONG TERM GOAL #6   Title Patient will report 95% improvement in overall symptoms and/or function to return to prior level of function.    Time 3    Period Weeks    Status New    Target Date 11/17/19                 Plan - 10/28/19 1122    Clinical Impression Statement Patient tolerated session well reporting no soreness end of session. Tolerated manual work well. Reviewed home exercises and discussed lat stretch and foam roller exercises that may be beneficial. Will continue to progress work activities as tolerated.    Personal Factors and Comorbidities Comorbidity 1;Comorbidity 2    Comorbidities DB    Examination-Activity Limitations Carry;Lift;Hygiene/Grooming    Examination-Participation Restrictions Cleaning;Other   work   Stability/Clinical Decision Making Stable/Uncomplicated    Rehab Potential Good    PT Frequency 2x / week  PT Duration Other (comment)   5 weeks   PT Treatment/Interventions ADLs/Self Care Home Management;Aquatic  Therapy;Biofeedback;Cryotherapy;Electrical Stimulation;Moist Heat;Traction;Balance training;Therapeutic exercise;Therapeutic activities;Functional mobility training;Stair training;Gait training;DME Instruction;Neuromuscular re-education;Cognitive remediation;Patient/family education;Manual techniques;Dry needling;Passive range of motion;Joint Manipulations;Spinal Manipulations    PT Next Visit Plan perform work related tasks - Patient works in NICU, (upper and lower), UE strengthening and posterior chain strengthening. thoracic and rib mobilizations (UPA and medial glides for ribs). DN as needed    PT Home Exercise Plan 5/10 towel roll down spine, deep breathing; 5/11 self mobilization to ribs and muscles (focused anteriorly for easy access); 5/19 foot mobility and self mobilization exercise; 6/1 self mob with tennis ball; 6/3 fascial stretches; 6/29 thread needle, thoracic extension 7/7 horizontal abduction with resistance    Consulted and Agree with Plan of Care Patient           Patient will benefit from skilled therapeutic intervention in order to improve the following deficits and impairments:  Pain, Impaired UE functional use, Decreased activity tolerance, Impaired flexibility, Hypomobility  Visit Diagnosis: Acute pain of right shoulder  Acute pain of left shoulder  Other symptoms and signs involving the musculoskeletal system     Problem List Patient Active Problem List   Diagnosis Date Noted  . Palpitations 07/26/2011   11:24 AM, 10/28/19 Jerene Pitch, DPT Physical Therapy with [facility]  [phone] office  [facility] [address] [place], [place], [address] Phone: [phone]   Fax:  [phone]  Name: [name] MRN: [id number] Date of Birth: 1975-07-18

## 2019-11-02 ENCOUNTER — Other Ambulatory Visit: Payer: Self-pay

## 2019-11-02 ENCOUNTER — Encounter (HOSPITAL_COMMUNITY): Payer: Self-pay | Admitting: Physical Therapy

## 2019-11-02 ENCOUNTER — Ambulatory Visit (HOSPITAL_COMMUNITY): Payer: PRIVATE HEALTH INSURANCE | Attending: Specialist | Admitting: Physical Therapy

## 2019-11-02 DIAGNOSIS — M25511 Pain in right shoulder: Secondary | ICD-10-CM | POA: Insufficient documentation

## 2019-11-02 DIAGNOSIS — M25512 Pain in left shoulder: Secondary | ICD-10-CM | POA: Insufficient documentation

## 2019-11-02 DIAGNOSIS — R29898 Other symptoms and signs involving the musculoskeletal system: Secondary | ICD-10-CM | POA: Insufficient documentation

## 2019-11-02 MED FILL — FLUOXETINE HCL 10 MG TABS: 10 | 30 days supply | Qty: 30 | Fill #2

## 2019-11-02 NOTE — Therapy (Signed)
[place], [place], [address] Phone: [phone]   Fax:  [phone]  Physical Therapy Treatment  Patient Details  Name: [name] MRN: [id number] Date of Birth: [birth year] Referring Provider (PT): Sydnee Cabal   Encounter Date: 11/02/2019   PT End of Session - 11/02/19 1002    Visit Number 20    Number of Visits 24    Date for PT Re-Evaluation 11/17/19   eval 08/23/19   Authorization Type worker's compensation, 8 additional visits approved on top of original 12 (4 left over from OT), additional 12 visits approved from workers Diplomatic Services operational officer - Visit Number 7    Authorization - Number of Visits 24    Progress Note Due on Visit 28    PT Start Time 1002    PT Stop Time 1042    PT Time Calculation (min) 40 min    Activity Tolerance Patient tolerated treatment well;Patient limited by pain    Behavior During Therapy [facility] for tasks assessed/performed           Past Medical History:  Diagnosis Date   Back pain    GERD (gastroesophageal reflux disease)    Palpitations    PONV (postoperative nausea and vomiting)    Spinal headache     Past Surgical History:  Procedure Laterality Date   CESAREAN SECTION     Left knee arthroscopic surgery     WISDOM TOOTH EXTRACTION      There were no vitals filed for this visit.   Subjective Assessment - 11/02/19 1005    Subjective States her MD apt was canceled again. Her MD is apparently really sick. States her new apt is Wednesday.    Pertinent History DB    Limitations Lifting;House hold activities    Patient Stated Goals to be able to return to regular duty at work              [facility] PT Assessment - 11/02/19 0001      Assessment   Medical Diagnosis bilateral shoulder pain    Referring Provider (PT) Sydnee Cabal    Onset Date/Surgical Date 05/23/19    Hand Dominance Right    Next MD Visit 11/01/19                          [facility] Adult PT Treatment/Exercise - 11/02/19 0001      Shoulder Exercises: Standing   Other Standing Exercises horizonal abduction with shoulder at 90 degrees 2x10    Other Standing Exercises weighted ball red - shoulder flexion on wall - x4 1 minute each B       Manual Therapy   Manual Therapy Soft tissue mobilization    Manual therapy comments manual therapy performed independently of other interventions    Soft tissue mobilization STM to left deltoid, biceps and triceps             Trigger Point Dry Needling - 11/02/19 0001    Consent Given? Yes    Education Handout Provided Previously provided    Muscles Treated Upper Quadrant Deltoid;Biceps    Dry Needling Comments 3 needles in left biceps, 2 needlies in left deltoid    Deltoid Response Palpable increased muscle length;Twitch response elicited    Biceps Response Palpable increased muscle length                  PT Short Term Goals -  10/27/19 0852      PT SHORT TERM GOAL #1   Title Patient will report at least 50% improvement in overall symptoms and/or functional ability.    Baseline 75% better    Time 3    Period Weeks    Status Achieved    Target Date 09/13/19      PT SHORT TERM GOAL #2   Title Patient will be independent in self management strategies to improve quality of life and functional outcomes.    Baseline able to all without difficulties have bought traction device, tennis ball and foam roller    Time 3    Period Weeks    Status Achieved    Target Date 09/13/19             PT Long Term Goals - 10/27/19 1191      PT LONG TERM GOAL #1   Title Patient will report at least 75% improvement in overall symptoms and/or functional ability.    Baseline 75% better    Time 6    Period Weeks    Status Achieved      PT LONG TERM GOAL #2   Title Patient will score with <35% impairment on FOTO to demonstrate improved functional mobility    Baseline 66% function, 34%  limitation    Time 6    Period Weeks    Status Achieved      PT LONG TERM GOAL #3   Title Patient will be able to lift IV pump (approximately 10 pounds) to about chest height and hold it there for 30 seconds while screwing it into place with contralateral arm to perform required work related tasks.    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    Time 5    Period Weeks    Status Achieved      PT LONG TERM GOAL #5   Title Patient will be able to work 12 hour shift at work on regular duty, with or without restrictions pending MD apt, and be able to self manage any flare up of symptoms to reduce incidence of regression in progress.    Time 3    Period Weeks    Status New    Target Date 11/17/19      Additional Long Term Goals   Additional Long Term Goals Yes      PT LONG TERM GOAL #6   Title Patient will report 95% improvement in overall symptoms and/or function to return to prior level of function.    Time 3    Period Weeks    Status New    Target Date 11/17/19                 Plan - 11/02/19 1002    Clinical Impression Statement Focused on shoulder strengthening at shoulder height. Tolerated this well. Performed soft tissue mobilization and dry needling and patient tolerated this well. Reported no pain or soreness end of session.    Personal Factors and Comorbidities Comorbidity 1;Comorbidity 2    Comorbidities DB    Examination-Activity Limitations Carry;Lift;Hygiene/Grooming    Examination-Participation Restrictions Cleaning;Other   work   Stability/Clinical  Decision Making Stable/Uncomplicated    Rehab Potential Good    PT Frequency 2x / week    PT Duration Other (comment)   5 weeks   PT Treatment/Interventions ADLs/Self Care Home  Management;Aquatic Therapy;Biofeedback;Cryotherapy;Electrical Stimulation;Moist Heat;Traction;Balance training;Therapeutic exercise;Therapeutic activities;Functional mobility training;Stair training;Gait training;DME Instruction;Neuromuscular re-education;Cognitive remediation;Patient/family education;Manual techniques;Dry needling;Passive range of motion;Joint Manipulations;Spinal Manipulations    PT Next Visit Plan perform work related tasks - Patient works in NICU, (upper and lower), UE strengthening and posterior chain strengthening. thoracic and rib mobilizations (UPA and medial glides for ribs). DN as needed    PT Home Exercise Plan 5/10 towel roll down spine, deep breathing; 5/11 self mobilization to ribs and muscles (focused anteriorly for easy access); 5/19 foot mobility and self mobilization exercise; 6/1 self mob with tennis ball; 6/3 fascial stretches; 6/29 thread needle, thoracic extension 7/7 horizontal abduction with resistance    Consulted and Agree with Plan of Care Patient           Patient will benefit from skilled therapeutic intervention in order to improve the following deficits and impairments:  Pain, Impaired UE functional use, Decreased activity tolerance, Impaired flexibility, Hypomobility  Visit Diagnosis: Acute pain of right shoulder  Acute pain of left shoulder  Other symptoms and signs involving the musculoskeletal system     Problem List Patient Active Problem List   Diagnosis Date Noted   Palpitations 07/26/2011    10:41 AM, 11/02/19 Jerene Pitch, DPT Physical Therapy with [facility]  [phone] office  [place], [place], [address] Phone: [phone]   Fax:  [phone]  Name: JENSYN SHAVE MRN: [id number] Date of Birth: December 25, 1975

## 2019-11-04 ENCOUNTER — Ambulatory Visit (HOSPITAL_COMMUNITY): Payer: PRIVATE HEALTH INSURANCE | Attending: Specialist | Admitting: Physical Therapy

## 2019-11-04 ENCOUNTER — Other Ambulatory Visit: Payer: Self-pay

## 2019-11-04 DIAGNOSIS — M25512 Pain in left shoulder: Secondary | ICD-10-CM | POA: Insufficient documentation

## 2019-11-04 DIAGNOSIS — M25511 Pain in right shoulder: Secondary | ICD-10-CM | POA: Diagnosis not present

## 2019-11-04 DIAGNOSIS — R29898 Other symptoms and signs involving the musculoskeletal system: Secondary | ICD-10-CM | POA: Diagnosis present

## 2019-11-04 NOTE — Therapy (Signed)
[facility] [address] [place], [place], [address] Phone: [phone]   Fax:  [phone]  Physical Therapy Treatment  Patient Details  Name: [name] MRN: [id number] Date of Birth: Aug 11, 1975 Referring Provider (PT): Sydnee Cabal   Encounter Date: 11/04/2019   PT End of Session - 11/04/19 1006    Visit Number 21    Number of Visits 24    Date for PT Re-Evaluation 11/17/19   eval 08/23/19   Authorization Type worker's compensation, 8 additional visits approved on top of original 12 (4 left over from OT), additional 12 visits approved from workers comp    Authorization - Visit Number 21    Authorization - Number of Visits 24    Progress Note Due on Visit 28    PT Start Time 1002    PT Stop Time 1042    PT Time Calculation (min) 40 min    Activity Tolerance Patient tolerated treatment well;Patient limited by pain    Behavior During Therapy [facility] for tasks assessed/performed           Past Medical History:  Diagnosis Date  . Back pain   . GERD (gastroesophageal reflux disease)   . Palpitations   . PONV (postoperative nausea and vomiting)   . Spinal headache     Past Surgical History:  Procedure Laterality Date  . CESAREAN SECTION    . Left knee arthroscopic surgery    . WISDOM TOOTH EXTRACTION      There were no vitals filed for this visit.   Subjective Assessment - 11/04/19 1011    Subjective States she is just achy from chest up states MD cleared her to return to work at full duty and first shift will be Saturday.    Pertinent History DB    Limitations Lifting;House hold activities    Patient Stated Goals to be able to return to regular duty at work    Currently in Pain? No/denies              [facility] PT Assessment - 11/04/19 0001      Assessment   Medical Diagnosis bilateral shoulder pain                         [facility] Adult PT Treatment/Exercise - 11/04/19 0001      Therapeutic  Activites    Therapeutic Activities Work Simulation    Work Simulation twisting motion at chest height with rolling pin and 2# ankle weights on hands - 5x 45", Folding towel and placing on shelf overhead with 2# ankle weights on wrists 3x10      Shoulder Exercises: Standing   Other Standing Exercises lat stretch at wall x5 15" holds B     Other Standing Exercises self massage to left UE - deltoid/biceps adn triceps - with tennis ball - 6 minutes                  PT Education - 11/04/19 1035    Education Details on what to do wiht symptoms, concerns with return with return to work, answered all questions    Person(s) Educated Patient    Methods Explanation    Comprehension Verbalized understanding            PT Short Term Goals - 10/27/19 0852      PT SHORT TERM GOAL #1   Title Patient will report at least 50% improvement in overall symptoms and/or  functional ability.    Baseline 75% better    Time 3    Period Weeks    Status Achieved    Target Date 09/13/19      PT SHORT TERM GOAL #2   Title Patient will be independent in self management strategies to improve quality of life and functional outcomes.    Baseline able to all without difficulties have bought traction device, tennis ball and foam roller    Time 3    Period Weeks    Status Achieved    Target Date 09/13/19             PT Long Term Goals - 10/27/19 9562      PT LONG TERM GOAL #1   Title Patient will report at least 75% improvement in overall symptoms and/or functional ability.    Baseline 75% better    Time 6    Period Weeks    Status Achieved      PT LONG TERM GOAL #2   Title Patient will score with <35% impairment on FOTO to demonstrate improved functional mobility    Baseline 66% function, 34% limitation    Time 6    Period Weeks    Status Achieved      PT LONG TERM GOAL #3   Title Patient will be able to lift IV pump (approximately 10 pounds) to about chest height and hold it there for  30 seconds while screwing it into place with contralateral arm to perform required work related tasks.    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    Time 5    Period Weeks    Status Achieved      PT LONG TERM GOAL #5   Title Patient will be able to work 12 hour shift at work on regular duty, with or without restrictions pending MD apt, and be able to self manage any flare up of symptoms to reduce incidence of regression in progress.    Time 3    Period Weeks    Status New    Target Date 11/17/19      Additional Long Term Goals   Additional Long Term Goals Yes      PT LONG TERM GOAL #6   Title Patient will report 95% improvement in overall symptoms and/or function to return to prior level of function.    Time 3    Period Weeks    Status New    Target Date 11/17/19                 Plan - 11/04/19 1038    Clinical Impression Statement Patient tolerated session well today. Focused on functional tasks to improve ability to return to work. Educated patient in things to do at work if symptoms present. No increase in symptoms noted end of session.    Personal Factors and Comorbidities Comorbidity 1;Comorbidity 2    Comorbidities DB    Examination-Activity Limitations Carry;Lift;Hygiene/Grooming    Examination-Participation Restrictions Cleaning;Other   work   Stability/Clinical Decision Making Stable/Uncomplicated    Rehab Potential Good    PT Frequency 2x / week    PT  Duration Other (comment)   5 weeks   PT Treatment/Interventions ADLs/Self Care Home Management;Aquatic Therapy;Biofeedback;Cryotherapy;Electrical Stimulation;Moist Heat;Traction;Balance training;Therapeutic exercise;Therapeutic activities;Functional mobility training;Stair  training;Gait training;DME Instruction;Neuromuscular re-education;Cognitive remediation;Patient/family education;Manual techniques;Dry needling;Passive range of motion;Joint Manipulations;Spinal Manipulations    PT Next Visit Plan f/u with return to work. 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 thoracic and rib mobilizations (UPA and medial glides for ribs). DN as needed    PT Home Exercise Plan 5/10 towel roll down spine, deep breathing; 5/11 self mobilization to ribs and muscles (focused anteriorly for easy access); 5/19 foot mobility and self mobilization exercise; 6/1 self mob with tennis ball; 6/3 fascial stretches; 6/29 thread needle, thoracic extension 7/7 horizontal abduction with resistance    Consulted and Agree with Plan of Care Patient           Patient will benefit from skilled therapeutic intervention in order to improve the following deficits and impairments:  Pain, Impaired UE functional use, Decreased activity tolerance, Impaired flexibility, Hypomobility  Visit Diagnosis: Acute pain of right shoulder  Acute pain of left shoulder  Other symptoms and signs involving the musculoskeletal system     Problem List Patient Active Problem List   Diagnosis Date Noted  . Palpitations 07/26/2011   10:44 AM, 11/04/19 Jerene Pitch, DPT Physical Therapy with [facility]  [phone] office  [place] [address] [place], [place], [address] Phone: [phone]   Fax:  [phone]  Name: [name] MRN: [id number] Date of Birth: [birth year]

## 2019-11-09 ENCOUNTER — Other Ambulatory Visit: Payer: Self-pay

## 2019-11-09 ENCOUNTER — Ambulatory Visit (HOSPITAL_COMMUNITY): Payer: PRIVATE HEALTH INSURANCE | Admitting: Physical Therapy

## 2019-11-09 ENCOUNTER — Encounter (HOSPITAL_COMMUNITY): Payer: Self-pay | Admitting: Physical Therapy

## 2019-11-09 DIAGNOSIS — R29898 Other symptoms and signs involving the musculoskeletal system: Secondary | ICD-10-CM

## 2019-11-09 DIAGNOSIS — M25511 Pain in right shoulder: Secondary | ICD-10-CM | POA: Diagnosis not present

## 2019-11-09 DIAGNOSIS — M25512 Pain in left shoulder: Secondary | ICD-10-CM

## 2019-11-09 NOTE — Therapy (Signed)
[facility] [address] [place], [place], [address] Phone: [phone]   Fax:  [phone]  Physical Therapy Treatment  Patient Details  Name: [name] MRN: [id number] Date of Birth: 1976/02/02 Referring Provider (PT): Sydnee Cabal   Encounter Date: 11/09/2019   PT End of Session - 11/09/19 1003    Visit Number 22    Number of Visits 24    Date for PT Re-Evaluation 11/17/19   eval 08/23/19   Authorization Type worker's compensation, 8 additional visits approved on top of original 12 (4 left over from OT), additional 12 visits approved from workers comp    Authorization - Visit Number 85    Authorization - Number of Visits 24    Progress Note Due on Visit 28    PT Start Time 1002    PT Stop Time 1042    PT Time Calculation (min) 40 min    Activity Tolerance Patient tolerated treatment well;Patient limited by pain    Behavior During Therapy [facility] for tasks assessed/performed           Past Medical History:  Diagnosis Date  . Back pain   . GERD (gastroesophageal reflux disease)   . Palpitations   . PONV (postoperative nausea and vomiting)   . Spinal headache     Past Surgical History:  Procedure Laterality Date  . CESAREAN SECTION    . Left knee arthroscopic surgery    . WISDOM TOOTH EXTRACTION      There were no vitals filed for this visit.   Subjective Assessment - 11/09/19 1005    Subjective States she is really tired today after working all weekend. States that her shoulders did not bother her but she did have some minor back tightness but she was able to do her stretches and abolish her symptoms.    Pertinent History DB    Limitations Lifting;House hold activities    Patient Stated Goals to be able to return to regular duty at work    Currently in Pain? No/denies              [facility] PT Assessment - 11/09/19 0001      Assessment   Medical Diagnosis bilateral shoulder pain    Referring Provider (PT)  Sydnee Cabal    Onset Date/Surgical Date 05/23/19    Hand Dominance Right                         [facility] Adult PT Treatment/Exercise - 11/09/19 0001      Shoulder Exercises: Standing   Other Standing Exercises hip hinge education and practice x25 - prior demo    Other Standing Exercises glute activation in staggered stance = 10 minutes of practice; Standing TRA activation - focus on upper TRA - unable to perform in standing - performed with forceful breath out 3x10 5-10 seconds - with natural lumbar curve                   PT Education - 11/09/19 1016    Education Details educated patient in passive vs active posture - how exercises will help with current condition and improve overall function. oon muscle cramping and what to do if a cramp starts.    Person(s) Educated Patient    Methods Explanation    Comprehension Verbalized understanding            PT Short Term Goals - 10/27/19 6144  PT SHORT TERM GOAL #1   Title Patient will report at least 50% improvement in overall symptoms and/or functional ability.    Baseline 75% better    Time 3    Period Weeks    Status Achieved    Target Date 09/13/19      PT SHORT TERM GOAL #2   Title Patient will be independent in self management strategies to improve quality of life and functional outcomes.    Baseline able to all without difficulties have bought traction device, tennis ball and foam roller    Time 3    Period Weeks    Status Achieved    Target Date 09/13/19             PT Long Term Goals - 10/27/19 4166      PT LONG TERM GOAL #1   Title Patient will report at least 75% improvement in overall symptoms and/or functional ability.    Baseline 75% better    Time 6    Period Weeks    Status Achieved      PT LONG TERM GOAL #2   Title Patient will score with <35% impairment on FOTO to demonstrate improved functional mobility    Baseline 66% function, 34% limitation    Time 6    Period  Weeks    Status Achieved      PT LONG TERM GOAL #3   Title Patient will be able to lift IV pump (approximately 10 pounds) to about chest height and hold it there for 30 seconds while screwing it into place with contralateral arm to perform required work related tasks.    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    Time 5    Period Weeks    Status Achieved      PT LONG TERM GOAL #5   Title Patient will be able to work 12 hour shift at work on regular duty, with or without restrictions pending MD apt, and be able to self manage any flare up of symptoms to reduce incidence of regression in progress.    Time 3    Period Weeks    Status New    Target Date 11/17/19      Additional Long Term Goals   Additional Long Term Goals Yes      PT LONG TERM GOAL #6   Title Patient will report 95% improvement in overall symptoms and/or function to return to prior level of function.    Time 3    Period Weeks    Status New    Target Date 11/17/19                 Plan - 11/09/19 1004    Clinical Impression Statement Assessed sustained positions held at work and focused on more active vs passive posture to reduce back pain and improve tolerance to work activities. Fatigue noted and mild back pain with glute activation exercise but able to cue patient to decrease lumbar compensations and pain was abolished. Will follow up with isometric holds next session as these are best for patient given work requirements.    Personal Factors and  Comorbidities Comorbidity 1;Comorbidity 2    Comorbidities DB    Examination-Activity Limitations Carry;Lift;Hygiene/Grooming    Examination-Participation Restrictions Cleaning;Other   work   Stability/Clinical Decision Making  Stable/Uncomplicated    Rehab Potential Good    PT Frequency 2x / week    PT Duration Other (comment)   5 weeks   PT Treatment/Interventions ADLs/Self Care Home Management;Aquatic Therapy;Biofeedback;Cryotherapy;Electrical Stimulation;Moist Heat;Traction;Balance training;Therapeutic exercise;Therapeutic activities;Functional mobility training;Stair training;Gait training;DME Instruction;Neuromuscular re-education;Cognitive remediation;Patient/family education;Manual techniques;Dry needling;Passive range of motion;Joint Manipulations;Spinal Manipulations    PT Next Visit Plan f/u with return to work. 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 thoracic and rib mobilizations (UPA and medial glides for ribs). DN as needed    PT Home Exercise Plan 5/10 towel roll down spine, deep breathing; 5/11 self mobilization to ribs and muscles (focused anteriorly for easy access); 5/19 foot mobility and self mobilization exercise; 6/1 self mob with tennis ball; 6/3 fascial stretches; 6/29 thread needle, thoracic extension 7/7 horizontal abduction with resistance    Consulted and Agree with Plan of Care Patient           Patient will benefit from skilled therapeutic intervention in order to improve the following deficits and impairments:  Pain, Impaired UE functional use, Decreased activity tolerance, Impaired flexibility, Hypomobility  Visit Diagnosis: Acute pain of right shoulder  Acute pain of left shoulder  Other symptoms and signs involving the musculoskeletal system     Problem List Patient Active Problem List   Diagnosis Date Noted  . Palpitations 07/26/2011    10:51 AM, 11/09/19 Jerene Pitch, DPT Physical Therapy with [facility]  [phone] office   [facility] [address] [place], [place], [address] Phone: [phone]   Fax:   [phone]  Name: HAN LYSNE MRN: [id number] Date of Birth: [birth year]

## 2019-11-11 ENCOUNTER — Encounter (HOSPITAL_COMMUNITY): Payer: Self-pay | Admitting: Physical Therapy

## 2019-11-11 ENCOUNTER — Ambulatory Visit (HOSPITAL_COMMUNITY): Payer: PRIVATE HEALTH INSURANCE | Attending: Specialist | Admitting: Physical Therapy

## 2019-11-11 ENCOUNTER — Other Ambulatory Visit: Payer: Self-pay

## 2019-11-11 DIAGNOSIS — M25511 Pain in right shoulder: Secondary | ICD-10-CM | POA: Insufficient documentation

## 2019-11-11 DIAGNOSIS — M25512 Pain in left shoulder: Secondary | ICD-10-CM | POA: Diagnosis present

## 2019-11-11 DIAGNOSIS — R29898 Other symptoms and signs involving the musculoskeletal system: Secondary | ICD-10-CM | POA: Insufficient documentation

## 2019-11-11 NOTE — Therapy (Signed)
[facility] [place], [place], [address] Phone: [phone]   Fax:  [phone]  Physical Therapy Treatment  Patient Details  Name: [name] MRN: [id number] Date of Birth: 04-14-1976 Referring Provider (PT): Sydnee Cabal   Encounter Date: 11/11/2019   PT End of Session - 11/11/19 1009    Visit Number 23    Number of Visits 24    Date for PT Re-Evaluation 11/17/19   eval 08/23/19   Authorization Type worker's compensation, 8 additional visits approved on top of original 12 (4 left over from OT), additional 12 visits approved from workers comp    Authorization - Visit Number 23    Authorization - Number of Visits 24    Progress Note Due on Visit 28    PT Start Time 1006    PT Stop Time 1045    PT Time Calculation (min) 39 min    Activity Tolerance Patient tolerated treatment well;Patient limited by pain    Behavior During Therapy [facility] for tasks assessed/performed           Past Medical History:  Diagnosis Date  . Back pain   . GERD (gastroesophageal reflux disease)   . Palpitations   . PONV (postoperative nausea and vomiting)   . Spinal headache     Past Surgical History:  Procedure Laterality Date  . CESAREAN SECTION    . Left knee arthroscopic surgery    . WISDOM TOOTH EXTRACTION      There were no vitals filed for this visit.   Subjective Assessment - 11/11/19 1010    Subjective states she is feeling good no pain or soreness. States she is going to [employer] to get new shoes and orthotics.    Pertinent History DB    Limitations Lifting;House hold activities    Patient Stated Goals to be able to return to regular duty at work    Currently in Pain? No/denies              [facility] PT Assessment - 11/11/19 0001      Assessment   Medical Diagnosis bilateral shoulder pain    Referring Provider (PT) Sydnee Cabal    Onset Date/Surgical Date 05/23/19    Hand Dominance Right                          [facility] Adult PT Treatment/Exercise - 11/11/19 0001      Neck Exercises: Standing   Other Standing Exercises self mobilization with tennis ball B - 5 minutes      Shoulder Exercises: Standing   Other Standing Exercises hip hinge education and practice with dowel and prior demo - 10 minutes     Other Standing Exercises glute activation in staggered stance = 10 minutes of practice; Standing TRA activation - focus on upper TRA - unable to perform in standing - performed with forceful breath out 3x10 5-10 seconds - with natural lumbar curve       Shoulder Exercises: Stretch   Other Shoulder Stretches lat stretch wiht box x10, 15"" holds                  PT Education - 11/11/19 1021    Education Details reviewed HEP and answered questions - educated in hip hinge motion and why beneficial    Person(s) Educated Patient    Methods Explanation    Comprehension Verbalized understanding  PT Short Term Goals - 10/27/19 9629      PT SHORT TERM GOAL #1   Title Patient will report at least 50% improvement in overall symptoms and/or functional ability.    Baseline 75% better    Time 3    Period Weeks    Status Achieved    Target Date 09/13/19      PT SHORT TERM GOAL #2   Title Patient will be independent in self management strategies to improve quality of life and functional outcomes.    Baseline able to all without difficulties have bought traction device, tennis ball and foam roller    Time 3    Period Weeks    Status Achieved    Target Date 09/13/19             PT Long Term Goals - 10/27/19 5284      PT LONG TERM GOAL #1   Title Patient will report at least 75% improvement in overall symptoms and/or functional ability.    Baseline 75% better    Time 6    Period Weeks    Status Achieved      PT LONG TERM GOAL #2   Title Patient will score with <35% impairment on FOTO to demonstrate improved functional mobility    Baseline 66%  function, 34% limitation    Time 6    Period Weeks    Status Achieved      PT LONG TERM GOAL #3   Title Patient will be able to lift IV pump (approximately 10 pounds) to about chest height and hold it there for 30 seconds while screwing it into place with contralateral arm to perform required work related tasks.    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    Time 5    Period Weeks    Status Achieved      PT LONG TERM GOAL #5   Title Patient will be able to work 12 hour shift at work on regular duty, with or without restrictions pending MD apt, and be able to self manage any flare up of symptoms to reduce incidence of regression in progress.    Time 3    Period Weeks    Status New    Target Date 11/17/19      Additional Long Term Goals   Additional Long Term Goals Yes      PT LONG TERM GOAL #6   Title Patient will report 95% improvement in overall symptoms and/or function to return to prior level of function.    Time 3    Period Weeks    Status New    Target Date 11/17/19                 Plan - 11/11/19 1009    Clinical Impression Statement Continued to focus on hip hinge movement and glute activation. Tolerated well. Shoulders slightly increased in tension after hip hinge motion with dowel. Self mobilization and stretches helped with this. Will continue to work on work tolerance.    Personal Factors and Comorbidities Comorbidity 1;Comorbidity 2    Comorbidities DB    Examination-Activity Limitations Carry;Lift;Hygiene/Grooming  Examination-Participation Restrictions Cleaning;Other   work   Stability/Clinical Decision Making Stable/Uncomplicated    Rehab Potential Good    PT Frequency 2x / week    PT Duration Other (comment)   5  weeks   PT Treatment/Interventions ADLs/Self Care Home Management;Aquatic Therapy;Biofeedback;Cryotherapy;Electrical Stimulation;Moist Heat;Traction;Balance training;Therapeutic exercise;Therapeutic activities;Functional mobility training;Stair training;Gait training;DME Instruction;Neuromuscular re-education;Cognitive remediation;Patient/family education;Manual techniques;Dry needling;Passive range of motion;Joint Manipulations;Spinal Manipulations    PT Next Visit Plan anticipate DC from PT next session. 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 thoracic and rib mobilizations (UPA and medial glides for ribs). DN as needed    PT Home Exercise Plan 5/10 towel roll down spine, deep breathing; 5/11 self mobilization to ribs and muscles (focused anteriorly for easy access); 5/19 foot mobility and self mobilization exercise; 6/1 self mob with tennis ball; 6/3 fascial stretches; 6/29 thread needle, thoracic extension 7/7 horizontal abduction with resistance    Consulted and Agree with Plan of Care Patient           Patient will benefit from skilled therapeutic intervention in order to improve the following deficits and impairments:  Pain, Impaired UE functional use, Decreased activity tolerance, Impaired flexibility, Hypomobility  Visit Diagnosis: Acute pain of right shoulder  Acute pain of left shoulder  Other symptoms and signs involving the musculoskeletal system     Problem List Patient Active Problem List   Diagnosis Date Noted  . Palpitations 07/26/2011    10:44 AM, 11/11/19 Jerene Pitch, DPT Physical Therapy with [facility]  [phone] office  [place] [address] [place], [place], [address] Phone: [phone]   Fax:  [phone]  Name: ETHELREDA SUKHU MRN: [id number] Date of Birth: 03-11-1976

## 2019-11-16 ENCOUNTER — Encounter (HOSPITAL_COMMUNITY): Payer: Self-pay | Admitting: Physical Therapy

## 2019-11-16 ENCOUNTER — Ambulatory Visit (HOSPITAL_COMMUNITY): Payer: PRIVATE HEALTH INSURANCE | Attending: Specialist | Admitting: Physical Therapy

## 2019-11-16 ENCOUNTER — Other Ambulatory Visit: Payer: Self-pay

## 2019-11-16 DIAGNOSIS — M25511 Pain in right shoulder: Secondary | ICD-10-CM | POA: Diagnosis not present

## 2019-11-16 DIAGNOSIS — R29898 Other symptoms and signs involving the musculoskeletal system: Secondary | ICD-10-CM | POA: Insufficient documentation

## 2019-11-16 DIAGNOSIS — M25512 Pain in left shoulder: Secondary | ICD-10-CM | POA: Diagnosis present

## 2019-11-16 NOTE — Therapy (Signed)
[facility] [address] [place], [place], [address] Phone: [phone]   Fax:  [phone]  Physical Therapy Treatment Discharge Note  Patient Details  Name: [name] MRN: [id number] Date of Birth: [birth year] Referring Provider (PT): Sydnee Cabal  PHYSICAL THERAPY DISCHARGE SUMMARY  Visits from Start of Care: 24  Current functional level related to goals / functional outcomes: All but one long term goal met    Remaining deficits: See below  Education / Equipment: See below Plan: Patient agrees to discharge.  Patient goals were partially met. Patient is being discharged due to meeting the stated rehab goals.  ?????         Encounter Date: 11/16/2019   PT End of Session - 11/16/19 0958    Visit Number 24    Number of Visits 24    Date for PT Re-Evaluation 11/17/19   eval 08/23/19   Authorization Type worker's compensation, 8 additional visits approved on top of original 12 (4 left over from OT), additional 12 visits approved from workers [employer]omatic [employer] operational officer - Visit Number 24    Authorization - Number of Visits 24    Progress Note Due on Visit 28    PT Stop Time 1000    Activity Tolerance Patient tolerated treatment well;Patient limited by pain    Behavior During Therapy [facility] for tasks assessed/performed           Past Medical History:  Diagnosis Date  . Back pain   . GERD (gastroesophageal reflux disease)   . Palpitations   . PONV (postoperative nausea and vomiting)   . Spinal headache     Past Surgical History:  Procedure Laterality Date  . CESAREAN SECTION    . Left knee arthroscopic surgery    . WISDOM TOOTH EXTRACTION      There were no vitals filed for this visit.   Subjective Assessment - 11/16/19 1007    Subjective States she feels 90% better. States last 10% is that she still gets some stiffness and tightness but she is able to manage her symptoms with her exercises.    Pertinent History DB     Limitations Lifting;House hold activities    Patient Stated Goals to be able to return to regular duty at work    Currently in Pain? No/denies              [facility] PT Assessment - 11/16/19 0001      Assessment   Medical Diagnosis bilateral shoulder pain    Referring Provider (PT) Sydnee Cabal    Onset Date/Surgical Date 05/23/19      Functional Tests   Functional tests Other      Other:   Other/ Comments able to hold 15 pound for one minute without difficulties      Other:   Other/Comments able to lift to shelf at chest height repeatedly for 2 minutes       AROM   Right Shoulder Flexion 180 Degrees    Right Shoulder ABduction 180 Degrees    Right Shoulder Internal Rotation 90 Degrees    Right Shoulder External Rotation 65 Degrees    Left Shoulder Flexion 180 Degrees    Left Shoulder ABduction 180 Degrees    Left Shoulder Internal Rotation 90 Degrees    Left Shoulder External Rotation 65 Degrees      Strength   Right Shoulder Flexion 5/5    Right Shoulder ABduction 5/5    Right Shoulder  Internal Rotation 5/5    Right Shoulder External Rotation 5/5    Left Shoulder Flexion 5/5    Left Shoulder ABduction 5/5    Left Shoulder Internal Rotation 5/5    Left Shoulder External Rotation 5/5                         [facility] Adult PT Treatment/Exercise - 11/16/19 0001      Shoulder Exercises: Standing   Other Standing Exercises self mobilization with tennis ball, percussion gun, smaller round object for more pin point release. -20 minutes                  PT Education - 11/16/19 1030    Education Details in current condition, progress, HEP and how percussion gun may be helpful for management of symptoms.    Person(s) Educated Patient    Methods Explanation    Comprehension Verbalized understanding            PT Short Term Goals - 10/27/19 7169      PT SHORT TERM GOAL #1   Title Patient will report at least 50% improvement in overall  symptoms and/or functional ability.    Baseline 75% better    Time 3    Period Weeks    Status Achieved    Target Date 09/13/19      PT SHORT TERM GOAL #2   Title Patient will be independent in self management strategies to improve quality of life and functional outcomes.    Baseline able to all without difficulties have bought traction device, tennis ball and foam roller    Time 3    Period Weeks    Status Achieved    Target Date 09/13/19             PT Long Term Goals - 11/16/19 1009      PT LONG TERM GOAL #1   Title Patient will report at least 75% improvement in overall symptoms and/or functional ability.    Baseline 75% better    Time 6    Period Weeks    Status Achieved      PT LONG TERM GOAL #2   Title Patient will score with <35% impairment on FOTO to demonstrate improved functional mobility    Baseline 66% function, 34% limitation    Time 6    Period Weeks    Status Achieved      PT LONG TERM GOAL #3   Title Patient will be able to lift IV pump (approximately 10 pounds) to about chest height and hold it there for 30 seconds while screwing it into place with contralateral arm to perform required work related tasks.    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    Time 5    Period Weeks    Status Achieved      PT LONG TERM GOAL #5   Title Patient will be able to work 12 hour shift at work on regular duty, with or without restrictions pending MD apt, and be able to self manage any flare up of symptoms to reduce incidence of regression in  progress.    Time 3    Period Weeks    Status Achieved      PT LONG TERM GOAL #6   Title Patient will report 95% improvement in overall symptoms and/or function to return to prior level of  function.    Baseline 90% better    Time 3    Period Weeks    Status Not Met                 Plan - 11/16/19 1036    Clinical Impression Statement All short term goals met and all but one long term goals met at this time. Reviewed HEP and answered all questions. Patient demonstrated self management strategies during today's session. ROM and strength WNL and able to perform all required functional tasks at work. Patient to discharge from PT to HEP t this time.    Personal Factors and Comorbidities Comorbidity 1;Comorbidity 2    Comorbidities DB    Examination-Activity Limitations Carry;Lift;Hygiene/Grooming    Examination-Participation Restrictions Cleaning;Other   work   Stability/Clinical Decision Making Stable/Uncomplicated    Rehab Potential Good    PT Frequency 2x / week    PT Duration Other (comment)   5 weeks   PT Treatment/Interventions ADLs/Self Care Home Management;Aquatic Therapy;Biofeedback;Cryotherapy;Electrical Stimulation;Moist Heat;Traction;Balance training;Therapeutic exercise;Therapeutic activities;Functional mobility training;Stair training;Gait training;DME Instruction;Neuromuscular re-education;Cognitive remediation;Patient/family education;Manual techniques;Dry needling;Passive range of motion;Joint Manipulations;Spinal Manipulations    PT Next Visit Plan DC to HEP from PT at this time    PT Home Exercise Plan 5/10 towel roll down spine, deep breathing; 5/11 self mobilization to ribs and muscles (focused anteriorly for easy access); 5/19 foot mobility and self mobilization exercise; 6/1 self mob with tennis ball; 6/3 fascial stretches; 6/29 thread needle, thoracic extension 7/7 horizontal abduction with resistance    Consulted and Agree with Plan of Care Patient           Patient will benefit from skilled therapeutic intervention in order to improve the following deficits and impairments:  Pain, Impaired UE functional use, Decreased activity tolerance, Impaired  flexibility, Hypomobility  Visit Diagnosis: Acute pain of right shoulder  Acute pain of left shoulder  Other symptoms and signs involving the musculoskeletal system     Problem List Patient Active Problem List   Diagnosis Date Noted  . Palpitations 07/26/2011    11:12 AM, 11/16/19 Jerene Pitch, DPT Physical Therapy with [facility]  [phone] office   [facility] [address] [place], [place], [address] Phone: [phone]   Fax:  [phone]  Name: [name] MRN: [id number] Date of Birth: [birth year]

## 2019-11-18 ENCOUNTER — Encounter (HOSPITAL_COMMUNITY): Payer: 59 | Admitting: Physical Therapy

## 2019-12-02 ENCOUNTER — Other Ambulatory Visit (HOSPITAL_COMMUNITY): Payer: Self-pay | Admitting: Obstetrics and Gynecology

## 2019-12-02 MED FILL — FLUOXETINE HCL 10 MG TABS: 10 | 90 days supply | Qty: 90 | Fill #0

## 2019-12-15 DIAGNOSIS — Z131 Encounter for screening for diabetes mellitus: Secondary | ICD-10-CM | POA: Diagnosis not present

## 2020-02-12 MED FILL — METFORMIN HCL 1000 MG TABS: 1000 | 90 days supply | Qty: 180 | Fill #2

## 2020-02-14 ENCOUNTER — Other Ambulatory Visit: Payer: Self-pay | Admitting: Emergency Medicine

## 2020-02-14 ENCOUNTER — Ambulatory Visit
Admission: EM | Admit: 2020-02-14 | Discharge: 2020-02-14 | Disposition: A | Discharge status: Home / Self Care | Payer: 59 | Attending: Emergency Medicine | Admitting: Emergency Medicine

## 2020-02-14 ENCOUNTER — Encounter: Payer: Self-pay | Admitting: Emergency Medicine

## 2020-02-14 DIAGNOSIS — M5442 Lumbago with sciatica, left side: Secondary | ICD-10-CM

## 2020-02-14 DIAGNOSIS — G8929 Other chronic pain: Secondary | ICD-10-CM | POA: Diagnosis not present

## 2020-02-14 MED ORDER — PREDNISONE 10 MG PO TABS: 20.0000 mg | ORAL_TABLET | Freq: Every day | ORAL | 0 refills | Status: DC

## 2020-02-14 MED ORDER — DEXAMETHASONE SODIUM PHOSPHATE 10 MG/ML IJ SOLN
10.0000 mg | Freq: Once | INTRAMUSCULAR | Status: AC
  Administered 2020-02-14: 10 mg via INTRAMUSCULAR

## 2020-02-14 MED ORDER — CYCLOBENZAPRINE HCL 5 MG PO TABS: 5.0000 mg | ORAL_TABLET | Freq: Three times a day (TID) | ORAL | 0 refills | Status: DC | PRN

## 2020-02-14 MED FILL — PANTOPRAZOLE SOD DR 40 MG T: 40 | 90 days supply | Qty: 90 | Fill #0

## 2020-02-14 MED FILL — CYCLOBENZAPRINE HCL 5 MG TA: 5 | 10 days supply | Qty: 30 | Fill #0

## 2020-02-14 MED FILL — predniSONE 10 MG TABS: 10 | 7 days supply | Qty: 15 | Fill #0

## 2020-02-14 NOTE — ED Provider Notes (Signed)
[facility]   [id number] 02/14/20 Arrival Time: 38   Chief Complaint  Patient presents with  . Back Pain     SUBJECTIVE: History from: patient.  [name] is a 44 y.o. female presents the urgent care with a complaint of chronic on acute left lower back pain that radiated to the left leg that started yesterday.  Denies any precipitating event, trauma or injury.  He localizes the pain to the left low back and left leg.  She describes the pain as constant and achy.  She has tried OTC medications without relief.  Her symptoms are made worse with ROM.  She denies similar symptoms in the past.  Denies chills, fever, nausea, paresthesia, confusion, LOC.  ROS: As per HPI.  All other pertinent ROS negative.      Past Medical History:  Diagnosis Date  . Back pain   . GERD (gastroesophageal reflux disease)   . Palpitations   . PONV (postoperative nausea and vomiting)   . Spinal headache    Past Surgical History:  Procedure Laterality Date  . CESAREAN SECTION    . Left knee arthroscopic surgery    . WISDOM TOOTH EXTRACTION     Allergies  Allergen Reactions  . Erythromycin Base    No current facility-administered medications on file prior to encounter.   Current Outpatient Medications on File Prior to Encounter  Medication Sig Dispense Refill  . azelastine (ASTELIN) 0.1 % nasal spray Place 1 spray into both nostrils 2 (two) times daily. Use in each nostril as directed 30 mL 0  . calcium-vitamin D (OSCAL WITH D) 500-200 MG-UNIT tablet Take 1 tablet by mouth.    . cholecalciferol (VITAMIN D3) 25 MCG (1000 UT) tablet Take 5,000 Units by mouth daily.    . fluticasone (FLONASE) 50 MCG/ACT nasal spray Place 2 sprays into both nostrils daily for 10 days. 16 g 0  . levonorgestrel (MIRENA) 20 MCG/24HR IUD 1 each by Intrauterine route once.    . metFORMIN (GLUCOPHAGE) 500 MG tablet 1,000 mg 2 (two) times daily with a meal.     . pantoprazole (PROTONIX) 40 MG tablet  pantoprazole 40 mg tablet,delayed release    . pseudoephedrine (SUDAFED) 60 MG tablet Take 60 mg by mouth every 4 (four) hours as needed for congestion.    . [DISCONTINUED] esomeprazole (NEXIUM) 40 MG capsule Take 40 mg by mouth daily before breakfast.    . [DISCONTINUED] metoprolol succinate (TOPROL-XL) 50 MG 24 hr tablet Take 25 mg by mouth daily. 1/2 tablet daily     Social History   Socioeconomic History  . Marital status: Divorced    Spouse name: Not on file  . Number of children: 2  . Years of education: Not on file  . Highest education level: Not on file  Occupational History    Employer: [employer]  Tobacco Use  . Smoking status: Former Smoker    Quit date: 08/03/2002    Years since quitting: 17.5  . Smokeless tobacco: Never Used  Substance and Sexual Activity  . Alcohol use: No  . Drug use: No  . Sexual activity: Not on file  Other Topics Concern  . Not on file  Social History Narrative  . Not on file   Social Determinants of Health   Financial Resource Strain:   . Difficulty of Paying Living Expenses: Not on file  Food Insecurity:   . Worried About Charity fundraiser in the Last Year: Not on file  .  Ran Out of Food in the Last Year: Not on file  Transportation Needs:   . Lack of Transportation (Medical): Not on file  . Lack of Transportation (Non-Medical): Not on file  Physical Activity:   . Days of Exercise per Week: Not on file  . Minutes of Exercise per Session: Not on file  Stress:   . Feeling of Stress : Not on file  Social Connections:   . Frequency of Communication with Friends and Family: Not on file  . Frequency of Social Gatherings with Friends and Family: Not on file  . Attends Religious Services: Not on file  . Active Member of Clubs or Organizations: Not on file  . Attends Archivist Meetings: Not on file  . Marital Status: Not on file  Intimate Partner Violence:   . Fear of Current or Ex-Partner: Not on file  . Emotionally  Abused: Not on file  . Physically Abused: Not on file  . Sexually Abused: Not on file   Family History  Problem Relation Age of Onset  . Hypertension Mother   . Stroke Mother   . Anesthesia problems Mother   . Anesthesia problems Sister   . Hypertension Father     OBJECTIVE:  Vitals:   02/14/20 1112  BP: 138/81  Pulse: 88  Resp: 16  Temp: 98.6 F (37 C)  SpO2: 98%     Physical Exam Vitals and nursing note reviewed.  Constitutional:      General: She is not in acute distress.    Appearance: Normal appearance. She is normal weight. She is not ill-appearing, toxic-appearing or diaphoretic.  HENT:     Head: Normocephalic.  Cardiovascular:     Rate and Rhythm: Normal rate and regular rhythm.     Pulses: Normal pulses.     Heart sounds: Normal heart sounds. No murmur heard.  No friction rub. No gallop.   Pulmonary:     Effort: Pulmonary effort is normal. No respiratory distress.     Breath sounds: Normal breath sounds. No stridor. No wheezing, rhonchi or rales.  Chest:     Chest wall: No tenderness.  Musculoskeletal:        General: Tenderness present.     Lumbar back: Spasms and tenderness present.     Comments: Back:  Patient ambulates from chair to exam table without difficulty.  Inspection: Skin clear and intact without obvious swelling, erythema, or ecchymosis. Warm to the touch  Palpation: Vertebral processes nontender. Tenderness about the lower left paravertebral muscles  ROM: FROM Strength: 5/5 hip flexion, 5/5 knee extension, 5/5 knee flexion, 5/5 plantar flexion, 5/5 dorsiflexion   Neurological:     Mental Status: She is alert and oriented to person, place, and time.      LABS:  No results found for this or any previous visit (from the past 24 hour(s)).   ASSESSMENT & PLAN:  1. Chronic left-sided low back pain with left-sided sciatica     Meds ordered this encounter  Medications  . dexamethasone (DECADRON) injection 10 mg  . predniSONE  (DELTASONE) 10 MG tablet    Sig: Take 2 tablets (20 mg total) by mouth daily.    Dispense:  15 tablet    Refill:  0  . cyclobenzaprine (FLEXERIL) 5 MG tablet    Sig: Take 1 tablet (5 mg total) by mouth 3 (three) times daily as needed.    Dispense:  30 tablet    Refill:  0   Discharge instructions  Rest,  ice and heat as needed Ensure adequate ROM as tolerated. Decadron IM was given in office  Continue to take Tylenol as needed for pain Prescribed prednisone for inflammation Prescribed flexeril  for muscle spasm.  Do not drive or operate heavy machinery while taking this medication Return here or go to ER if you have any new or worsening symptoms such as numbness/tingling of the inner thighs, loss of bladder or bowel control, headache/blurry vision, nausea/vomiting, confusion/altered mental status, dizziness, weakness, passing out, imbalance, etc...    Reviewed expectations re: course of current medical issues. Questions answered. Outlined signs and symptoms indicating need for more acute intervention. Patient verbalized understanding. After Visit Summary given.         Emerson Monte, FNP 02/14/20 1201

## 2020-02-14 NOTE — ED Triage Notes (Signed)
c/o lower back pain that radiates down into the left leg x yesterday- chronic pain sees an orthopedic for this

## 2020-02-14 NOTE — Discharge Instructions (Signed)
Rest, ice and heat as needed Ensure adequate ROM as tolerated. Decadron IM was given in office  Continue to take Tylenol as needed for pain Prescribed prednisone for inflammation Prescribed flexeril  for muscle spasm.  Do not drive or operate heavy machinery while taking this medication Return here or go to ER if you have any new or worsening symptoms such as numbness/tingling of the inner thighs, loss of bladder or bowel control, headache/blurry vision, nausea/vomiting, confusion/altered mental status, dizziness, weakness, passing out, imbalance, etc..Marland Kitchen

## 2020-02-17 ENCOUNTER — Other Ambulatory Visit (HOSPITAL_COMMUNITY): Payer: Self-pay | Admitting: Sports Medicine

## 2020-02-17 DIAGNOSIS — M5459 Other low back pain: Secondary | ICD-10-CM | POA: Diagnosis not present

## 2020-02-17 MED FILL — predniSONE 10 MG TABS: 10 | 6 days supply | Qty: 21 | Fill #0

## 2020-03-22 ENCOUNTER — Telehealth: Payer: 59 | Admitting: Family

## 2020-03-22 DIAGNOSIS — J069 Acute upper respiratory infection, unspecified: Secondary | ICD-10-CM | POA: Diagnosis not present

## 2020-03-22 MED ORDER — FLUTICASONE PROPIONATE 50 MCG/ACT NA SUSP: 2.0000 | Freq: Every day | NASAL | 6 refills | Status: DC

## 2020-03-22 NOTE — Progress Notes (Signed)
We are sorry you are not feeling well.  Here is how we plan to help!  Based on what you have shared with me, it looks like you may have a viral upper respiratory infection.  Upper respiratory infections are caused by a large number of viruses; however, rhinovirus is the most common cause.   I am sorry, we have strict criteria  for prescribing antibiotics for Evisits and you do not currently meed them.   Symptoms vary from person to person, with common symptoms including sore throat, cough, fatigue or lack of energy and feeling of general discomfort.  A low-grade fever of up to 100.4 may present, but is often uncommon.  Symptoms vary however, and are closely related to a person's age or underlying illnesses.  The most common symptoms associated with an upper respiratory infection are nasal discharge or congestion, cough, sneezing, headache and pressure in the ears and face.  These symptoms usually persist for about 3 to 10 days, but can last up to 2 weeks.  It is important to know that upper respiratory infections do not cause serious illness or complications in most cases.    Upper respiratory infections can be transmitted from person to person, with the most common method of transmission being a person's hands.  The virus is able to live on the skin and can infect other persons for up to 2 hours after direct contact.  Also, these can be transmitted when someone coughs or sneezes; thus, it is important to cover the mouth to reduce this risk.  To keep the spread of the illness at [place], good hand hygiene is very important.  This is an infection that is most likely caused by a virus. There are no specific treatments other than to help you with the symptoms until the infection runs its course.  We are sorry you are not feeling well.  Here is how we plan to help!   For nasal congestion, you may use an oral decongestants such as Mucinex D or if you have glaucoma or high blood pressure use plain Mucinex.   Saline nasal spray or nasal drops can help and can safely be used as often as needed for congestion.  For your congestion, I have prescribed Fluticasone nasal spray one spray in each nostril twice a day  If you do not have a history of heart disease, hypertension, diabetes or thyroid disease, prostate/bladder issues or glaucoma, you may also use Sudafed to treat nasal congestion.  It is highly recommended that you consult with a pharmacist or your primary care physician to ensure this medication is safe for you to take.     If you have a cough, you may use cough suppressants such as Delsym and Robitussin.  If you have glaucoma or high blood pressure, you can also use Coricidin HBP.    If you have a sore or scratchy throat, use a saltwater gargle-  to  teaspoon of salt dissolved in a 4-ounce to 8-ounce glass of warm water.  Gargle the solution for approximately 15-30 seconds and then spit.  It is important not to swallow the solution.  You can also use throat lozenges/cough drops and Chloraseptic spray to help with throat pain or discomfort.  Warm or cold liquids can also be helpful in relieving throat pain.  For headache, pain or general discomfort, you can use Ibuprofen or Tylenol as directed.   Some authorities believe that zinc sprays or the use of Echinacea may shorten the course of  your symptoms.   HOME CARE . Only take medications as instructed by your medical team. . Be sure to drink plenty of fluids. Water is fine as well as fruit juices, sodas and electrolyte beverages. You may want to stay away from caffeine or alcohol. If you are nauseated, try taking small sips of liquids. How do you know if you are getting enough fluid? Your urine should be a pale yellow or almost colorless. . Get rest. . Taking a steamy shower or using a humidifier may help nasal congestion and ease sore throat pain. You can place a towel over your head and breathe in the steam from hot water coming from a  faucet. . Using a saline nasal spray works much the same way. . Cough drops, hard candies and sore throat lozenges may ease your cough. . Avoid close contacts especially the very young and the elderly . Cover your mouth if you cough or sneeze . Always remember to wash your hands.   GET HELP RIGHT AWAY IF: . You develop worsening fever. . If your symptoms do not improve within 10 days . You develop yellow or green discharge from your nose over 3 days. . You have coughing fits . You develop a severe head ache or visual changes. . You develop shortness of breath, difficulty breathing or start having chest pain . Your symptoms persist after you have completed your treatment plan  MAKE SURE YOU   Understand these instructions.  Will watch your condition.  Will get help right away if you are not doing well or get worse.  Your e-visit answers were reviewed by a board certified advanced clinical practitioner to complete your personal care plan. Depending upon the condition, your plan could have included both over the counter or prescription medications. Please review your pharmacy choice. If there is a problem, you may call our nursing hot line at and have the prescription routed to another pharmacy. Your safety is important to [place]. If you have drug allergies check your prescription carefully.   You can use MyChart to ask questions about today's visit, request a non-urgent call back, or ask for a work or school excuse for 24 hours related to this e-Visit. If it has been greater than 24 hours you will need to follow up with your provider, or enter a new e-Visit to address those concerns. You will get an e-mail in the next two days asking about your experience.  I hope that your e-visit has been valuable and will speed your recovery. Thank you for using e-visits.   Approximately 5 minutes was spent documenting and reviewing patient's chart.

## 2020-03-23 MED ORDER — BENZONATATE 100 MG PO CAPS
100.0000 mg | ORAL_CAPSULE | Freq: Three times a day (TID) | ORAL | 0 refills | Status: DC | PRN
Start: 2020-03-23 — End: 2020-11-03

## 2020-03-23 NOTE — Addendum Note (Signed)
Addended by: Dorise Hiss on: 03/23/2020 02:40 PM   Modules accepted: Orders

## 2020-03-27 ENCOUNTER — Ambulatory Visit
Admission: RE | Admit: 2020-03-27 | Discharge: 2020-03-27 | Disposition: A | Discharge status: Home / Self Care | Payer: 59 | Source: Ambulatory Visit | Attending: Emergency Medicine | Admitting: Emergency Medicine

## 2020-03-27 ENCOUNTER — Other Ambulatory Visit: Payer: Self-pay

## 2020-03-27 VITALS — BP 127/87 | HR 95 | Temp 98.4°F | Resp 18

## 2020-03-27 DIAGNOSIS — J0101 Acute recurrent maxillary sinusitis: Secondary | ICD-10-CM | POA: Diagnosis not present

## 2020-03-27 MED ORDER — DEXAMETHASONE SODIUM PHOSPHATE 10 MG/ML IJ SOLN
10.0000 mg | Freq: Once | INTRAMUSCULAR | Status: AC
  Administered 2020-03-27: 13:00:00 10 mg via INTRAMUSCULAR

## 2020-03-27 MED ORDER — AMOXICILLIN-POT CLAVULANATE 875-125 MG PO TABS: 1.0000 | ORAL_TABLET | Freq: Two times a day (BID) | ORAL | 0 refills | Status: DC

## 2020-03-27 NOTE — Discharge Instructions (Addendum)
Get plenty of rest and push fluids Augmentin was prescribed  Continue to take Tessalon Perles for cough Continue to take Flonase as prescribed and directed Use medications daily for symptom relief Use OTC medications like ibuprofen or tylenol as needed fever or pain Call or go to the ED if you have any new or worsening symptoms such as fever, worsening cough, shortness of breath, chest tightness, chest pain, turning blue, changes in mental status, etc..Marland Kitchen

## 2020-03-27 NOTE — ED Provider Notes (Addendum)
[place]   [id number] 03/27/20 Arrival Time: 1109   Chief Complaint  Patient presents with  . Nasal Congestion     SUBJECTIVE: History from: patient.  [name] is a 44 y.o. female presented to the urgent care for complaint of nasal congestion, sinus pain, sinus pressure with green nasal discharge for the past 1 week..  Denies sick exposure to COVID, flu or strep.  Denies recent travel.  Has tried OTC medication without relief.  Symptoms are made worse with lying down.  Denies previous symptoms in the past.   Denies fever, chills, fatigue, sinus pain, rhinorrhea, sore throat, SOB, wheezing, chest pain, nausea, changes in bowel or bladder habits.       ROS: As per HPI.  All other pertinent ROS negative.      Past Medical History:  Diagnosis Date  . Back pain   . GERD (gastroesophageal reflux disease)   . Palpitations   . PONV (postoperative nausea and vomiting)   . Spinal headache    Past Surgical History:  Procedure Laterality Date  . CESAREAN SECTION    . Left knee arthroscopic surgery    . WISDOM TOOTH EXTRACTION     Allergies  Allergen Reactions  . Erythromycin Base    No current facility-administered medications on file prior to encounter.   Current Outpatient Medications on File Prior to Encounter  Medication Sig Dispense Refill  . azelastine (ASTELIN) 0.1 % nasal spray Place 1 spray into both nostrils 2 (two) times daily. Use in each nostril as directed 30 mL 0  . benzonatate (TESSALON) 100 MG capsule Take 1-2 capsules (100-200 mg total) by mouth 3 (three) times daily as needed for cough. 40 capsule 0  . calcium-vitamin D (OSCAL WITH D) 500-200 MG-UNIT tablet Take 1 tablet by mouth.    . cholecalciferol (VITAMIN D3) 25 MCG (1000 UT) tablet Take 5,000 Units by mouth daily.    . cyclobenzaprine (FLEXERIL) 5 MG tablet Take 1 tablet (5 mg total) by mouth 3 (three) times daily as needed. 30 tablet 0  . fluticasone (FLONASE) 50 MCG/ACT nasal spray  Place 2 sprays into both nostrils daily. 16 g 6  . levonorgestrel (MIRENA) 20 MCG/24HR IUD 1 each by Intrauterine route once.    . metFORMIN (GLUCOPHAGE) 500 MG tablet 1,000 mg 2 (two) times daily with a meal.     . pantoprazole (PROTONIX) 40 MG tablet pantoprazole 40 mg tablet,delayed release    . predniSONE (DELTASONE) 10 MG tablet Take 2 tablets (20 mg total) by mouth daily. 15 tablet 0  . pseudoephedrine (SUDAFED) 60 MG tablet Take 60 mg by mouth every 4 (four) hours as needed for congestion.    . [DISCONTINUED] esomeprazole (NEXIUM) 40 MG capsule Take 40 mg by mouth daily before breakfast.    . [DISCONTINUED] metoprolol succinate (TOPROL-XL) 50 MG 24 hr tablet Take 25 mg by mouth daily. 1/2 tablet daily     Social History   Socioeconomic History  . Marital status: Divorced    Spouse name: Not on file  . Number of children: 2  . Years of education: Not on file  . Highest education level: Not on file  Occupational History    Employer: [employer]  Tobacco Use  . Smoking status: Former Smoker    Quit date: 08/03/2002    Years since quitting: 17.6  . Smokeless tobacco: Never Used  Substance and Sexual Activity  . Alcohol use: No  . Drug use: No  . Sexual  activity: Not on file  Other Topics Concern  . Not on file  Social History Narrative  . Not on file   Social Determinants of Health   Financial Resource Strain: Not on file  Food Insecurity: Not on file  Transportation Needs: Not on file  Physical Activity: Not on file  Stress: Not on file  Social Connections: Not on file  Intimate Partner Violence: Not on file   Family History  Problem Relation Age of Onset  . Hypertension Mother   . Stroke Mother   . Anesthesia problems Mother   . Anesthesia problems Sister   . Hypertension Father     OBJECTIVE:  Vitals:   03/27/20 1135  BP: 127/87  Pulse: 95  Resp: 18  Temp: 98.4 F (36.9 C)  SpO2: 95%     General appearance: alert; appears fatigued, but  nontoxic; speaking in full sentences and tolerating own secretions HEENT: NCAT; Ears: EACs clear, TMs pearly gray; Eyes: PERRL.  EOM grossly intact. Sinuses: Maxillary sinuses  tender; Nose: nares patent without rhinorrhea, Throat: oropharynx clear, tonsils non erythematous or enlarged, uvula midline  Neck: supple without LAD Lungs: unlabored respirations, symmetrical air entry; cough: mild; no respiratory distress; CTAB Heart: regular rate and rhythm.  Radial pulses 2+ symmetrical bilaterally Skin: warm and dry Psychological: alert and cooperative; normal mood and affect  LABS:  No results found for this or any previous visit (from the past 24 hour(s)).   ASSESSMENT & PLAN:  1. Acute recurrent maxillary sinusitis     Meds ordered this encounter  Medications  . dexamethasone (DECADRON) injection 10 mg  . amoxicillin-clavulanate (AUGMENTIN) 875-125 MG tablet    Sig: Take 1 tablet by mouth every 12 (twelve) hours.    Dispense:  14 tablet    Refill:  0    Discharge instructions  Get plenty of rest and push fluids Augmentin was prescribed  Continue to take Tessalon Perles for cough Continue to take Flonase as prescribed and directed Use medications daily for symptom relief Use OTC medications like ibuprofen or tylenol as needed fever or pain Call or go to the ED if you have any new or worsening symptoms such as fever, worsening cough, shortness of breath, chest tightness, chest pain, turning blue, changes in mental status, etc...   Reviewed expectations re: course of current medical issues. Questions answered. Outlined signs and symptoms indicating need for more acute intervention. Patient verbalized understanding. After Visit Summary given.         Emerson Monte, FNP 03/27/20 1246    Emerson Monte, FNP 03/27/20 1247

## 2020-03-27 NOTE — ED Triage Notes (Signed)
Pt presents with nasal congestion and pain for past week , negative covid

## 2020-05-20 MED FILL — FLUoxetine HCL 10 MG TABS: 10 | 90 days supply | Qty: 90 | Fill #1

## 2020-05-24 DIAGNOSIS — H524 Presbyopia: Secondary | ICD-10-CM | POA: Diagnosis not present

## 2020-06-02 ENCOUNTER — Other Ambulatory Visit (HOSPITAL_COMMUNITY): Payer: Self-pay | Admitting: Obstetrics and Gynecology

## 2020-06-02 MED FILL — METFORMIN HCL 1000 MG TABS: 1000 | 90 days supply | Qty: 180 | Fill #0

## 2020-06-29 DIAGNOSIS — D1801 Hemangioma of skin and subcutaneous tissue: Secondary | ICD-10-CM | POA: Diagnosis not present

## 2020-06-29 DIAGNOSIS — L814 Other melanin hyperpigmentation: Secondary | ICD-10-CM | POA: Diagnosis not present

## 2020-06-29 DIAGNOSIS — D171 Benign lipomatous neoplasm of skin and subcutaneous tissue of trunk: Secondary | ICD-10-CM | POA: Diagnosis not present

## 2020-06-29 DIAGNOSIS — D225 Melanocytic nevi of trunk: Secondary | ICD-10-CM | POA: Diagnosis not present

## 2020-06-29 DIAGNOSIS — D2239 Melanocytic nevi of other parts of face: Secondary | ICD-10-CM | POA: Diagnosis not present

## 2020-06-29 DIAGNOSIS — L918 Other hypertrophic disorders of the skin: Secondary | ICD-10-CM | POA: Diagnosis not present

## 2020-06-29 DIAGNOSIS — L218 Other seborrheic dermatitis: Secondary | ICD-10-CM | POA: Diagnosis not present

## 2020-06-29 DIAGNOSIS — D485 Neoplasm of uncertain behavior of skin: Secondary | ICD-10-CM | POA: Diagnosis not present

## 2020-06-29 DIAGNOSIS — L304 Erythema intertrigo: Secondary | ICD-10-CM | POA: Diagnosis not present

## 2020-06-29 DIAGNOSIS — D2272 Melanocytic nevi of left lower limb, including hip: Secondary | ICD-10-CM | POA: Diagnosis not present

## 2020-06-29 DIAGNOSIS — L738 Other specified follicular disorders: Secondary | ICD-10-CM | POA: Diagnosis not present

## 2020-06-30 ENCOUNTER — Other Ambulatory Visit (HOSPITAL_COMMUNITY): Payer: Self-pay | Admitting: Pharmacist

## 2020-07-12 DIAGNOSIS — Z139 Encounter for screening, unspecified: Secondary | ICD-10-CM | POA: Diagnosis not present

## 2020-07-12 DIAGNOSIS — E119 Type 2 diabetes mellitus without complications: Secondary | ICD-10-CM | POA: Diagnosis not present

## 2020-07-15 ENCOUNTER — Other Ambulatory Visit (HOSPITAL_COMMUNITY): Payer: Self-pay

## 2020-07-21 ENCOUNTER — Other Ambulatory Visit (HOSPITAL_COMMUNITY): Payer: Self-pay

## 2020-07-21 MED FILL — Pantoprazole Sodium EC Tab 40 MG (Base Equiv): ORAL | 90 days supply | Qty: 90 | Fill #0 | Status: AC

## 2020-07-25 ENCOUNTER — Other Ambulatory Visit (HOSPITAL_COMMUNITY): Payer: Self-pay

## 2020-07-26 ENCOUNTER — Other Ambulatory Visit (HOSPITAL_COMMUNITY): Payer: Self-pay

## 2020-08-15 ENCOUNTER — Other Ambulatory Visit (HOSPITAL_COMMUNITY): Payer: Self-pay

## 2020-08-30 ENCOUNTER — Other Ambulatory Visit (HOSPITAL_COMMUNITY): Payer: Self-pay

## 2020-08-30 DIAGNOSIS — E559 Vitamin D deficiency, unspecified: Secondary | ICD-10-CM | POA: Diagnosis not present

## 2020-08-30 DIAGNOSIS — E1165 Type 2 diabetes mellitus with hyperglycemia: Secondary | ICD-10-CM | POA: Diagnosis not present

## 2020-08-30 MED ORDER — METFORMIN HCL ER 500 MG PO TB24
500.0000 mg | ORAL_TABLET | Freq: Two times a day (BID) | ORAL | 4 refills | Status: DC
  Filled 2020-08-30: qty 180, 90d supply, fill #0
  Filled 2020-12-08: qty 180, 90d supply, fill #1
  Filled 2021-03-07: qty 180, 90d supply, fill #2
  Filled 2021-06-01: qty 180, 90d supply, fill #3

## 2020-08-31 ENCOUNTER — Other Ambulatory Visit (HOSPITAL_COMMUNITY): Payer: Self-pay

## 2020-09-01 ENCOUNTER — Other Ambulatory Visit (HOSPITAL_COMMUNITY): Payer: Self-pay

## 2020-09-06 DIAGNOSIS — Z124 Encounter for screening for malignant neoplasm of cervix: Secondary | ICD-10-CM | POA: Diagnosis not present

## 2020-09-06 DIAGNOSIS — Z01419 Encounter for gynecological examination (general) (routine) without abnormal findings: Secondary | ICD-10-CM | POA: Diagnosis not present

## 2020-09-06 DIAGNOSIS — Z130 Encounter for screening for diseases of the blood and blood-forming organs and certain disorders involving the immune mechanism: Secondary | ICD-10-CM | POA: Diagnosis not present

## 2020-09-06 DIAGNOSIS — Z1231 Encounter for screening mammogram for malignant neoplasm of breast: Secondary | ICD-10-CM | POA: Diagnosis not present

## 2020-09-06 DIAGNOSIS — Z1389 Encounter for screening for other disorder: Secondary | ICD-10-CM | POA: Diagnosis not present

## 2020-09-06 DIAGNOSIS — Z1151 Encounter for screening for human papillomavirus (HPV): Secondary | ICD-10-CM | POA: Diagnosis not present

## 2020-09-06 DIAGNOSIS — Z6841 Body Mass Index (BMI) 40.0 and over, adult: Secondary | ICD-10-CM | POA: Diagnosis not present

## 2020-09-07 DIAGNOSIS — Z124 Encounter for screening for malignant neoplasm of cervix: Secondary | ICD-10-CM | POA: Diagnosis not present

## 2020-09-07 DIAGNOSIS — Z1151 Encounter for screening for human papillomavirus (HPV): Secondary | ICD-10-CM | POA: Diagnosis not present

## 2020-09-20 ENCOUNTER — Other Ambulatory Visit (HOSPITAL_COMMUNITY): Payer: Self-pay

## 2020-09-20 MED ORDER — TRULICITY 0.75 MG/0.5ML ~~LOC~~ SOAJ
0.7500 mg | SUBCUTANEOUS | 6 refills | Status: DC
  Filled 2020-09-20: qty 6, 84d supply, fill #0

## 2020-09-20 MED ORDER — ONETOUCH VERIO VI STRP
ORAL_STRIP | 6 refills | Status: DC
Start: 2020-09-20 — End: 2022-08-13
  Filled 2020-09-20: qty 200, 100d supply, fill #0

## 2020-09-20 MED ORDER — TRULICITY 0.75 MG/0.5ML ~~LOC~~ SOAJ
SUBCUTANEOUS | 6 refills | Status: DC
  Filled 2020-09-20: qty 2, 28d supply, fill #0

## 2020-09-22 ENCOUNTER — Other Ambulatory Visit (HOSPITAL_COMMUNITY): Payer: Self-pay

## 2020-09-25 ENCOUNTER — Other Ambulatory Visit (HOSPITAL_COMMUNITY): Payer: Self-pay

## 2020-09-25 MED ORDER — FREESTYLE LITE W/DEVICE KIT
PACK | 1 refills | Status: AC
  Filled 2020-09-25: qty 1, 1d supply, fill #0

## 2020-09-25 MED ORDER — GLUCOSE BLOOD VI STRP
ORAL_STRIP | 6 refills | Status: DC
Start: 2020-09-25 — End: 2024-01-12
  Filled 2020-09-25: qty 100, 50d supply, fill #0

## 2020-10-02 DIAGNOSIS — H40053 Ocular hypertension, bilateral: Secondary | ICD-10-CM | POA: Diagnosis not present

## 2020-10-03 ENCOUNTER — Other Ambulatory Visit (HOSPITAL_COMMUNITY): Payer: Self-pay

## 2020-10-06 ENCOUNTER — Other Ambulatory Visit (HOSPITAL_COMMUNITY): Payer: Self-pay

## 2020-10-06 MED ORDER — FREESTYLE LANCETS MISC
6 refills | Status: DC
  Filled 2020-10-06: qty 100, 50d supply, fill #0

## 2020-10-10 ENCOUNTER — Other Ambulatory Visit (HOSPITAL_COMMUNITY): Payer: Self-pay

## 2020-10-10 MED ORDER — NITROFURANTOIN MONOHYD MACRO 100 MG PO CAPS
100.0000 mg | ORAL_CAPSULE | Freq: Two times a day (BID) | ORAL | 0 refills | Status: AC
  Filled 2020-10-10: qty 14, 7d supply, fill #0

## 2020-10-10 MED ORDER — PHENAZOPYRIDINE HCL 200 MG PO TABS
200.0000 mg | ORAL_TABLET | Freq: Three times a day (TID) | ORAL | 0 refills | Status: AC
  Filled 2020-10-10: qty 6, 2d supply, fill #0

## 2020-10-12 ENCOUNTER — Other Ambulatory Visit (HOSPITAL_COMMUNITY): Payer: Self-pay

## 2020-10-12 ENCOUNTER — Ambulatory Visit
Admission: EM | Admit: 2020-10-12 | Discharge: 2020-10-12 | Disposition: A | Discharge status: Home / Self Care | Payer: 59 | Attending: Emergency Medicine | Admitting: Emergency Medicine

## 2020-10-12 ENCOUNTER — Encounter: Payer: Self-pay | Admitting: Emergency Medicine

## 2020-10-12 ENCOUNTER — Ambulatory Visit: Admit: 2020-10-12 | Disposition: A | Discharge status: Home / Self Care | Payer: 59

## 2020-10-12 DIAGNOSIS — R300 Dysuria: Secondary | ICD-10-CM | POA: Diagnosis not present

## 2020-10-12 DIAGNOSIS — N3289 Other specified disorders of bladder: Secondary | ICD-10-CM | POA: Insufficient documentation

## 2020-10-12 LAB — POCT URINALYSIS DIP (MANUAL ENTRY)
Blood, UA: NEGATIVE
Glucose, UA: 100 mg/dL — AB
Nitrite, UA: NEGATIVE
Protein Ur, POC: NEGATIVE mg/dL
Spec Grav, UA: <=1.005 — AB (ref 1.010–1.025)
Urobilinogen, UA: 0.2 E.U./dL
pH, UA: 5 (ref 5.0–8.0)

## 2020-10-12 MED ORDER — CEFTRIAXONE SODIUM 1 G IJ SOLR
1.0000 g | Freq: Once | INTRAMUSCULAR | Status: AC
  Administered 2020-10-12: 15:00:00 1 g via INTRAMUSCULAR

## 2020-10-12 MED ORDER — FLUCONAZOLE 150 MG PO TABS
150.0000 mg | ORAL_TABLET | Freq: Every day | ORAL | 0 refills | Status: DC
Start: 2020-10-12 — End: 2022-08-13
  Filled 2020-10-12: qty 1, 1d supply, fill #0

## 2020-10-12 MED ORDER — TAMSULOSIN HCL 0.4 MG PO CAPS: 0.4000 mg | ORAL_CAPSULE | Freq: Every day | ORAL | 0 refills | Status: DC

## 2020-10-12 MED ORDER — CEPHALEXIN 500 MG PO CAPS: 500.0000 mg | ORAL_CAPSULE | Freq: Three times a day (TID) | ORAL | 0 refills | Status: AC

## 2020-10-12 NOTE — ED Triage Notes (Signed)
Currently taking macrobid and pyridium since Tuesday for UTI.  States she is still having spasms and burning on urination.

## 2020-10-12 NOTE — ED Provider Notes (Signed)
[facility]   CC: Burning with urination  SUBJECTIVE:  [name] is a 45 y.o. 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  Localizes the spasms to lower abdominal.  Currently taking macrobid and pyridium without relief.  Symptoms are made worse with urination.  Admits to similar symptoms in the past with UTI.  Denies fever, chills, nausea, vomiting, abdominal pain, flank pain, abnormal vaginal discharge or bleeding, hematuria.    LMP: No LMP recorded. (Menstrual status: IUD).  ROS: As in HPI.  All other pertinent ROS negative.     Past Medical History:  Diagnosis Date   Back pain    GERD (gastroesophageal reflux disease)    Palpitations    PONV (postoperative nausea and vomiting)    Spinal headache    Past Surgical History:  Procedure Laterality Date   CESAREAN SECTION     Left knee arthroscopic surgery     WISDOM TOOTH EXTRACTION     Allergies  Allergen Reactions   Erythromycin Base    No current facility-administered medications on file prior to encounter.   Current Outpatient Medications on File Prior to Encounter  Medication Sig Dispense Refill   azelastine (ASTELIN) 0.1 % nasal spray Place 1 spray into both nostrils 2 (two) times daily. Use in each nostril as directed 30 mL 0   benzonatate (TESSALON) 100 MG capsule Take 1-2 capsules (100-200 mg total) by mouth 3 (three) times daily as needed for cough. 40 capsule 0   Blood Glucose Monitoring Suppl (FREESTYLE LITE) w/Device KIT Use as directed 1 kit 1   calcium-vitamin D (OSCAL WITH D) 500-200 MG-UNIT tablet Take 1 tablet by mouth.     cholecalciferol (VITAMIN D3) 25 MCG (1000 UT) tablet Take 5,000 Units by mouth daily.     COVID-19 At Home Antigen Test KIT USE AS DIRECTED WITHIN PACKAGE INSERT 4 kit 0   cyclobenzaprine (FLEXERIL) 5 MG tablet TAKE 1 TABLET (5 MG TOTAL) BY MOUTH 3 (THREE) TIMES DAILY AS  NEEDED. 30 tablet 0   Dulaglutide (TRULICITY) 5.85 ID/7.8EU SOPN Inject 0.75 mg into the skin once a week. 2 mL 6   FLUoxetine (PROZAC) 10 MG tablet TAKE 1 TABLET BY MOUTH ONCE A DAY 90 tablet 2   fluticasone (FLONASE) 50 MCG/ACT nasal spray Place 2 sprays into both nostrils daily. 16 g 6   glucose blood (ONETOUCH VERIO) test strip Test 2 times daily 200 each 6   glucose blood test strip Use as directed twice daily 100 each 6   Lancets (FREESTYLE) lancets Use as directed 2 times daily 100 each 6   levonorgestrel (MIRENA) 20 MCG/24HR IUD 1 each by Intrauterine route once.     metFORMIN (GLUCOPHAGE) 1000 MG tablet TAKE 1 TABLET BY MOUTH TWICE A DAY 180 tablet 0   metFORMIN (GLUCOPHAGE) 500 MG tablet 1,000 mg 2 (two) times daily with a meal.      metFORMIN (GLUCOPHAGE-XR) 500 MG 24 hr tablet Take 1 tablet (500 mg total) by mouth 2 (two) times daily. 180 tablet 4   nitrofurantoin, macrocrystal-monohydrate, (MACROBID) 100 MG capsule Take 1 capsule (100 mg total) by mouth every 12 (twelve) hours for 7 days. 14 capsule 0   pantoprazole (PROTONIX) 40 MG tablet pantoprazole 40 mg tablet,delayed release     pantoprazole (PROTONIX) 40 MG tablet TAKE 1 TABLET BY MOUTH ONCE DAILY 90 tablet 1  phenazopyridine (PYRIDIUM) 200 MG tablet Take 1 tablet (200 mg total) by mouth 3 (three) times daily for 2 days. 6 tablet 0   predniSONE (DELTASONE) 10 MG tablet TAKE 6 TABS BY MOUTH ON DAY 1; 5 TABS ON DAY 2; 4 TABS ON DAY 3; 3 TABS ON DAY 4; 2 TABS ON DAY 5; 1 TAB ON DAY 6 THEN STOP. 21 tablet 0   predniSONE (DELTASONE) 10 MG tablet TAKE 2 TABLETS (20 MG TOTAL) BY MOUTH DAILY. 15 tablet 0   [DISCONTINUED] esomeprazole (NEXIUM) 40 MG capsule Take 40 mg by mouth daily before breakfast.     [DISCONTINUED] metoprolol succinate (TOPROL-XL) 50 MG 24 hr tablet Take 25 mg by mouth daily. 1/2 tablet daily     Social History   Socioeconomic History   Marital status: Divorced    Spouse name: Not on file   Number of  children: 2   Years of education: Not on file   Highest education level: Not on file  Occupational History    Employer: [employer]  Tobacco Use   Smoking status: Former    Pack years: 0.00    Types: Cigarettes    Quit date: 08/03/2002    Years since quitting: 18.2   Smokeless tobacco: Never  Substance and Sexual Activity   Alcohol use: No   Drug use: No   Sexual activity: Not on file  Other Topics Concern   Not on file  Social History Narrative   Not on file   Social Determinants of Health   Financial Resource Strain: Not on file  Food Insecurity: Not on file  Transportation Needs: Not on file  Physical Activity: Not on file  Stress: Not on file  Social Connections: Not on file  Intimate Partner Violence: Not on file   Family History  Problem Relation Age of Onset   Hypertension Mother    Stroke Mother    Anesthesia problems Mother    Anesthesia problems Sister    Hypertension Father     OBJECTIVE:  Vitals:   10/12/20 1421  BP: (!) 150/92  Pulse: 93  Resp: 16  Temp: (!) 97.5 F (36.4 C)  TempSrc: Oral  SpO2: 97%   General appearance: AOx3 in no acute distress HEENT: NCAT.  Oropharynx clear.  Lungs: clear to auscultation bilaterally without adventitious breath sounds Heart: regular rate and rhythm.   Abdomen: soft; non-distended; no tenderness; bowel sounds present; no guarding Back: no CVA tenderness Extremities: no edema; symmetrical with no gross deformities Skin: warm and dry Neurologic: Ambulates from chair to exam table without difficulty Psychological: alert and cooperative; normal mood and affect  Labs Reviewed  POCT URINALYSIS DIP (MANUAL ENTRY) - Abnormal; Notable for the following components:      Result Value   Color, UA orange (*)    All other components within normal limits  URINE CULTURE    ASSESSMENT & PLAN:  1. Bladder spasm   2. Dysuria     Meds ordered this encounter  Medications   cephALEXin (KEFLEX) 500 MG capsule     Sig: Take 1 capsule (500 mg total) by mouth 3 (three) times daily for 10 days.    Dispense:  30 capsule    Refill:  0    Order Specific Question:   Supervising Provider    Answer:   Raylene Everts [[id number]]   tamsulosin (FLOMAX) 0.4 MG CAPS capsule    Sig: Take 1 capsule (0.4 mg total) by mouth daily after breakfast.  Dispense:  30 capsule    Refill:  0    Order Specific Question:   Supervising Provider    Answer:   Raylene Everts [[id number]]   cefTRIAXone (ROCEPHIN) injection 1 g   Macrobid without relief. We will try rocephin injection and keflex to cover for worsening infection Urine culture sent.  We will call you with abnormal results.   Push fluids and get plenty of rest.   Take antibiotic as directed and to completion Take pyridium as prescribed and as needed for symptomatic relief Follow up with PCP if symptoms persists Return here or go to ER if you have any new or worsening symptoms such as fever, worsening abdominal pain, nausea/vomiting, flank pain, etc...  Flomax for possible kidney stone as well  Outlined signs and symptoms indicating need for more acute intervention. Patient verbalized understanding. After Visit Summary given.      Lestine Box, PA-C 10/12/20 1452

## 2020-10-12 NOTE — Discharge Instructions (Addendum)
Macrobid without relief. We will try rocephin injection and keflex to cover for worsening infection Urine culture sent.  We will call you with abnormal results.   Push fluids and get plenty of rest.   Take antibiotic as directed and to completion Take pyridium as prescribed and as needed for symptomatic relief Follow up with PCP if symptoms persists Return here or go to ER if you have any new or worsening symptoms such as fever, worsening abdominal pain, nausea/vomiting, flank pain, etc...  Flomax for possible kidney stone as well

## 2020-10-14 LAB — URINE CULTURE: Special Requests: NORMAL

## 2020-10-20 ENCOUNTER — Other Ambulatory Visit (HOSPITAL_COMMUNITY): Payer: Self-pay

## 2020-10-23 ENCOUNTER — Other Ambulatory Visit (HOSPITAL_COMMUNITY): Payer: Self-pay

## 2020-10-23 MED ORDER — PANTOPRAZOLE SODIUM 40 MG PO TBEC
40.0000 mg | DELAYED_RELEASE_TABLET | Freq: Every day | ORAL | 3 refills | Status: DC
Start: 2020-10-23 — End: 2022-08-13
  Filled 2020-10-23 – 2020-10-24 (×2): qty 90, 90d supply, fill #0
  Filled 2021-02-09: qty 90, 90d supply, fill #1
  Filled 2021-02-13: qty 90, 90d supply, fill #0
  Filled 2021-05-16: qty 90, 90d supply, fill #1
  Filled 2021-10-05: qty 90, 90d supply, fill #2

## 2020-10-24 ENCOUNTER — Other Ambulatory Visit (HOSPITAL_COMMUNITY): Payer: Self-pay

## 2020-11-03 ENCOUNTER — Other Ambulatory Visit: Payer: Self-pay

## 2020-11-03 ENCOUNTER — Ambulatory Visit
Admission: RE | Admit: 2020-11-03 | Discharge: 2020-11-03 | Disposition: A | Discharge status: Home / Self Care | Payer: 59 | Source: Ambulatory Visit | Attending: Emergency Medicine | Admitting: Emergency Medicine

## 2020-11-03 VITALS — BP 118/80 | HR 81 | Temp 98.8°F | Resp 16

## 2020-11-03 DIAGNOSIS — H9201 Otalgia, right ear: Secondary | ICD-10-CM | POA: Diagnosis not present

## 2020-11-03 DIAGNOSIS — H6981 Other specified disorders of Eustachian tube, right ear: Secondary | ICD-10-CM

## 2020-11-03 MED ORDER — PREDNISONE 20 MG PO TABS: 20.0000 mg | ORAL_TABLET | Freq: Two times a day (BID) | ORAL | 0 refills | Status: AC

## 2020-11-03 MED ORDER — CYCLOBENZAPRINE HCL 10 MG PO TABS: 10.0000 mg | ORAL_TABLET | Freq: Two times a day (BID) | ORAL | 0 refills | Status: DC | PRN

## 2020-11-03 NOTE — ED Provider Notes (Signed)
[place]   [id number] 11/03/20 Arrival Time: [place]  CC:EAR PAIN  SUBJECTIVE: History from: patient.  [name] is a 45 y.o. female who presents with of RT ear and RT jaw pain intermittent for the past few months.  Denies a precipitating event, or trauma.  Patient states the pain is intermittent and spasm/ contraction in character.  Patient has tried OTC medications with minimal relief.  Symptoms are made worse with working, possibly clenching jaw at work.  Denies similar symptoms in the past.  Denies fever, chills, fatigue, sinus pain, rhinorrhea, ear discharge, sore throat, SOB, wheezing, chest pain, nausea, changes in bowel or bladder habits.    ROS: As per HPI.  All other pertinent ROS negative.     Past Medical History:  Diagnosis Date   Back pain    GERD (gastroesophageal reflux disease)    Palpitations    PONV (postoperative nausea and vomiting)    Spinal headache    Past Surgical History:  Procedure Laterality Date   CESAREAN SECTION     Left knee arthroscopic surgery     WISDOM TOOTH EXTRACTION     Allergies  Allergen Reactions   Erythromycin Base    No current facility-administered medications on file prior to encounter.   Current Outpatient Medications on File Prior to Encounter  Medication Sig Dispense Refill   azelastine (ASTELIN) 0.1 % nasal spray Place 1 spray into both nostrils 2 (two) times daily. Use in each nostril as directed 30 mL 0   Blood Glucose Monitoring Suppl (FREESTYLE LITE) w/Device KIT Use as directed 1 kit 1   calcium-vitamin D (OSCAL WITH D) 500-200 MG-UNIT tablet Take 1 tablet by mouth.     cholecalciferol (VITAMIN D3) 25 MCG (1000 UT) tablet Take 5,000 Units by mouth daily.     COVID-19 At Home Antigen Test KIT USE AS DIRECTED WITHIN PACKAGE INSERT 4 kit 0   Dulaglutide (TRULICITY) 5.40 GQ/6.7YP SOPN Inject 0.75 mg into the skin once a week. 2 mL 6   fluconazole (DIFLUCAN) 150 MG tablet Take 1 tablet (150 mg total) by mouth  once 1 tablet 0   FLUoxetine (PROZAC) 10 MG tablet TAKE 1 TABLET BY MOUTH ONCE A DAY 90 tablet 2   fluticasone (FLONASE) 50 MCG/ACT nasal spray Place 2 sprays into both nostrils daily. 16 g 6   glucose blood (ONETOUCH VERIO) test strip Test 2 times daily 200 each 6   glucose blood test strip Use as directed twice daily 100 each 6   Lancets (FREESTYLE) lancets Use as directed 2 times daily 100 each 6   levonorgestrel (MIRENA) 20 MCG/24HR IUD 1 each by Intrauterine route once.     metFORMIN (GLUCOPHAGE) 1000 MG tablet TAKE 1 TABLET BY MOUTH TWICE A DAY 180 tablet 0   metFORMIN (GLUCOPHAGE) 500 MG tablet 1,000 mg 2 (two) times daily with a meal.      metFORMIN (GLUCOPHAGE-XR) 500 MG 24 hr tablet Take 1 tablet (500 mg total) by mouth 2 (two) times daily. 180 tablet 4   pantoprazole (PROTONIX) 40 MG tablet pantoprazole 40 mg tablet,delayed release     pantoprazole (PROTONIX) 40 MG tablet TAKE 1 TABLET BY MOUTH ONCE DAILY 90 tablet 1   pantoprazole (PROTONIX) 40 MG tablet Take 1 tablet (40 mg total) by mouth daily. 90 tablet 3   tamsulosin (FLOMAX) 0.4 MG CAPS capsule Take 1 capsule (0.4 mg total) by mouth daily after breakfast. 30 capsule 0   [DISCONTINUED] esomeprazole (NEXIUM) 40 MG  capsule Take 40 mg by mouth daily before breakfast.     [DISCONTINUED] metoprolol succinate (TOPROL-XL) 50 MG 24 hr tablet Take 25 mg by mouth daily. 1/2 tablet daily     Social History   Socioeconomic History   Marital status: Divorced    Spouse name: Not on file   Number of children: 2   Years of education: Not on file   Highest education level: Not on file  Occupational History    Employer: [employer]  Tobacco Use   Smoking status: Former    Types: Cigarettes    Quit date: 08/03/2002    Years since quitting: 18.2   Smokeless tobacco: Never  Substance and Sexual Activity   Alcohol use: No   Drug use: No   Sexual activity: Not on file  Other Topics Concern   Not on file  Social History Narrative    Not on file   Social Determinants of Health   Financial Resource Strain: Not on file  Food Insecurity: Not on file  Transportation Needs: Not on file  Physical Activity: Not on file  Stress: Not on file  Social Connections: Not on file  Intimate Partner Violence: Not on file   Family History  Problem Relation Age of Onset   Hypertension Mother    Stroke Mother    Anesthesia problems Mother    Anesthesia problems Sister    Hypertension Father     OBJECTIVE:  Vitals:   11/03/20 0857  BP: 118/80  Pulse: 81  Resp: 16  Temp: 98.8 F (37.1 C)  TempSrc: Oral  SpO2: 96%    General appearance: alert; well-appearing, nontoxic; speaking in full sentences and tolerating own secretions HEENT: NCAT; Ears: EACs clear, TMs pearly gray; Eyes: PERRL.  EOM grossly intact.Nose: nares patent without rhinorrhea, Throat: oropharynx clear, tonsils non erythematous or enlarged, uvula midline; some clicking with opening jaw over LT side Neck: supple without LAD Lungs: unlabored respirations, symmetrical air entry; cough: absent; no respiratory distress; CTAB Heart: regular rate and rhythm.  Skin: warm and dry Psychological: alert and cooperative; normal mood and affect   ASSESSMENT & PLAN:  1. Right ear pain   2. Eustachian tube dysfunction, right     Meds ordered this encounter  Medications   predniSONE (DELTASONE) 20 MG tablet    Sig: Take 1 tablet (20 mg total) by mouth 2 (two) times daily with a meal for 5 days.    Dispense:  10 tablet    Refill:  0    Order Specific Question:   Supervising Provider    Answer:   Raylene Everts [[id number]]   cyclobenzaprine (FLEXERIL) 10 MG tablet    Sig: Take 1 tablet (10 mg total) by mouth 2 (two) times daily as needed for muscle spasms.    Dispense:  20 tablet    Refill:  0    Order Specific Question:   Supervising Provider    Answer:   Raylene Everts [[id number]]    Rest and drink plenty of fluids Prescribed prednisone and flexeril,  muscle relaxer, for possible TMJ, the steroid should also help with eustachian tube dysfunction Muscle relaxer may make you drowsy.  Do not take prior to driving or operating heavy machinery Take medications as directed and to completion Continue to use OTC ibuprofen and/ or tylenol as needed for pain control Follow up with PCP if symptoms persists Return here or go to the ER if you have any new or worsening symptoms fever, chills,  nausea, vomiting, redness, swelling, drainage, etc...  Reviewed expectations re: course of current medical issues. Questions answered. Outlined signs and symptoms indicating need for more acute intervention. Patient verbalized understanding. After Visit Summary given.          Lestine Box, PA-C 11/03/20 [phone]

## 2020-11-03 NOTE — Discharge Instructions (Addendum)
Rest and drink plenty of fluids Prescribed prednisone and flexeril, muscle relaxer, for possible TMJ, the steroid should also help with eustachian tube dysfunction Muscle relaxer may make you drowsy.  Do not take prior to driving or operating heavy machinery Take medications as directed and to completion Continue to use OTC ibuprofen and/ or tylenol as needed for pain control Follow up with PCP if symptoms persists Return here or go to the ER if you have any new or worsening symptoms fever, chills, nausea, vomiting, redness, swelling, drainage, etc..Marland Kitchen

## 2020-11-03 NOTE — ED Triage Notes (Signed)
Right ear pain on an off for the past few months

## 2020-12-08 ENCOUNTER — Other Ambulatory Visit (HOSPITAL_COMMUNITY): Payer: Self-pay

## 2020-12-08 DIAGNOSIS — E1165 Type 2 diabetes mellitus with hyperglycemia: Secondary | ICD-10-CM | POA: Diagnosis not present

## 2020-12-15 ENCOUNTER — Other Ambulatory Visit (HOSPITAL_COMMUNITY): Payer: Self-pay

## 2020-12-15 DIAGNOSIS — E1165 Type 2 diabetes mellitus with hyperglycemia: Secondary | ICD-10-CM | POA: Diagnosis not present

## 2020-12-15 DIAGNOSIS — E559 Vitamin D deficiency, unspecified: Secondary | ICD-10-CM | POA: Diagnosis not present

## 2020-12-15 MED ORDER — TRULICITY 0.75 MG/0.5ML ~~LOC~~ SOAJ
0.7500 mg | SUBCUTANEOUS | 4 refills | Status: DC
  Filled 2020-12-15: qty 6, 84d supply, fill #0
  Filled 2021-03-19: qty 6, 84d supply, fill #1

## 2020-12-19 ENCOUNTER — Other Ambulatory Visit (HOSPITAL_COMMUNITY): Payer: Self-pay

## 2020-12-19 MED ORDER — QUICKVUE AT-HOME COVID-19 TEST VI KIT
PACK | 0 refills | Status: DC
  Filled 2020-12-19: qty 2, 2d supply, fill #0

## 2021-02-09 ENCOUNTER — Other Ambulatory Visit (HOSPITAL_COMMUNITY): Payer: Self-pay

## 2021-02-10 ENCOUNTER — Other Ambulatory Visit (HOSPITAL_COMMUNITY): Payer: Self-pay

## 2021-02-13 ENCOUNTER — Other Ambulatory Visit (HOSPITAL_COMMUNITY): Payer: Self-pay

## 2021-03-07 ENCOUNTER — Other Ambulatory Visit (HOSPITAL_COMMUNITY): Payer: Self-pay

## 2021-03-20 ENCOUNTER — Other Ambulatory Visit (HOSPITAL_COMMUNITY): Payer: Self-pay

## 2021-05-16 ENCOUNTER — Other Ambulatory Visit (HOSPITAL_COMMUNITY): Payer: Self-pay

## 2021-05-17 ENCOUNTER — Other Ambulatory Visit (HOSPITAL_COMMUNITY): Payer: Self-pay

## 2021-06-01 ENCOUNTER — Other Ambulatory Visit (HOSPITAL_COMMUNITY): Payer: Self-pay

## 2021-06-11 ENCOUNTER — Other Ambulatory Visit (HOSPITAL_COMMUNITY): Payer: Self-pay

## 2021-06-13 DIAGNOSIS — H524 Presbyopia: Secondary | ICD-10-CM | POA: Diagnosis not present

## 2021-06-13 DIAGNOSIS — H40053 Ocular hypertension, bilateral: Secondary | ICD-10-CM | POA: Diagnosis not present

## 2021-06-14 ENCOUNTER — Other Ambulatory Visit (HOSPITAL_COMMUNITY): Payer: Self-pay

## 2021-06-14 DIAGNOSIS — E1165 Type 2 diabetes mellitus with hyperglycemia: Secondary | ICD-10-CM | POA: Diagnosis not present

## 2021-06-14 MED ORDER — METFORMIN HCL ER 500 MG PO TB24
500.0000 mg | ORAL_TABLET | Freq: Every day | ORAL | 0 refills | Status: DC
  Filled 2021-06-14: qty 30, 30d supply, fill #0

## 2021-06-14 MED ORDER — MOUNJARO 5 MG/0.5ML ~~LOC~~ SOAJ
5.0000 mg | SUBCUTANEOUS | 6 refills | Status: DC
  Filled 2021-06-14: qty 2, 28d supply, fill #0
  Filled 2021-07-17: qty 6, 84d supply, fill #1

## 2021-07-17 ENCOUNTER — Other Ambulatory Visit (HOSPITAL_COMMUNITY): Payer: Self-pay

## 2021-08-21 ENCOUNTER — Other Ambulatory Visit (HOSPITAL_COMMUNITY): Payer: Self-pay

## 2021-08-21 MED ORDER — TRULICITY 0.75 MG/0.5ML ~~LOC~~ SOAJ
0.7500 mg | SUBCUTANEOUS | 6 refills | Status: DC
  Filled 2021-08-21: qty 6, 84d supply, fill #0

## 2021-08-21 MED ORDER — TRULICITY 0.75 MG/0.5ML ~~LOC~~ SOAJ
0.7500 mg | SUBCUTANEOUS | 6 refills | Status: DC
  Filled 2021-08-21: qty 2, 28d supply, fill #0

## 2021-08-22 ENCOUNTER — Other Ambulatory Visit (HOSPITAL_COMMUNITY): Payer: Self-pay

## 2021-08-24 ENCOUNTER — Other Ambulatory Visit (HOSPITAL_COMMUNITY): Payer: Self-pay

## 2021-08-27 ENCOUNTER — Other Ambulatory Visit (HOSPITAL_COMMUNITY): Payer: Self-pay

## 2021-09-18 ENCOUNTER — Other Ambulatory Visit (HOSPITAL_COMMUNITY): Payer: Self-pay

## 2021-09-18 MED ORDER — ESCITALOPRAM OXALATE 20 MG PO TABS
20.0000 mg | ORAL_TABLET | Freq: Every day | ORAL | 0 refills | Status: DC
  Filled 2021-09-18: qty 90, 90d supply, fill #0

## 2021-10-05 DIAGNOSIS — Z130 Encounter for screening for diseases of the blood and blood-forming organs and certain disorders involving the immune mechanism: Secondary | ICD-10-CM | POA: Diagnosis not present

## 2021-10-05 DIAGNOSIS — Z1231 Encounter for screening mammogram for malignant neoplasm of breast: Secondary | ICD-10-CM | POA: Diagnosis not present

## 2021-10-05 DIAGNOSIS — Z01419 Encounter for gynecological examination (general) (routine) without abnormal findings: Secondary | ICD-10-CM | POA: Diagnosis not present

## 2021-10-05 DIAGNOSIS — Z1389 Encounter for screening for other disorder: Secondary | ICD-10-CM | POA: Diagnosis not present

## 2021-10-08 ENCOUNTER — Other Ambulatory Visit (HOSPITAL_COMMUNITY): Payer: Self-pay

## 2021-10-16 IMAGING — DX DG SHOULDER 2+V*R*
3 series · 3 of 3 positions shown · non-contrast
Comparison: None.

CLINICAL DATA: Pain following fall

EXAM:
RIGHT SHOULDER - 2+ VIEW

[shoulder grashey]
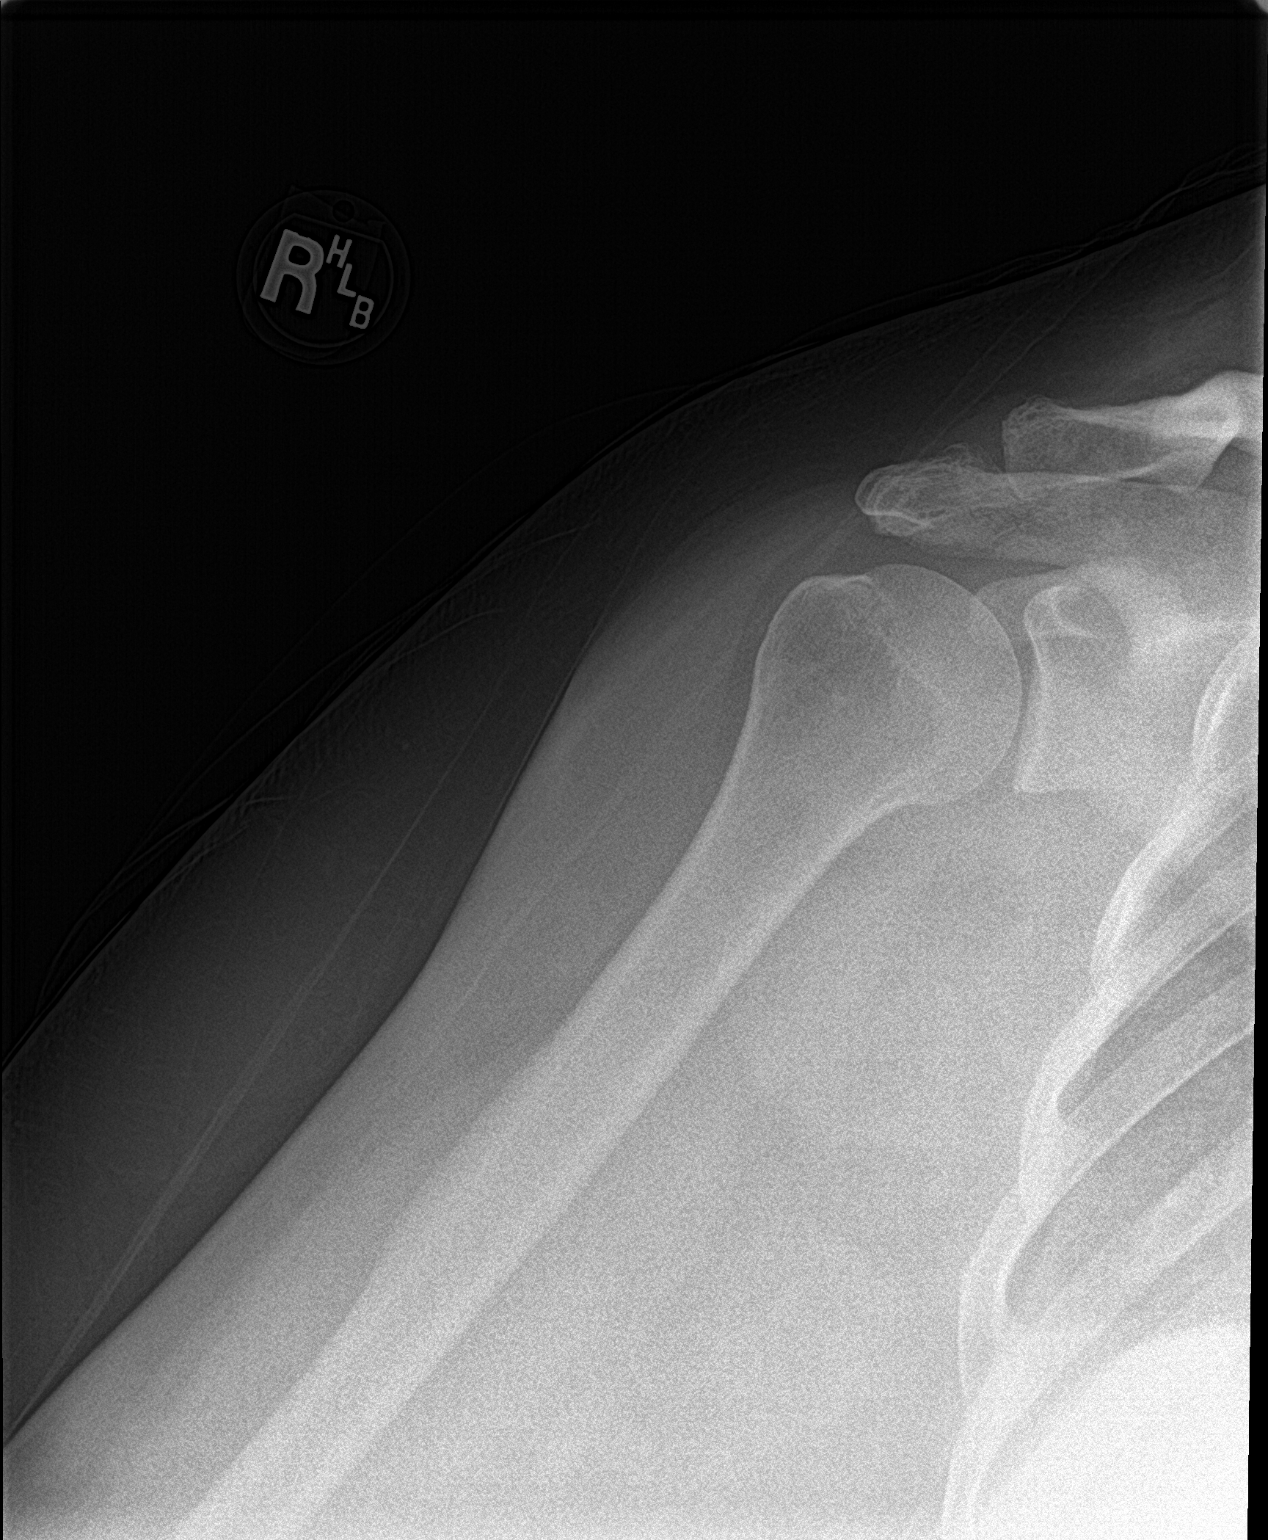

[shoulder y view]
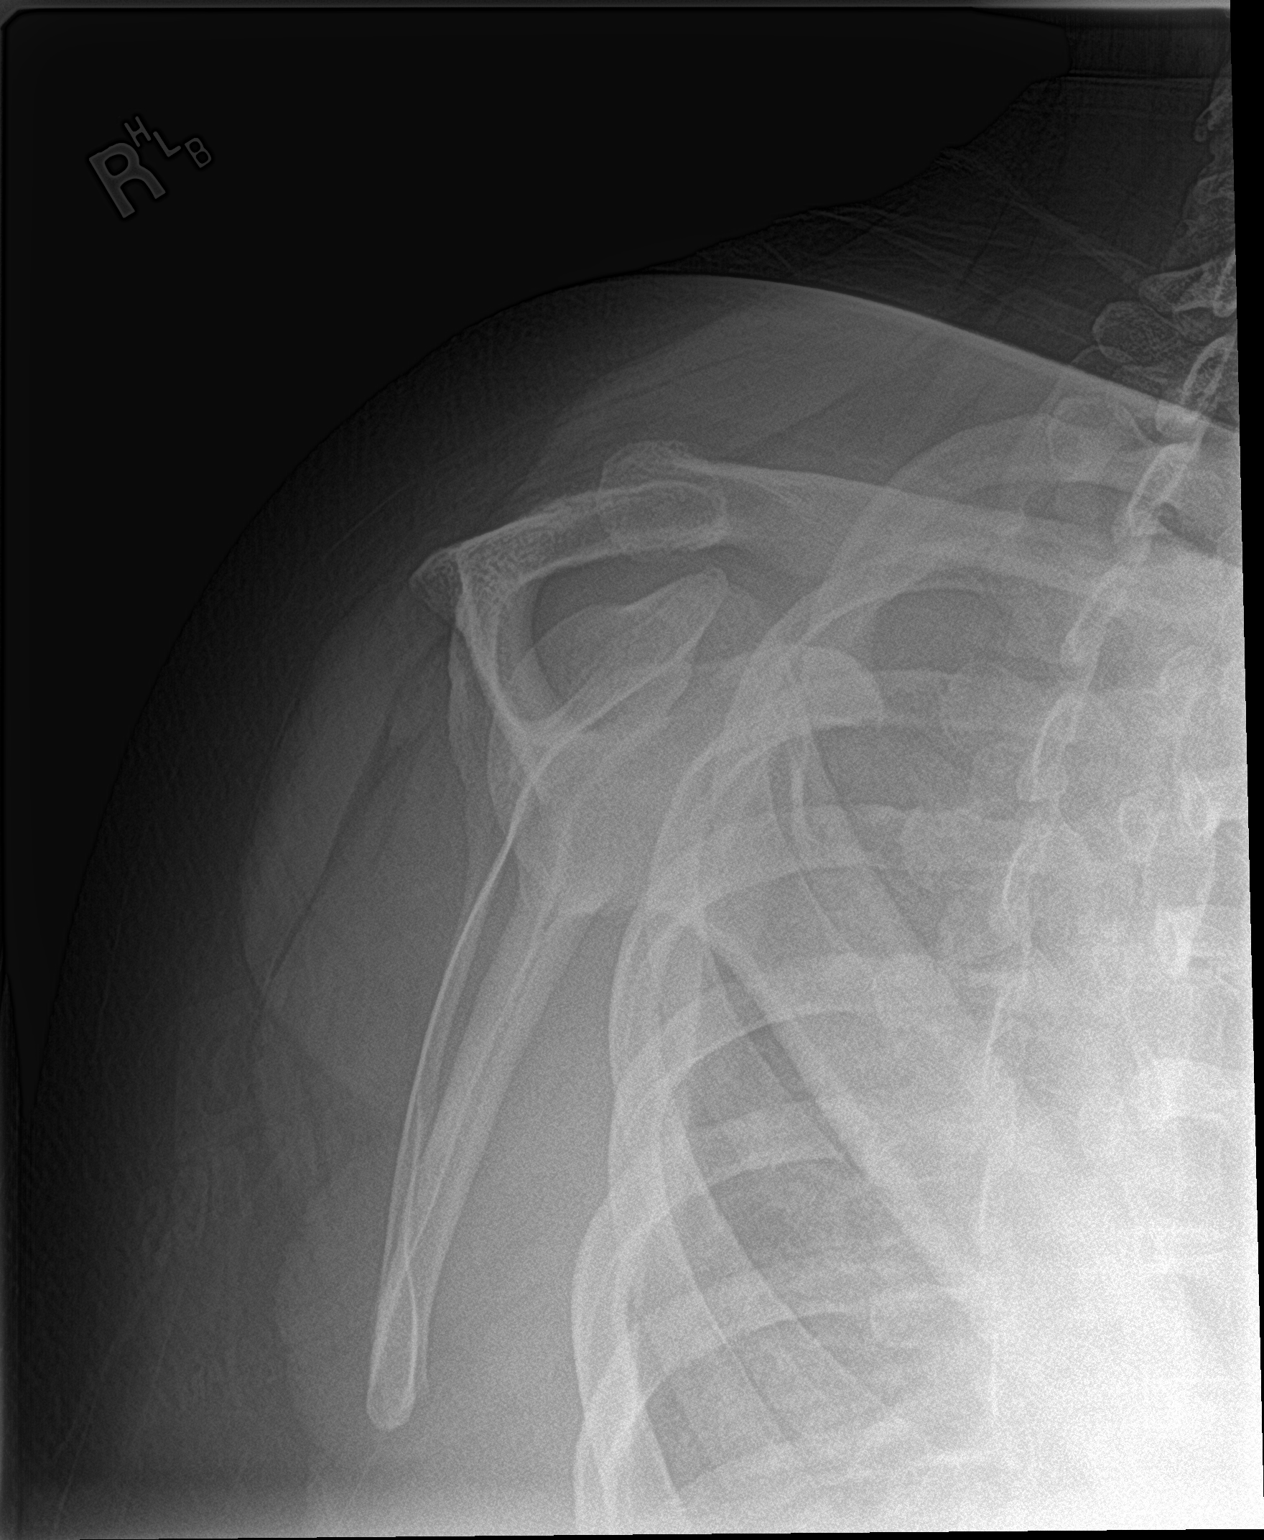

[shoulder axillary]
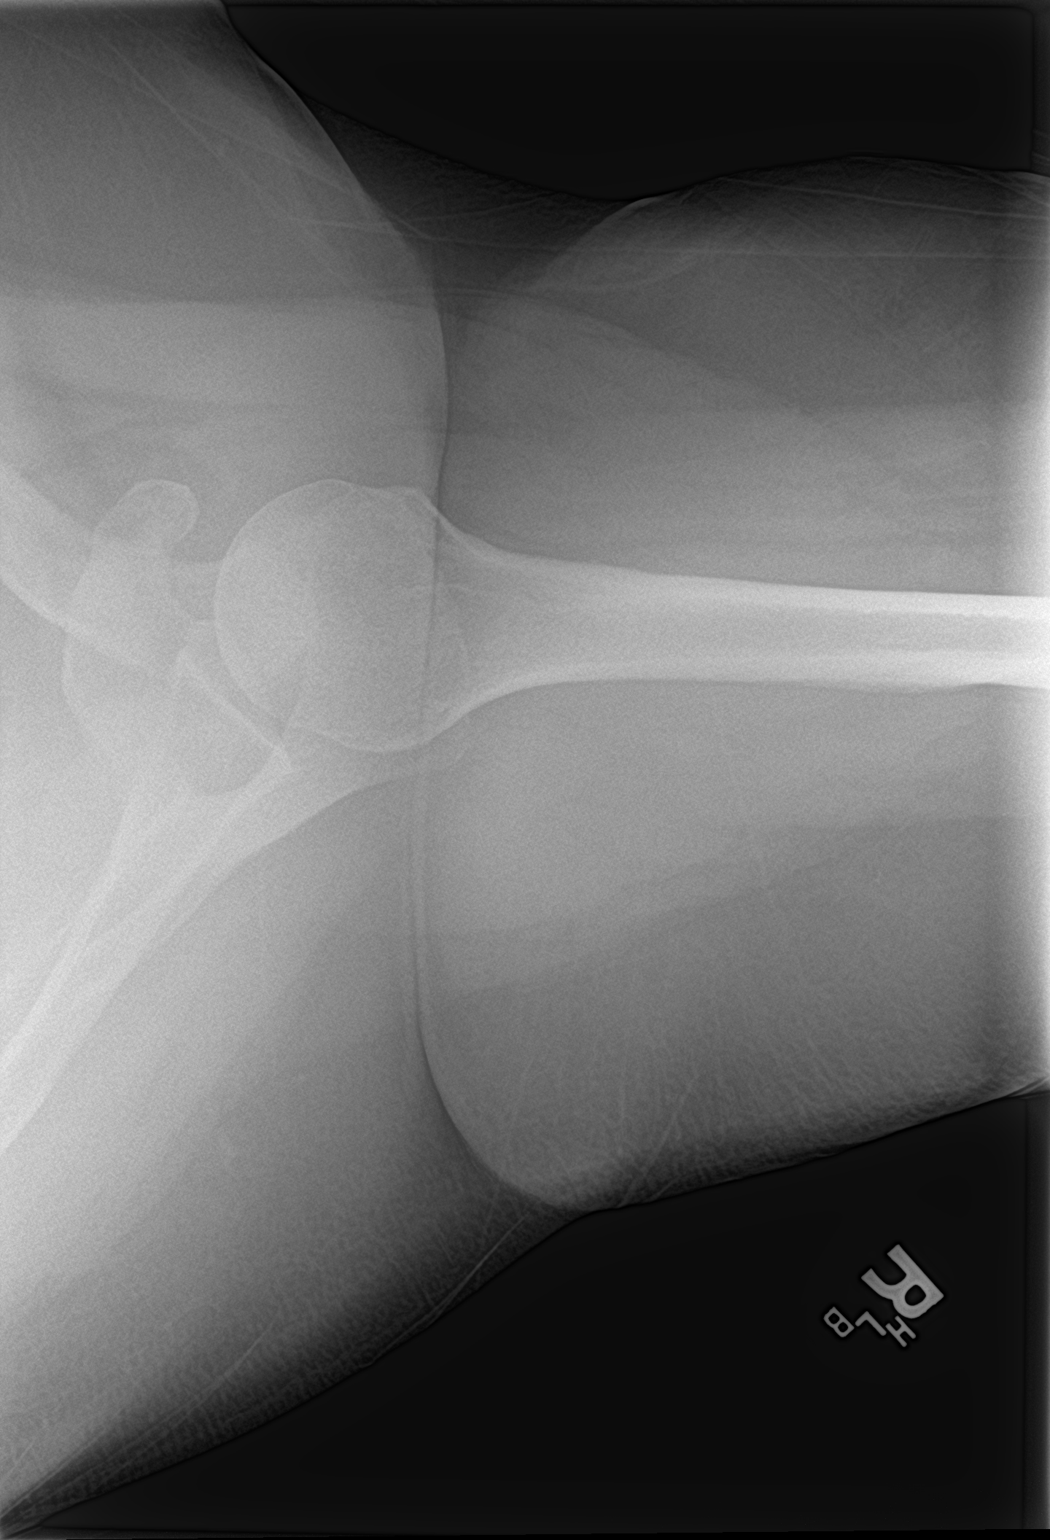

[3 of 3 positions shown; findings below may reference images not displayed]

FINDINGS: Oblique, Y scapular, axillary images were obtained. No demonstrable
fracture or dislocation. There is mild narrowing of the
acromioclavicular joint. Glenohumeral joint appears normal. No
erosive change or paraspinous lesion. Visualized right lung clear.
IMPRESSION: Narrowing acromioclavicular joint.  No fracture or dislocation.

## 2021-11-07 ENCOUNTER — Other Ambulatory Visit (HOSPITAL_COMMUNITY): Payer: Self-pay

## 2021-11-07 MED ORDER — TRULICITY 1.5 MG/0.5ML ~~LOC~~ SOAJ
1.5000 mg | SUBCUTANEOUS | 6 refills | Status: DC
  Filled 2021-11-07: qty 6, 84d supply, fill #0
  Filled 2022-01-31: qty 6, 84d supply, fill #1

## 2021-11-12 ENCOUNTER — Other Ambulatory Visit (HOSPITAL_COMMUNITY): Payer: Self-pay

## 2021-11-30 ENCOUNTER — Other Ambulatory Visit (HOSPITAL_COMMUNITY): Payer: Self-pay

## 2021-11-30 MED ORDER — METFORMIN HCL ER 500 MG PO TB24
500.0000 mg | ORAL_TABLET | Freq: Two times a day (BID) | ORAL | 4 refills | Status: DC
  Filled 2021-11-30: qty 180, 90d supply, fill #0
  Filled 2022-03-01: qty 180, 90d supply, fill #1
  Filled 2022-08-13: qty 180, 90d supply, fill #2

## 2021-12-11 DIAGNOSIS — E1165 Type 2 diabetes mellitus with hyperglycemia: Secondary | ICD-10-CM | POA: Diagnosis not present

## 2021-12-11 DIAGNOSIS — E559 Vitamin D deficiency, unspecified: Secondary | ICD-10-CM | POA: Diagnosis not present

## 2021-12-18 ENCOUNTER — Other Ambulatory Visit (HOSPITAL_COMMUNITY): Payer: Self-pay

## 2021-12-18 DIAGNOSIS — E559 Vitamin D deficiency, unspecified: Secondary | ICD-10-CM | POA: Diagnosis not present

## 2021-12-18 DIAGNOSIS — E1165 Type 2 diabetes mellitus with hyperglycemia: Secondary | ICD-10-CM | POA: Diagnosis not present

## 2021-12-18 MED ORDER — GLUCOSE BLOOD VI STRP
ORAL_STRIP | Freq: Two times a day (BID) | 4 refills | Status: DC
  Filled 2021-12-18 – 2021-12-26 (×2): qty 100, 50d supply, fill #0
  Filled 2022-08-13: qty 100, 50d supply, fill #1

## 2021-12-26 ENCOUNTER — Other Ambulatory Visit (HOSPITAL_COMMUNITY): Payer: Self-pay

## 2021-12-27 ENCOUNTER — Other Ambulatory Visit (HOSPITAL_COMMUNITY): Payer: Self-pay

## 2021-12-27 MED ORDER — FREESTYLE LANCETS MISC
6 refills | Status: DC
  Filled 2021-12-27: qty 100, 50d supply, fill #0
  Filled 2022-08-13: qty 100, 50d supply, fill #1

## 2022-01-31 ENCOUNTER — Other Ambulatory Visit (HOSPITAL_COMMUNITY): Payer: Self-pay

## 2022-02-01 ENCOUNTER — Other Ambulatory Visit (HOSPITAL_COMMUNITY): Payer: Self-pay

## 2022-02-01 MED ORDER — ESCITALOPRAM OXALATE 20 MG PO TABS
20.0000 mg | ORAL_TABLET | Freq: Every day | ORAL | 2 refills | Status: DC
  Filled 2022-02-01: qty 90, 90d supply, fill #0
  Filled 2022-05-15: qty 90, 90d supply, fill #1
  Filled 2022-08-13: qty 90, 90d supply, fill #2

## 2022-02-04 ENCOUNTER — Other Ambulatory Visit (HOSPITAL_COMMUNITY): Payer: Self-pay

## 2022-02-04 MED ORDER — PANTOPRAZOLE SODIUM 40 MG PO TBEC
40.0000 mg | DELAYED_RELEASE_TABLET | Freq: Every day | ORAL | 2 refills | Status: DC
  Filled 2022-02-04: qty 90, 90d supply, fill #0
  Filled 2022-05-15: qty 90, 90d supply, fill #1
  Filled 2022-08-13: qty 90, 90d supply, fill #2

## 2022-03-01 ENCOUNTER — Other Ambulatory Visit (HOSPITAL_COMMUNITY): Payer: Self-pay

## 2022-04-07 ENCOUNTER — Telehealth: Payer: 59 | Admitting: Nurse Practitioner

## 2022-04-07 ENCOUNTER — Ambulatory Visit: Admit: 2022-04-07 | Payer: 59

## 2022-04-07 DIAGNOSIS — J208 Acute bronchitis due to other specified organisms: Secondary | ICD-10-CM | POA: Diagnosis not present

## 2022-04-07 MED ORDER — BENZONATATE 100 MG PO CAPS: 100.0000 mg | ORAL_CAPSULE | Freq: Two times a day (BID) | ORAL | 0 refills | Status: DC | PRN

## 2022-04-07 NOTE — Progress Notes (Signed)
We are sorry that you are not feeling well.  Here is how we plan to help! ° °Based on your presentation I believe you most likely have A cough due to a virus.  This is called viral bronchitis and is best treated by rest, plenty of fluids and control of the cough.  You may use Ibuprofen or Tylenol as directed to help your symptoms.   °  °In addition you may use A prescription cough medication called Tessalon Perles 100mg. You may take 1-2 capsules every 8 hours as needed for your cough. ° ° °From your responses in the eVisit questionnaire you describe inflammation in the upper respiratory tract which is causing a significant cough.  This is commonly called Bronchitis and has four common causes:   °Allergies °Viral Infections °Acid Reflux °Bacterial Infection °Allergies, viruses and acid reflux are treated by controlling symptoms or eliminating the cause. An example might be a cough caused by taking certain blood pressure medications. You stop the cough by changing the medication. Another example might be a cough caused by acid reflux. Controlling the reflux helps control the cough. ° °USE OF BRONCHODILATOR ("RESCUE") INHALERS: °There is a risk from using your bronchodilator too frequently.  The risk is that over-reliance on a medication which only relaxes the muscles surrounding the breathing tubes can reduce the effectiveness of medications prescribed to reduce swelling and congestion of the tubes themselves.  Although you feel brief relief from the bronchodilator inhaler, your asthma may actually be worsening with the tubes becoming more swollen and filled with mucus.  This can delay other crucial treatments, such as oral steroid medications. If you need to use a bronchodilator inhaler daily, several times per day, you should discuss this with your provider.  There are probably better treatments that could be used to keep your asthma under control.  °   °HOME CARE °Only take medications as instructed by your  medical team. °Complete the entire course of an antibiotic. °Drink plenty of fluids and get plenty of rest. °Avoid close contacts especially the very young and the elderly °Cover your mouth if you cough or cough into your sleeve. °Always remember to wash your hands °A steam or ultrasonic humidifier can help congestion.  ° °GET HELP RIGHT AWAY IF: °You develop worsening fever. °You become short of breath °You cough up blood. °Your symptoms persist after you have completed your treatment plan °MAKE SURE YOU  °Understand these instructions. °Will watch your condition. °Will get help right away if you are not doing well or get worse. °  ° °Thank you for choosing an e-visit. ° °Your e-visit answers were reviewed by a board certified advanced clinical practitioner to complete your personal care plan. Depending upon the condition, your plan could have included both over the counter or prescription medications. ° °Please review your pharmacy choice. Make sure the pharmacy is open so you can pick up prescription now. If there is a problem, you may contact your provider through MyChart messaging and have the prescription routed to another pharmacy.  Your safety is important to us. If you have drug allergies check your prescription carefully.  ° °For the next 24 hours you can use MyChart to ask questions about today's visit, request a non-urgent call back, or ask for a work or school excuse. °You will get an email in the next two days asking about your experience. I hope that your e-visit has been valuable and will speed your recovery. ° °

## 2022-04-07 NOTE — Progress Notes (Signed)
I have spent 5 minutes in review of e-visit questionnaire, review and updating patient chart, medical decision making and response to patient.  ° °Rondale Nies W Aaran Enberg, NP ° °  °

## 2022-04-26 ENCOUNTER — Other Ambulatory Visit (HOSPITAL_COMMUNITY): Payer: Self-pay

## 2022-05-15 ENCOUNTER — Other Ambulatory Visit (HOSPITAL_COMMUNITY): Payer: Self-pay

## 2022-05-15 MED ORDER — TRULICITY 1.5 MG/0.5ML ~~LOC~~ SOAJ
1.5000 mg | SUBCUTANEOUS | 0 refills | Status: DC
  Filled 2022-05-15: qty 2, 28d supply, fill #0

## 2022-05-20 ENCOUNTER — Other Ambulatory Visit (HOSPITAL_COMMUNITY): Payer: Self-pay

## 2022-06-19 ENCOUNTER — Other Ambulatory Visit (HOSPITAL_COMMUNITY): Payer: Self-pay

## 2022-06-19 DIAGNOSIS — H524 Presbyopia: Secondary | ICD-10-CM | POA: Diagnosis not present

## 2022-06-19 DIAGNOSIS — E1165 Type 2 diabetes mellitus with hyperglycemia: Secondary | ICD-10-CM | POA: Diagnosis not present

## 2022-06-19 DIAGNOSIS — E559 Vitamin D deficiency, unspecified: Secondary | ICD-10-CM | POA: Diagnosis not present

## 2022-06-19 MED ORDER — TRULICITY 1.5 MG/0.5ML ~~LOC~~ SOAJ
1.5000 mg | SUBCUTANEOUS | 1 refills | Status: DC
  Filled 2022-06-19: qty 6, 84d supply, fill #0
  Filled 2022-09-13: qty 2, 28d supply, fill #1

## 2022-07-16 ENCOUNTER — Emergency Department (HOSPITAL_BASED_OUTPATIENT_CLINIC_OR_DEPARTMENT_OTHER): Payer: Commercial Managed Care - PPO | Admitting: Radiology

## 2022-07-16 ENCOUNTER — Emergency Department (HOSPITAL_BASED_OUTPATIENT_CLINIC_OR_DEPARTMENT_OTHER)
Admission: EM | Admit: 2022-07-16 | Discharge: 2022-07-16 | Disposition: A | Discharge status: Home / Self Care | Payer: Commercial Managed Care - PPO | Attending: Emergency Medicine | Admitting: Emergency Medicine

## 2022-07-16 ENCOUNTER — Encounter (HOSPITAL_BASED_OUTPATIENT_CLINIC_OR_DEPARTMENT_OTHER): Payer: Self-pay | Admitting: Emergency Medicine

## 2022-07-16 ENCOUNTER — Other Ambulatory Visit: Payer: Self-pay

## 2022-07-16 DIAGNOSIS — R002 Palpitations: Secondary | ICD-10-CM | POA: Diagnosis not present

## 2022-07-16 DIAGNOSIS — E119 Type 2 diabetes mellitus without complications: Secondary | ICD-10-CM | POA: Diagnosis not present

## 2022-07-16 DIAGNOSIS — Z7984 Long term (current) use of oral hypoglycemic drugs: Secondary | ICD-10-CM | POA: Diagnosis not present

## 2022-07-16 LAB — CBC
HCT: 38.7 % (ref 36.0–46.0)
Hemoglobin: 13.1 g/dL (ref 12.0–15.0)
MCH: 27.8 pg (ref 26.0–34.0)
MCHC: 33.9 g/dL (ref 30.0–36.0)
MCV: 82 fL (ref 80.0–100.0)
Platelets: 350 10*3/uL (ref 150–400)
RBC: 4.72 MIL/uL (ref 3.87–5.11)
RDW: 13.3 % (ref 11.5–15.5)
WBC: 13 10*3/uL — ABNORMAL HIGH (ref 4.0–10.5)
nRBC: 0 % (ref 0.0–0.2)

## 2022-07-16 LAB — BASIC METABOLIC PANEL
Anion gap: 9 (ref 5–15)
BUN: 11 mg/dL (ref 6–20)
CO2: 22 mmol/L (ref 22–32)
Calcium: 8.9 mg/dL (ref 8.9–10.3)
Chloride: 105 mmol/L (ref 98–111)
Creatinine, Ser: 0.6 mg/dL (ref 0.44–1.00)
GFR, Estimated: >60 mL/min (ref >60)
Glucose, Bld: 167 mg/dL — ABNORMAL HIGH (ref 70–99)
Potassium: 3.7 mmol/L (ref 3.5–5.1)
Sodium: 136 mmol/L (ref 135–145)

## 2022-07-16 LAB — TROPONIN I (HIGH SENSITIVITY)
Troponin I (High Sensitivity): <2 ng/L (ref <18)
Troponin I (High Sensitivity): <2 ng/L (ref <18)

## 2022-07-16 LAB — TSH: TSH: 1.969 u[IU]/mL (ref 0.350–4.500)

## 2022-07-16 LAB — MAGNESIUM: Magnesium: 2 mg/dL (ref 1.7–2.4)

## 2022-07-16 LAB — PREGNANCY, URINE: Preg Test, Ur: NEGATIVE

## 2022-07-16 NOTE — Discharge Instructions (Addendum)
You came to the emergency department with palpitations.  Your electrolytes are within normal limits.  Your thyroid test was also normal.  Follow-up with cardiology as intended.  As we discussed, keep a diary of these episodes and decrease your caffeine intake.  It was a pleasure to meet you and we hope you feel better.  Do not hesitate to return with any worsening symptoms.

## 2022-07-16 NOTE — ED Provider Notes (Signed)
[facility] Provider Note   CSN: [id number] Arrival date & time: 07/16/22  1538     History  Chief Complaint  Patient presents with   Palpitations    [name] is a 47 y.o. female with a past medical history of type 2 diabetes and SVT presenting today with palpitations.  She reports that ever since Friday she has felt like she is "dropping beats."  She says that it lasts for multiple hours and "feels like flops."  No associated pain or shortness of breath.  Has a history of SVT but it feels nothing like that.  Has not seen Dr. Stanford Breed in multiple years.  Denies daily energy drink, no nicotine, no history of DVT/PE, no recent travel or surgery.    Palpitations Associated symptoms: no chest pain, no cough, no dizziness, no shortness of breath and no weakness        Home Medications Prior to Admission medications   Medication Sig Start Date End Date Taking? Authorizing Provider  azelastine (ASTELIN) 0.1 % nasal spray Place 1 spray into both nostrils 2 (two) times daily. Use in each nostril as directed 04/09/18   Shella Maxim, NP  benzonatate (TESSALON) 100 MG capsule Take 1-2 capsules (100-200 mg total) by mouth 2 (two) times daily as needed for cough. 04/07/22   Gildardo Pounds, NP  Blood Glucose Monitoring Suppl (FREESTYLE LITE) w/Device KIT Use as directed 09/25/20     calcium-vitamin D (OSCAL WITH D) 500-200 MG-UNIT tablet Take 1 tablet by mouth.    [provider]  cholecalciferol (VITAMIN D3) 25 MCG (1000 UT) tablet Take 5,000 Units by mouth daily.    [provider]  COVID-19 At Home Antigen Test (QUICKVUE AT-HOME COVID-19 TEST) KIT Use as directed 12/19/20   Jefm Bryant, RPH  cyclobenzaprine (FLEXERIL) 10 MG tablet Take 1 tablet (10 mg total) by mouth 2 (two) times daily as needed for muscle spasms. 11/03/20   Wurst, [place], PA-C  Dulaglutide (TRULICITY) A999333 0000000 SOPN Inject 0.75 mg into the skin once a  week. 08/21/21     Dulaglutide (TRULICITY) A999333 0000000 SOPN Inject 0.75 mg into the skin once a week. 08/21/21     Dulaglutide (TRULICITY) 1.5 0000000 SOPN Inject 1.5 mg into the skin once a week. 06/19/22     escitalopram (LEXAPRO) 20 MG tablet Take 1 tablet (20 mg total) by mouth daily. 02/01/22     fluconazole (DIFLUCAN) 150 MG tablet Take 1 tablet (150 mg total) by mouth once 10/12/20     FLUoxetine (PROZAC) 10 MG tablet TAKE 1 TABLET BY MOUTH ONCE A DAY 12/02/19 12/01/20  Paula Compton, MD  fluticasone [facility]) 50 MCG/ACT nasal spray Place 2 sprays into both nostrils daily. 03/22/20   Evelina Dun A, FNP  glucose blood (ONETOUCH VERIO) test strip Test 2 times daily 09/20/20     glucose blood test strip Use as directed twice daily 09/25/20     glucose blood test strip Use to check blood sugar 2 times daily. 12/18/21     Lancets (FREESTYLE) lancets Use as directed 2 times daily 12/27/21     levonorgestrel (MIRENA) 20 MCG/24HR IUD 1 each by Intrauterine route once.    [provider]  metFORMIN (GLUCOPHAGE) 500 MG tablet 1,000 mg 2 (two) times daily with a meal.  07/18/17   [provider]  metFORMIN (GLUCOPHAGE-XR) 500 MG 24 hr tablet Take 1 tablet (500 mg total) by mouth daily. 06/14/21  metFORMIN (GLUCOPHAGE-XR) 500 MG 24 hr tablet Take 1 tablet (500 mg total) by mouth 2 (two) times daily. 11/30/21     pantoprazole (PROTONIX) 40 MG tablet pantoprazole 40 mg tablet,delayed release    [provider]  pantoprazole (PROTONIX) 40 MG tablet TAKE 1 TABLET BY MOUTH ONCE DAILY 02/14/20 02/13/21  Paula Compton, MD  pantoprazole (PROTONIX) 40 MG tablet Take 1 tablet (40 mg total) by mouth daily. 10/23/20     pantoprazole (PROTONIX) 40 MG tablet Take 1 tablet (40 mg total) by mouth daily. 02/04/22     tamsulosin (FLOMAX) 0.4 MG CAPS capsule Take 1 capsule (0.4 mg total) by mouth daily after breakfast. 10/12/20   Wurst, [place], PA-C  tirzepatide [facility]) 5 MG/0.5ML Pen Inject 5 mg  into the skin once a week. 06/14/21     esomeprazole (NEXIUM) 40 MG capsule Take 40 mg by mouth daily before breakfast.  10/14/18  [provider]  metoprolol succinate (TOPROL-XL) 50 MG 24 hr tablet Take 25 mg by mouth daily. 1/2 tablet daily 07/26/11 10/14/18  Lelon Perla, MD      Allergies    Erythromycin base    Review of Systems   Review of Systems  Respiratory:  Negative for cough, chest tightness and shortness of breath.   Cardiovascular:  Positive for palpitations. Negative for chest pain and leg swelling.  Neurological:  Negative for dizziness, syncope, weakness and light-headedness.    Physical Exam Updated Vital Signs BP (!) 152/86 (BP Location: Right Wrist)   Pulse 86   Temp (!) 97.2 F (36.2 C)   Resp 18   Ht 5\' 6"  (1.676 m)   Wt 132 kg   SpO2 98%   BMI 46.97 kg/m  Physical Exam Vitals and nursing note reviewed.  Constitutional:      General: She is not in acute distress.    Appearance: Normal appearance. She is not ill-appearing.  HENT:     Head: Normocephalic and atraumatic.  Eyes:     General: No scleral icterus.    Conjunctiva/sclera: Conjunctivae normal.  Cardiovascular:     Rate and Rhythm: Normal rate and regular rhythm.  Pulmonary:     Effort: Pulmonary effort is normal. No respiratory distress.     Breath sounds: No wheezing or rales.  Skin:    Findings: No rash.  Neurological:     Mental Status: She is alert.  Psychiatric:        Mood and Affect: Mood normal.     ED Results / Procedures / Treatments   Labs (all labs ordered are listed, but only abnormal results are displayed) Labs Reviewed  BASIC METABOLIC PANEL - Abnormal; Notable for the following components:      Result Value   Glucose, Bld 167 (*)    All other components within normal limits  CBC - Abnormal; Notable for the following components:   WBC 13.0 (*)    All other components within normal limits  PREGNANCY, URINE  TROPONIN I (HIGH SENSITIVITY)  TROPONIN I  (HIGH SENSITIVITY)    EKG None  Radiology DG Chest 2 View  Result Date: 07/16/2022 CLINICAL DATA:  Palpitations EXAM: CHEST - 2 VIEW COMPARISON:  Radiograph 03/24/2013 FINDINGS: The heart size and mediastinal contours are within normal limits.No focal airspace disease. No pleural effusion or pneumothorax.No acute osseous abnormality. IMPRESSION: No evidence of acute cardiopulmonary disease. Electronically Signed   By: Maurine Simmering M.D.   On: 07/16/2022 16:55    Procedures Procedures   Medications  Ordered in ED Medications - No data to display  ED Course/ Medical Decision Making/ A&P                             Medical Decision Making Amount and/or Complexity of Data Reviewed Labs: ordered. Radiology: ordered.   47 year old female presenting with palpitations.  Differential includes but is not limited to electrolyte abnormality, thyroid abnormality, alcohol withdrawals, intoxication, arrhythmia.  This is not an exhaustive differential.    Past Medical History / Co-morbidities / Social History: SVT, daily caffeine use   Physical Exam: Unremarkable  Lab Tests: I ordered, and personally interpreted labs.  The pertinent results include: Normal electrolytes Normal TSH D-dimer considered however patient is low likelihood Wells   Imaging Studies: I ordered and independently visualized and interpreted chest x-ray and I agree with the radiologist that there are no acute findings   Cardiac Monitoring:  The patient was maintained on a cardiac monitor.  I viewed and interpreted the cardiac monitored which showed an underlying rhythm of: Normal sinus with a normal rate   Medications: Asymptomatic currently, no beta-blocker/CCB indicated   MDM/Disposition: This is a 47 year old female who presented today with palpitations.  She said it felt as though she were dropping beats.  Differential was broad and inclusive of electrolyte abnormality, anxiety, thyroid abnormality and more.   ACS workup is negative.  Heart score is 2.  Do not believe she needs emergent cardiology consultation.  Electrolytes within normal limits.  TSH within normal limits as well.  Do believe that she could be having a reaction to caffeine as she tells me that she has also started drinking sodas with sugar more recently.  She was instructed to cut this out of her diet and to keep a diary of what is happening when she has these episodes.  PERC negative.  She will be given referral to cardiology although she also tells me that she has an appointment with Dr. Stanford Breed in a couple of weeks.  Strict return precautions were given and she was discharged in stable condition   Final Clinical Impression(s) / ED Diagnoses Final diagnoses:  Palpitations    Rx / DC Orders ED Discharge Orders          Ordered    Ambulatory referral to Cardiology       Comments: If you have not heard from the Cardiology office within the next 72 hours please call [phone].   07/16/22 1849           Results and diagnoses were explained to the patient. Return precautions discussed in full. Patient had no additional questions and expressed complete understanding.   This chart was dictated using voice recognition software.  Despite best efforts to proofread,  errors can occur which can change the documentation meaning.     Rhae Hammock, PA-C 07/16/22 2059    Lorelle Gibbs, DO 07/16/22 2333

## 2022-07-16 NOTE — ED Triage Notes (Addendum)
Pt via pv from home with palpitations. She reports that she can feel her heart "dropping beats" when she checks her pulse. She reports that it started on Saturday and continued Sunday intermittently. It started again about 1.5 hours PTA. Pt has hx of some cardiac issues (never determined what that was). Pt hasn't seen her cardiologist in 10 years because all issues stopped until Saturday. Denies sob, n/v; endorses headache. Pt alert & oriented, nad noted.

## 2022-07-16 NOTE — ED Notes (Signed)
Patient ready for dispo, no complaints voiced

## 2022-07-29 ENCOUNTER — Encounter: Payer: Self-pay | Admitting: Cardiology

## 2022-08-07 NOTE — Progress Notes (Signed)
Referring-Madison Redwine, PA-C Reason for referral-palpitations  HPI: 47 year old female for evaluation of palpitations at request of Madison Redwine, PA-C.  I have seen previously but not since 2013.  She has a history of palpitations and felt possibly secondary to SVT in the past but not documented.  Echocardiogram April 2013 showed normal LV function.  Patient seen with complaints of palpitations April 2024.  Chest x-ray without acute infiltrates.  Magnesium 2, troponins normal, TSH 1.969, potassium 3.7, creatinine 0.60, hemoglobin 13.1.  Cardiology now asked to evaluate.  Patient notes that she has occasional palpitations.  They are described as a "pause".  She does not have sustained palpitations.  There is no dyspnea, chest pain or syncope.  Cardiology now asked to evaluate.  Current Outpatient Medications  Medication Sig Dispense Refill   azelastine (ASTELIN) 0.1 % nasal spray Place 1 spray into both nostrils 2 (two) times daily. Use in each nostril as directed 30 mL 0   benzonatate (TESSALON) 100 MG capsule Take 1-2 capsules (100-200 mg total) by mouth 2 (two) times daily as needed for cough. 40 capsule 0   Blood Glucose Monitoring Suppl (FREESTYLE LITE) w/Device KIT Use as directed 1 kit 1   cholecalciferol (VITAMIN D3) 25 MCG (1000 UT) tablet Take 5,000 Units by mouth daily.     COVID-19 At Home Antigen Test (QUICKVUE AT-HOME COVID-19 TEST) KIT Use as directed 2 each 0   cyclobenzaprine (FLEXERIL) 10 MG tablet Take 1 tablet (10 mg total) by mouth 2 (two) times daily as needed for muscle spasms. 20 tablet 0   Dulaglutide (TRULICITY) 1.5 MG/0.5ML SOPN Inject 1.5 mg into the skin once a week. 6 mL 1   escitalopram (LEXAPRO) 20 MG tablet Take 1 tablet (20 mg total) by mouth daily. 90 tablet 2   glucose blood test strip Use as directed twice daily 100 each 6   glucose blood test strip Use to check blood sugar 2 times daily. 100 each 4   Lancets (FREESTYLE) lancets Use as directed 2  times daily 100 each 6   levonorgestrel (MIRENA) 20 MCG/24HR IUD 1 each by Intrauterine route once.     metFORMIN (GLUCOPHAGE-XR) 500 MG 24 hr tablet Take 1 tablet (500 mg total) by mouth 2 (two) times daily. 180 tablet 4   pantoprazole (PROTONIX) 40 MG tablet Take 1 tablet (40 mg total) by mouth daily. 90 tablet 2   No current facility-administered medications for this visit.    Allergies  Allergen Reactions   Erythromycin Base      Past Medical History:  Diagnosis Date   Back pain    Diabetes mellitus (HCC)    GERD (gastroesophageal reflux disease)    Palpitations    PONV (postoperative nausea and vomiting)    Spinal headache     Past Surgical History:  Procedure Laterality Date   CESAREAN SECTION     Left knee arthroscopic surgery     WISDOM TOOTH EXTRACTION      Social History   Socioeconomic History   Marital status: Divorced    Spouse name: Not on file   Number of children: 2   Years of education: Not on file   Highest education level: Not on file  Occupational History    Employer: [employer]  Tobacco Use   Smoking status: Former    Types: Cigarettes    Quit date: 08/03/2002    Years since quitting: 20.0   Smokeless tobacco: Never  Vaping Use   Vaping  Use: Never used  Substance and Sexual Activity   Alcohol use: No   Drug use: No   Sexual activity: Not on file  Other Topics Concern   Not on file  Social History Narrative   Not on file   Social Determinants of Health   Financial Resource Strain: Not on file  Food Insecurity: Not on file  Transportation Needs: Not on file  Physical Activity: Not on file  Stress: Not on file  Social Connections: Not on file  Intimate Partner Violence: Not on file    Family History  Problem Relation Age of Onset   Hypertension Mother    Stroke Mother    Anesthesia problems Mother    Hypertension Father    Anesthesia problems Sister     ROS: no fevers or chills, productive cough, hemoptysis,  dysphasia, odynophagia, melena, hematochezia, dysuria, hematuria, rash, seizure activity, orthopnea, PND, pedal edema, claudication. Remaining systems are negative.  Physical Exam:   Blood pressure 134/80, pulse 68, height 5' 6.5" (1.689 m), weight 296 lb (134.3 kg), SpO2 100 %.  General:  Well developed/well nourished in NAD Skin warm/dry Patient not depressed No peripheral clubbing Back-normal HEENT-normal/normal eyelids Neck supple/normal carotid upstroke bilaterally; no bruits; no JVD; no thyromegaly chest - CTA/ normal expansion CV - RRR/normal S1 and S2; no murmurs, rubs or gallops;  PMI nondisplaced Abdomen -NT/ND, no HSM, no mass, + bowel sounds, no bruit 2+ femoral pulses, no bruits Ext-no edema, chords, 2+ DP Neuro-grossly nonfocal  ECG -July 16, 2022-normal sinus rhythm with no significant ST changes.  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  Will arrange 7-day Zio patch and echocardiogram.  We discussed a beta-blocker today.  If her symptoms worsen she will contact us and we will begin metoprolol at that time.  2 diabetes mellitus-managed by endocrinology.  Olga Millers, MD

## 2022-08-13 ENCOUNTER — Encounter: Payer: Self-pay | Admitting: Cardiology

## 2022-08-13 ENCOUNTER — Ambulatory Visit: Payer: Commercial Managed Care - PPO | Attending: Cardiology

## 2022-08-13 ENCOUNTER — Ambulatory Visit: Payer: Commercial Managed Care - PPO | Attending: Cardiology | Admitting: Cardiology

## 2022-08-13 VITALS — BP 134/80 | HR 68 | Ht 66.5 in | Wt 296.0 lb

## 2022-08-13 DIAGNOSIS — R002 Palpitations: Secondary | ICD-10-CM | POA: Diagnosis not present

## 2022-08-13 NOTE — Progress Notes (Unsigned)
Enrolled for [employer] to mail a ZIO XT long term holter monitor to the patients address on file.  

## 2022-08-13 NOTE — Patient Instructions (Signed)
Testing/Procedures:  Your physician has requested that you have an echocardiogram. Echocardiography is a painless test that uses sound waves to create images of your heart. It provides your doctor with information about the size and shape of your heart and how well your heart's chambers and valves are working. This procedure takes approximately one hour. There are no restrictions for this procedure. Please do NOT wear cologne, perfume, aftershave, or lotions (deodorant is allowed). Please arrive 15 minutes prior to your appointment time. [address]   ZIO XT- Long Term Monitor Instructions  Your physician has requested you wear a ZIO patch monitor for 7 days.  This is a single patch monitor. [employer] supplies one patch monitor per enrollment. Additional stickers are not available. Please do not apply patch if you will be having a Nuclear Stress Test,  Echocardiogram, Cardiac CT, MRI, or Chest Xray during the period you would be wearing the  monitor. The patch cannot be worn during these tests. You cannot remove and re-apply the  ZIO XT patch monitor.  Your ZIO patch monitor will be mailed 3 day USPS to your address on file. It may take 3-5 days  to receive your monitor after you have been enrolled.  Once you have received your monitor, please review the enclosed instructions. Your monitor  has already been registered assigning a specific monitor serial # to you.  Billing and Patient Assistance Program Information  We have supplied [employer] with any of your insurance information on file for billing purposes. [employer] offers a sliding scale Patient Assistance Program for patients that do not have  insurance, or whose insurance does not completely cover the cost of the ZIO monitor.  You must apply for the Patient Assistance Program to qualify for this discounted rate.  To apply, please call [employer] at [phone], select option 4, select option 2, ask to apply for  Patient  Assistance Program. Meredeth Ide will ask your household income, and how many people  are in your household. They will quote your out-of-pocket cost based on that information.  [employer] will also be able to set up a 36-month, interest-free payment plan if needed.  Applying the monitor   Shave hair from upper left chest.  Hold abrader disc by orange tab. Rub abrader in 40 strokes over the upper left chest as  indicated in your monitor instructions.  Clean area with 4 enclosed alcohol pads. Let dry.  Apply patch as indicated in monitor instructions. Patch will be placed under collarbone on left  side of chest with arrow pointing upward.  Rub patch adhesive wings for 2 minutes. Remove white label marked "1". Remove the white  label marked "2". Rub patch adhesive wings for 2 additional minutes.  While looking in a mirror, press and release button in center of patch. A small green light will  flash 3-4 times. This will be your only indicator that the monitor has been turned on.  Do not shower for the first 24 hours. You may shower after the first 24 hours.  Press the button if you feel a symptom. You will hear a small click. Record Date, Time and  Symptom in the Patient Logbook.  When you are ready to remove the patch, follow instructions on the last 2 pages of Patient  Logbook. Stick patch monitor onto the last page of Patient Logbook.  Place Patient Logbook in the blue and white box. Use locking tab on box and tape box closed  securely. The blue and white  box has prepaid postage on it. Please place it in the mailbox as  soon as possible. Your physician should have your test results approximately 7 days after the  monitor has been mailed back to [facility].  Call [facility] Customer Care at [phone] if you have questions regarding  your ZIO XT patch monitor. Call them immediately if you see an orange light blinking on your  monitor.  If your monitor falls off in less than 4 days,  contact our Monitor department at [phone].  If your monitor becomes loose or falls off after 4 days call [employer] at [phone] for  suggestions on securing your monitor    Follow-Up: At [facility], you and your health needs are our priority.  As part of our continuing mission to provide you with exceptional heart care, we have created designated Provider Care Teams.  These Care Teams include your primary Cardiologist (physician) and Advanced Practice Providers (APPs -  Physician Assistants and Nurse Practitioners) who all work together to provide you with the care you need, when you need it.  We recommend signing up for the patient portal called "MyChart".  Sign up information is provided on this After Visit Summary.  MyChart is used to connect with patients for Virtual Visits (Telemedicine).  Patients are able to view lab/test results, encounter notes, upcoming appointments, etc.  Non-urgent messages can be sent to your provider as well.   To learn more about what you can do with MyChart, go to [email or web address].    Your next appointment:   6 month(s)  Provider:   Olga Millers MD

## 2022-08-17 DIAGNOSIS — R002 Palpitations: Secondary | ICD-10-CM

## 2022-08-29 DIAGNOSIS — R002 Palpitations: Secondary | ICD-10-CM | POA: Diagnosis not present

## 2022-08-30 ENCOUNTER — Encounter: Payer: Self-pay | Admitting: *Deleted

## 2022-09-12 ENCOUNTER — Ambulatory Visit (HOSPITAL_COMMUNITY): Payer: Commercial Managed Care - PPO | Attending: Cardiology

## 2022-09-12 DIAGNOSIS — R002 Palpitations: Secondary | ICD-10-CM | POA: Diagnosis not present

## 2022-09-12 LAB — ECHOCARDIOGRAM COMPLETE
Area-P 1/2: 3.03 cm2
S' Lateral: 3.3 cm

## 2022-09-13 ENCOUNTER — Other Ambulatory Visit (HOSPITAL_COMMUNITY): Payer: Self-pay

## 2022-09-13 ENCOUNTER — Other Ambulatory Visit: Payer: Self-pay

## 2022-09-13 MED ORDER — TRULICITY 3 MG/0.5ML ~~LOC~~ SOAJ
3.0000 mg | SUBCUTANEOUS | 5 refills | Status: DC
  Filled 2022-09-13 – 2022-11-15 (×2): qty 2, 28d supply, fill #0
  Filled 2022-12-02: qty 2, 28d supply, fill #1
  Filled 2022-12-09: qty 6, 84d supply, fill #1

## 2022-10-08 DIAGNOSIS — Z124 Encounter for screening for malignant neoplasm of cervix: Secondary | ICD-10-CM | POA: Diagnosis not present

## 2022-10-08 DIAGNOSIS — Z01419 Encounter for gynecological examination (general) (routine) without abnormal findings: Secondary | ICD-10-CM | POA: Diagnosis not present

## 2022-10-08 DIAGNOSIS — Z1151 Encounter for screening for human papillomavirus (HPV): Secondary | ICD-10-CM | POA: Diagnosis not present

## 2022-10-08 DIAGNOSIS — Z1231 Encounter for screening mammogram for malignant neoplasm of breast: Secondary | ICD-10-CM | POA: Diagnosis not present

## 2022-10-08 DIAGNOSIS — Z130 Encounter for screening for diseases of the blood and blood-forming organs and certain disorders involving the immune mechanism: Secondary | ICD-10-CM | POA: Diagnosis not present

## 2022-10-08 DIAGNOSIS — Z1389 Encounter for screening for other disorder: Secondary | ICD-10-CM | POA: Diagnosis not present

## 2022-10-09 DIAGNOSIS — Z01419 Encounter for gynecological examination (general) (routine) without abnormal findings: Secondary | ICD-10-CM | POA: Diagnosis not present

## 2022-10-09 DIAGNOSIS — Z1389 Encounter for screening for other disorder: Secondary | ICD-10-CM | POA: Diagnosis not present

## 2022-10-20 ENCOUNTER — Encounter: Payer: Self-pay | Admitting: Cardiology

## 2022-10-21 ENCOUNTER — Other Ambulatory Visit (HOSPITAL_COMMUNITY): Payer: Self-pay

## 2022-10-21 MED ORDER — METOPROLOL SUCCINATE ER 25 MG PO TB24
25.0000 mg | ORAL_TABLET | Freq: Every day | ORAL | 3 refills | Status: DC | PRN
  Filled 2022-10-21 – 2022-11-08 (×2): qty 30, 30d supply, fill #0
  Filled 2023-03-14: qty 90, 90d supply, fill #1

## 2022-10-21 NOTE — Telephone Encounter (Signed)
Toprol 25 mg daily as needed. Olga Millers

## 2022-10-25 ENCOUNTER — Other Ambulatory Visit (HOSPITAL_COMMUNITY): Payer: Self-pay

## 2022-11-08 ENCOUNTER — Other Ambulatory Visit (HOSPITAL_COMMUNITY): Payer: Self-pay

## 2022-11-15 ENCOUNTER — Other Ambulatory Visit (HOSPITAL_COMMUNITY): Payer: Self-pay

## 2022-12-02 ENCOUNTER — Other Ambulatory Visit (HOSPITAL_COMMUNITY): Payer: Self-pay

## 2022-12-09 ENCOUNTER — Other Ambulatory Visit (HOSPITAL_COMMUNITY): Payer: Self-pay

## 2022-12-09 ENCOUNTER — Other Ambulatory Visit: Payer: Self-pay

## 2022-12-10 ENCOUNTER — Other Ambulatory Visit (HOSPITAL_COMMUNITY): Payer: Self-pay

## 2022-12-20 DIAGNOSIS — E559 Vitamin D deficiency, unspecified: Secondary | ICD-10-CM | POA: Diagnosis not present

## 2022-12-20 DIAGNOSIS — E1165 Type 2 diabetes mellitus with hyperglycemia: Secondary | ICD-10-CM | POA: Diagnosis not present

## 2022-12-27 ENCOUNTER — Other Ambulatory Visit (HOSPITAL_COMMUNITY): Payer: Self-pay

## 2022-12-27 DIAGNOSIS — E559 Vitamin D deficiency, unspecified: Secondary | ICD-10-CM | POA: Diagnosis not present

## 2022-12-27 DIAGNOSIS — E1165 Type 2 diabetes mellitus with hyperglycemia: Secondary | ICD-10-CM | POA: Diagnosis not present

## 2022-12-27 DIAGNOSIS — E7800 Pure hypercholesterolemia, unspecified: Secondary | ICD-10-CM | POA: Diagnosis not present

## 2022-12-27 MED ORDER — PANTOPRAZOLE SODIUM 40 MG PO TBEC
40.0000 mg | DELAYED_RELEASE_TABLET | Freq: Every day | ORAL | 2 refills | Status: DC
  Filled 2022-12-27: qty 90, 90d supply, fill #0
  Filled 2023-04-10: qty 90, 90d supply, fill #1
  Filled 2023-07-04: qty 90, 90d supply, fill #2

## 2022-12-27 MED ORDER — ESCITALOPRAM OXALATE 20 MG PO TABS
20.0000 mg | ORAL_TABLET | Freq: Every day | ORAL | 2 refills | Status: DC
  Filled 2022-12-27: qty 90, 90d supply, fill #0
  Filled 2023-04-10: qty 90, 90d supply, fill #1
  Filled 2023-07-04: qty 90, 90d supply, fill #2

## 2022-12-27 MED ORDER — TRULICITY 4.5 MG/0.5ML ~~LOC~~ SOAJ
4.5000 mg | SUBCUTANEOUS | 5 refills | Status: DC
  Filled 2022-12-27 – 2023-02-18 (×2): qty 2, 28d supply, fill #0
  Filled 2023-03-14: qty 2, 28d supply, fill #1
  Filled 2023-03-27 – 2023-04-10 (×2): qty 2, 28d supply, fill #2

## 2023-01-02 ENCOUNTER — Other Ambulatory Visit (HOSPITAL_COMMUNITY): Payer: Self-pay

## 2023-02-05 ENCOUNTER — Other Ambulatory Visit (HOSPITAL_COMMUNITY): Payer: Self-pay

## 2023-02-05 MED ORDER — INFLUENZA VIRUS VACC SPLIT PF (FLUZONE) 0.5 ML IM SUSY
0.5000 mL | PREFILLED_SYRINGE | Freq: Once | INTRAMUSCULAR | 0 refills | Status: AC
  Filled 2023-02-05: qty 0.5, 1d supply, fill #0

## 2023-02-10 NOTE — Progress Notes (Signed)
HPI: Follow-up palpitations. She has a history of palpitations and felt possibly secondary to SVT in the past but not documented. Monitor May 2024 showed sinus rhythm with occasional PACs, short run of SVT (11 beats) and PVCs.  Echocardiogram May 2024 showed normal LV function.  Since last seen she continues to have occasional palpitations that she attributes to stress.  She denies dyspnea, chest pain or syncope.  She did will take Toprol as needed.  Current Outpatient Medications  Medication Sig Dispense Refill   azelastine (ASTELIN) 0.1 % nasal spray Place 1 spray into both nostrils 2 (two) times daily. Use in each nostril as directed 30 mL 0   Blood Glucose Monitoring Suppl (FREESTYLE LITE) w/Device KIT Use as directed 1 kit 1   cholecalciferol (VITAMIN D3) 25 MCG (1000 UT) tablet Take 5,000 Units by mouth daily.     COVID-19 At Home Antigen Test (QUICKVUE AT-HOME COVID-19 TEST) KIT Use as directed 2 each 0   cyclobenzaprine (FLEXERIL) 10 MG tablet Take 1 tablet (10 mg total) by mouth 2 (two) times daily as needed for muscle spasms. 20 tablet 0   Dulaglutide (TRULICITY) 3 MG/0.5ML SOPN Inject 3 mg into the skin once a week. 2 mL 5   Dulaglutide (TRULICITY) 4.5 MG/0.5ML SOPN Take 4.5 mg by mouth once a week. 2 mL 5   escitalopram (LEXAPRO) 20 MG tablet Take 1 tablet (20 mg total) by mouth daily. 90 tablet 2   glucose blood test strip Use as directed twice daily 100 each 6   glucose blood test strip Use to check blood sugar 2 times daily. 100 each 4   Lancets (FREESTYLE) lancets Use as directed 2 times daily 100 each 6   levonorgestrel (MIRENA) 20 MCG/24HR IUD 1 each by Intrauterine route once.     metFORMIN (GLUCOPHAGE-XR) 500 MG 24 hr tablet Take 1 tablet (500 mg total) by mouth 2 (two) times daily. 180 tablet 4   metoprolol succinate (TOPROL-XL) 25 MG 24 hr tablet Take 1 tablet (25 mg total) by mouth daily as needed. 30 tablet 3   pantoprazole (PROTONIX) 40 MG tablet Take 1 tablet (40  mg total) by mouth daily. 90 tablet 2   benzonatate (TESSALON) 100 MG capsule Take 1-2 capsules (100-200 mg total) by mouth 2 (two) times daily as needed for cough. (Patient not taking: Reported on 02/14/2023) 40 capsule 0   Dulaglutide (TRULICITY) 1.5 MG/0.5ML SOPN Inject 1.5 mg into the skin once a week. (Patient not taking: Reported on 02/14/2023) 6 mL 1   No current facility-administered medications for this visit.     Past Medical History:  Diagnosis Date   Back pain    Diabetes mellitus (HCC)    GERD (gastroesophageal reflux disease)    Palpitations    PONV (postoperative nausea and vomiting)    Spinal headache     Past Surgical History:  Procedure Laterality Date   CESAREAN SECTION     Left knee arthroscopic surgery     WISDOM TOOTH EXTRACTION      Social History   Socioeconomic History   Marital status: Divorced    Spouse name: Not on file   Number of children: 2   Years of education: Not on file   Highest education level: Not on file  Occupational History    Employer: [employer]  Tobacco Use   Smoking status: Former    Current packs/day: 0.00    Types: Cigarettes    Quit date: 08/03/2002  Years since quitting: 20.5   Smokeless tobacco: Never  Vaping Use   Vaping status: Never Used  Substance and Sexual Activity   Alcohol use: No   Drug use: No   Sexual activity: Not on file  Other Topics Concern   Not on file  Social History Narrative   Not on file   Social Determinants of Health   Financial Resource Strain: Not on file  Food Insecurity: Not on file  Transportation Needs: Not on file  Physical Activity: Not on file  Stress: Not on file  Social Connections: Not on file  Intimate Partner Violence: Not on file    Family History  Problem Relation Age of Onset   Hypertension Mother    Stroke Mother    Anesthesia problems Mother    Hypertension Father    Anesthesia problems Sister     ROS: no fevers or chills, productive cough,  hemoptysis, dysphasia, odynophagia, melena, hematochezia, dysuria, hematuria, rash, seizure activity, orthopnea, PND, pedal edema, claudication. Remaining systems are negative.  Physical Exam: Well-developed well-nourished in no acute distress.  Skin is warm and dry.  HEENT is normal.  Neck is supple.  Chest is clear to auscultation with normal expansion.  Cardiovascular exam is regular rate and rhythm.  Abdominal exam nontender or distended. No masses palpated. Extremities show no edema. neuro grossly intact   A/P  1 palpitations-symptoms are relatively unchanged.  She will continue Toprol as needed.  This can be advanced in the future if needed.  Note her LV function is normal.  2 diabetes mellitus-managed by endocrinology.  Olga Millers, MD

## 2023-02-14 ENCOUNTER — Encounter: Payer: Self-pay | Admitting: Cardiology

## 2023-02-14 ENCOUNTER — Ambulatory Visit: Payer: Commercial Managed Care - PPO | Attending: Cardiology | Admitting: Cardiology

## 2023-02-14 VITALS — BP 136/87 | HR 75 | Ht 67.0 in | Wt 298.0 lb

## 2023-02-14 DIAGNOSIS — R002 Palpitations: Secondary | ICD-10-CM | POA: Diagnosis not present

## 2023-02-14 NOTE — Patient Instructions (Signed)
    Follow-Up: At [facility], you and your health needs are our priority.  As part of our continuing mission to provide you with exceptional heart care, we have created designated Provider Care Teams.  These Care Teams include your primary Cardiologist (physician) and Advanced Practice Providers (APPs -  Physician Assistants and Nurse Practitioners) who all work together to provide you with the care you need, when you need it.  We recommend signing up for the patient portal called "MyChart".  Sign up information is provided on this After Visit Summary.  MyChart is used to connect with patients for Virtual Visits (Telemedicine).  Patients are able to view lab/test results, encounter notes, upcoming appointments, etc.  Non-urgent messages can be sent to your provider as well.   To learn more about what you can do with MyChart, go to [email or web address].    Your next appointment:   12 month(s)  Provider:   BRIAN CRENSHAW MD    

## 2023-02-18 ENCOUNTER — Other Ambulatory Visit (HOSPITAL_COMMUNITY): Payer: Self-pay

## 2023-03-14 ENCOUNTER — Encounter (HOSPITAL_COMMUNITY): Payer: Self-pay

## 2023-03-14 ENCOUNTER — Encounter: Payer: Self-pay | Admitting: Cardiology

## 2023-03-14 ENCOUNTER — Other Ambulatory Visit (HOSPITAL_COMMUNITY): Payer: Self-pay

## 2023-03-17 ENCOUNTER — Other Ambulatory Visit (HOSPITAL_COMMUNITY): Payer: Self-pay

## 2023-03-17 MED ORDER — METOPROLOL SUCCINATE ER 25 MG PO TB24
25.0000 mg | ORAL_TABLET | Freq: Every day | ORAL | 3 refills | Status: AC
Start: 2023-03-17 — End: ?
  Filled 2023-03-17 – 2023-07-04 (×2): qty 90, 90d supply, fill #0
  Filled 2023-11-09: qty 90, 90d supply, fill #1
  Filled 2024-02-03: qty 90, 90d supply, fill #2

## 2023-03-27 ENCOUNTER — Other Ambulatory Visit (HOSPITAL_COMMUNITY): Payer: Self-pay

## 2023-04-10 ENCOUNTER — Other Ambulatory Visit: Payer: Self-pay

## 2023-04-10 ENCOUNTER — Other Ambulatory Visit (HOSPITAL_COMMUNITY): Payer: Self-pay

## 2023-04-10 MED ORDER — GLUCOSE BLOOD VI STRP
ORAL_STRIP | Freq: Two times a day (BID) | 4 refills | Status: AC
  Filled 2023-04-10: qty 100, 50d supply, fill #0

## 2023-04-10 MED ORDER — METFORMIN HCL ER 500 MG PO TB24
500.0000 mg | ORAL_TABLET | Freq: Two times a day (BID) | ORAL | 4 refills | Status: AC
  Filled 2023-04-10: qty 180, 90d supply, fill #0
  Filled 2023-07-04: qty 180, 90d supply, fill #1
  Filled 2024-01-13: qty 180, 90d supply, fill #2

## 2023-04-10 MED ORDER — FREESTYLE LANCETS MISC
Freq: Two times a day (BID) | 6 refills | Status: AC
  Filled 2023-04-10: qty 100, 50d supply, fill #0

## 2023-05-06 DIAGNOSIS — R1110 Vomiting, unspecified: Secondary | ICD-10-CM | POA: Diagnosis not present

## 2023-05-06 DIAGNOSIS — E559 Vitamin D deficiency, unspecified: Secondary | ICD-10-CM | POA: Diagnosis not present

## 2023-05-06 DIAGNOSIS — E1165 Type 2 diabetes mellitus with hyperglycemia: Secondary | ICD-10-CM | POA: Diagnosis not present

## 2023-05-06 DIAGNOSIS — E7800 Pure hypercholesterolemia, unspecified: Secondary | ICD-10-CM | POA: Diagnosis not present

## 2023-07-04 ENCOUNTER — Other Ambulatory Visit (HOSPITAL_COMMUNITY): Payer: Self-pay

## 2023-07-04 ENCOUNTER — Other Ambulatory Visit: Payer: Self-pay

## 2023-07-08 DIAGNOSIS — E1165 Type 2 diabetes mellitus with hyperglycemia: Secondary | ICD-10-CM | POA: Diagnosis not present

## 2023-07-08 DIAGNOSIS — E559 Vitamin D deficiency, unspecified: Secondary | ICD-10-CM | POA: Diagnosis not present

## 2023-07-15 ENCOUNTER — Other Ambulatory Visit (HOSPITAL_COMMUNITY): Payer: Self-pay

## 2023-07-15 DIAGNOSIS — E559 Vitamin D deficiency, unspecified: Secondary | ICD-10-CM | POA: Diagnosis not present

## 2023-07-15 DIAGNOSIS — E7800 Pure hypercholesterolemia, unspecified: Secondary | ICD-10-CM | POA: Diagnosis not present

## 2023-07-15 DIAGNOSIS — E1165 Type 2 diabetes mellitus with hyperglycemia: Secondary | ICD-10-CM | POA: Diagnosis not present

## 2023-07-15 MED ORDER — MOUNJARO 2.5 MG/0.5ML ~~LOC~~ SOAJ
2.5000 mg | SUBCUTANEOUS | 3 refills | Status: DC
  Filled 2023-07-15: qty 2, 28d supply, fill #0
  Filled 2023-08-07 (×2): qty 2, 28d supply, fill #1
  Filled 2023-11-09: qty 2, 28d supply, fill #2
  Filled 2023-12-29: qty 2, 28d supply, fill #3

## 2023-08-07 ENCOUNTER — Other Ambulatory Visit (HOSPITAL_COMMUNITY): Payer: Self-pay

## 2023-08-07 ENCOUNTER — Encounter (HOSPITAL_COMMUNITY): Payer: Self-pay

## 2023-08-07 DIAGNOSIS — N939 Abnormal uterine and vaginal bleeding, unspecified: Secondary | ICD-10-CM | POA: Diagnosis not present

## 2023-08-07 DIAGNOSIS — S3141XA Laceration without foreign body of vagina and vulva, initial encounter: Secondary | ICD-10-CM | POA: Diagnosis not present

## 2023-08-08 ENCOUNTER — Other Ambulatory Visit (HOSPITAL_COMMUNITY): Payer: Self-pay

## 2023-09-18 ENCOUNTER — Other Ambulatory Visit (HOSPITAL_COMMUNITY): Payer: Self-pay

## 2023-09-18 MED ORDER — HYDROCORTISONE (PERIANAL) 2.5 % EX CREA
1.0000 | TOPICAL_CREAM | Freq: Four times a day (QID) | CUTANEOUS | 3 refills | Status: DC
  Filled 2023-09-18: qty 30, 5d supply, fill #0
  Filled 2023-11-09: qty 30, 5d supply, fill #1

## 2023-09-18 MED ORDER — HYDROCORTISONE ACETATE 25 MG RE SUPP
25.0000 mg | Freq: Two times a day (BID) | RECTAL | 0 refills | Status: DC
  Filled 2023-09-18: qty 28, 14d supply, fill #0

## 2023-09-18 MED ORDER — NITROGLYCERIN 0.4 % RE OINT
1.0000 | TOPICAL_OINTMENT | Freq: Three times a day (TID) | RECTAL | 0 refills | Status: DC
  Filled 2023-09-18: qty 30, 15d supply, fill #0

## 2023-09-19 ENCOUNTER — Other Ambulatory Visit (HOSPITAL_COMMUNITY): Payer: Self-pay

## 2023-09-22 ENCOUNTER — Encounter (INDEPENDENT_AMBULATORY_CARE_PROVIDER_SITE_OTHER): Payer: Self-pay | Admitting: *Deleted

## 2023-10-15 DIAGNOSIS — Z1231 Encounter for screening mammogram for malignant neoplasm of breast: Secondary | ICD-10-CM | POA: Diagnosis not present

## 2023-10-15 DIAGNOSIS — Z130 Encounter for screening for diseases of the blood and blood-forming organs and certain disorders involving the immune mechanism: Secondary | ICD-10-CM | POA: Diagnosis not present

## 2023-10-15 DIAGNOSIS — Z01419 Encounter for gynecological examination (general) (routine) without abnormal findings: Secondary | ICD-10-CM | POA: Diagnosis not present

## 2023-10-15 DIAGNOSIS — Z13228 Encounter for screening for other metabolic disorders: Secondary | ICD-10-CM | POA: Diagnosis not present

## 2023-10-15 DIAGNOSIS — Z13220 Encounter for screening for lipoid disorders: Secondary | ICD-10-CM | POA: Diagnosis not present

## 2023-10-15 DIAGNOSIS — Z1389 Encounter for screening for other disorder: Secondary | ICD-10-CM | POA: Diagnosis not present

## 2023-10-15 DIAGNOSIS — E559 Vitamin D deficiency, unspecified: Secondary | ICD-10-CM | POA: Diagnosis not present

## 2023-10-21 DIAGNOSIS — E1165 Type 2 diabetes mellitus with hyperglycemia: Secondary | ICD-10-CM | POA: Diagnosis not present

## 2023-10-21 DIAGNOSIS — E7800 Pure hypercholesterolemia, unspecified: Secondary | ICD-10-CM | POA: Diagnosis not present

## 2023-10-21 DIAGNOSIS — E559 Vitamin D deficiency, unspecified: Secondary | ICD-10-CM | POA: Diagnosis not present

## 2023-11-09 ENCOUNTER — Other Ambulatory Visit (HOSPITAL_COMMUNITY): Payer: Self-pay

## 2023-11-10 ENCOUNTER — Other Ambulatory Visit (HOSPITAL_COMMUNITY): Payer: Self-pay

## 2023-11-10 ENCOUNTER — Other Ambulatory Visit: Payer: Self-pay

## 2023-11-10 MED ORDER — ESCITALOPRAM OXALATE 20 MG PO TABS
20.0000 mg | ORAL_TABLET | Freq: Every day | ORAL | 2 refills | Status: AC
  Filled 2023-11-10: qty 90, 90d supply, fill #0
  Filled 2024-03-15: qty 90, 90d supply, fill #1

## 2023-11-10 MED ORDER — PANTOPRAZOLE SODIUM 40 MG PO TBEC
40.0000 mg | DELAYED_RELEASE_TABLET | Freq: Every day | ORAL | 2 refills | Status: AC
  Filled 2023-11-10: qty 90, 90d supply, fill #0
  Filled 2024-03-15: qty 90, 90d supply, fill #1

## 2023-11-20 ENCOUNTER — Other Ambulatory Visit: Payer: Self-pay

## 2023-11-20 ENCOUNTER — Emergency Department (HOSPITAL_COMMUNITY)

## 2023-11-20 ENCOUNTER — Emergency Department (HOSPITAL_COMMUNITY): Admission: EM | Admit: 2023-11-20 | Discharge: 2023-11-20 | Disposition: A | Discharge status: Home / Self Care

## 2023-11-20 ENCOUNTER — Encounter (HOSPITAL_COMMUNITY): Payer: Self-pay | Admitting: Emergency Medicine

## 2023-11-20 DIAGNOSIS — N132 Hydronephrosis with renal and ureteral calculous obstruction: Secondary | ICD-10-CM | POA: Insufficient documentation

## 2023-11-20 DIAGNOSIS — N200 Calculus of kidney: Secondary | ICD-10-CM | POA: Diagnosis not present

## 2023-11-20 DIAGNOSIS — K5730 Diverticulosis of large intestine without perforation or abscess without bleeding: Secondary | ICD-10-CM | POA: Diagnosis not present

## 2023-11-20 DIAGNOSIS — K8050 Calculus of bile duct without cholangitis or cholecystitis without obstruction: Secondary | ICD-10-CM | POA: Diagnosis not present

## 2023-11-20 DIAGNOSIS — R109 Unspecified abdominal pain: Secondary | ICD-10-CM | POA: Diagnosis present

## 2023-11-20 DIAGNOSIS — N281 Cyst of kidney, acquired: Secondary | ICD-10-CM | POA: Diagnosis not present

## 2023-11-20 HISTORY — DX: Calculus of kidney: N20.0

## 2023-11-20 LAB — URINALYSIS, ROUTINE W REFLEX MICROSCOPIC
Bilirubin Urine: NEGATIVE
Glucose, UA: NEGATIVE mg/dL
Ketones, ur: NEGATIVE mg/dL
Leukocytes,Ua: NEGATIVE
Nitrite: POSITIVE — AB
Protein, ur: 30 mg/dL — AB
RBC / HPF: >50 RBC/hpf (ref 0–5)
Specific Gravity, Urine: 1.025 (ref 1.005–1.030)
pH: 5 (ref 5.0–8.0)

## 2023-11-20 LAB — CBC WITH DIFFERENTIAL/PLATELET
Abs Immature Granulocytes: 0.04 K/uL (ref 0.00–0.07)
Basophils Absolute: 0 K/uL (ref 0.0–0.1)
Basophils Relative: 0 %
Eosinophils Absolute: 0.1 K/uL (ref 0.0–0.5)
Eosinophils Relative: 1 %
HCT: 39.3 % (ref 36.0–46.0)
Hemoglobin: 13.2 g/dL (ref 12.0–15.0)
Immature Granulocytes: 0 %
Lymphocytes Relative: 15 %
Lymphs Abs: 1.5 K/uL (ref 0.7–4.0)
MCH: 28 pg (ref 26.0–34.0)
MCHC: 33.6 g/dL (ref 30.0–36.0)
MCV: 83.3 fL (ref 80.0–100.0)
Monocytes Absolute: 0.7 K/uL (ref 0.1–1.0)
Monocytes Relative: 7 %
Neutro Abs: 7.4 K/uL (ref 1.7–7.7)
Neutrophils Relative %: 77 %
Platelets: 334 K/uL (ref 150–400)
RBC: 4.72 MIL/uL (ref 3.87–5.11)
RDW: 13.4 % (ref 11.5–15.5)
WBC: 9.8 K/uL (ref 4.0–10.5)
nRBC: 0 % (ref 0.0–0.2)

## 2023-11-20 LAB — BASIC METABOLIC PANEL WITH GFR
Anion gap: 15 (ref 5–15)
BUN: 11 mg/dL (ref 6–20)
CO2: 19 mmol/L — ABNORMAL LOW (ref 22–32)
Calcium: 8.6 mg/dL — ABNORMAL LOW (ref 8.9–10.3)
Chloride: 104 mmol/L (ref 98–111)
Creatinine, Ser: 0.7 mg/dL (ref 0.44–1.00)
GFR, Estimated: >60 mL/min (ref >60)
Glucose, Bld: 159 mg/dL — ABNORMAL HIGH (ref 70–99)
Potassium: 4 mmol/L (ref 3.5–5.1)
Sodium: 138 mmol/L (ref 135–145)

## 2023-11-20 LAB — PREGNANCY, URINE: Preg Test, Ur: NEGATIVE

## 2023-11-20 MED ORDER — TAMSULOSIN HCL 0.4 MG PO CAPS: 0.4000 mg | ORAL_CAPSULE | Freq: Every day | ORAL | 0 refills | Status: AC

## 2023-11-20 MED ORDER — ONDANSETRON HCL 4 MG/2ML IJ SOLN
4.0000 mg | Freq: Once | INTRAMUSCULAR | Status: AC
  Administered 2023-11-20: 4 mg via INTRAVENOUS
  Filled 2023-11-20: qty 2

## 2023-11-20 MED ORDER — HYDROMORPHONE HCL 1 MG/ML IJ SOLN
0.5000 mg | Freq: Once | INTRAMUSCULAR | Status: AC
  Administered 2023-11-20: 0.5 mg via INTRAVENOUS
  Filled 2023-11-20: qty 0.5

## 2023-11-20 MED ORDER — OXYCODONE HCL 5 MG PO TABS: 5.0000 mg | ORAL_TABLET | Freq: Four times a day (QID) | ORAL | 0 refills | Status: AC | PRN

## 2023-11-20 MED ORDER — ONDANSETRON HCL 4 MG PO TABS: 4.0000 mg | ORAL_TABLET | Freq: Three times a day (TID) | ORAL | 0 refills | Status: AC | PRN

## 2023-11-20 MED ORDER — SODIUM CHLORIDE 0.9 % IV BOLUS
1000.0000 mL | Freq: Once | INTRAVENOUS | Status: AC
  Administered 2023-11-20: 1000 mL via INTRAVENOUS

## 2023-11-20 MED ORDER — KETOROLAC TROMETHAMINE 15 MG/ML IJ SOLN
15.0000 mg | Freq: Once | INTRAMUSCULAR | Status: AC
  Administered 2023-11-20: 15 mg via INTRAVENOUS
  Filled 2023-11-20: qty 1

## 2023-11-20 NOTE — ED Notes (Signed)
 Pt taken to ct

## 2023-11-20 NOTE — ED Notes (Signed)
 Pt restless and c/o nausea and pain back up to 7, edp aware

## 2023-11-20 NOTE — Discharge Instructions (Addendum)
 Take your Flomax  as prescribed.  You can alternate Tylenol  and Motrin every 3 hours as needed for pain.  You can use your oxycodone  as needed for pain and Zofran  as needed for nausea and vomiting.

## 2023-11-20 NOTE — ED Triage Notes (Signed)
 Sudden right flank pain from back into groin since 2am today. Hx of kidney stones. Grimacing noted. A/o.

## 2023-11-20 NOTE — ED Provider Notes (Signed)
  EMERGENCY DEPARTMENT AT [facility] Provider Note   CSN: [id number] Arrival date & time: 11/20/23  9266     Patient presents with: Flank Pain   [name] is a 48 y.o. female.   48 year old female presents for evaluation of right flank pain.  States it started out today and it feels like a kidney stone.  She states she has a history of kidney stones and this feels very similar.  Has not required a stent in the past.  Denies any nausea or pain with urination.  Denies any other symptoms or concerns.   Flank Pain Pertinent negatives include no chest pain, no abdominal pain and no shortness of breath.       Prior to Admission medications   Medication Sig Start Date End Date Taking? Authorizing Provider  ondansetron  (ZOFRAN ) 4 MG tablet Take 1 tablet (4 mg total) by mouth every 8 (eight) hours as needed for up to 4 days. 11/20/23 11/24/23 Yes Khristi Schiller L, DO  oxyCODONE  (ROXICODONE ) 5 MG immediate release tablet Take 1 tablet (5 mg total) by mouth every 6 (six) hours as needed for up to 3 days for severe pain (pain score 7-10). 11/20/23 11/23/23 Yes Addalyn Speedy L, DO  tamsulosin  (FLOMAX ) 0.4 MG CAPS capsule Take 1 capsule (0.4 mg total) by mouth daily for 14 days. 11/20/23 12/04/23 Yes Annamary Buschman L, DO  azelastine  (ASTELIN ) 0.1 % nasal spray Place 1 spray into both nostrils 2 (two) times daily. Use in each nostril as directed 04/09/18   Graham, Elysa, NP  benzonatate  (TESSALON ) 100 MG capsule Take 1-2 capsules (100-200 mg total) by mouth 2 (two) times daily as needed for cough. Patient not taking: Reported on 02/14/2023 04/07/22   Theotis Haze ORN, NP  Blood Glucose Monitoring Suppl (FREESTYLE LITE) w/Device KIT Use as directed 09/25/20     cholecalciferol (VITAMIN D3) 25 MCG (1000 UT) tablet Take 5,000 Units by mouth daily.    [provider]  COVID-19 At Home Antigen Test (QUICKVUE AT-HOME COVID-19 TEST) KIT Use as directed 12/19/20   Deane Garnette SAUNDERS,  RPH  cyclobenzaprine  (FLEXERIL ) 10 MG tablet Take 1 tablet (10 mg total) by mouth 2 (two) times daily as needed for muscle spasms. 11/03/20   Wurst, [place], PA-C  escitalopram  (LEXAPRO ) 20 MG tablet Take 1 tablet (20 mg total) by mouth daily. 11/10/23   Estelle Service, MD  glucose blood test strip Use as directed twice daily 09/25/20     glucose blood test strip Use to test blood sugar levels twice daily. 04/10/23     hydrocortisone  (ANUSOL -HC) 2.5 % rectal cream Apply a thin layer to the affected area(s) topically 2 (two) to 4 (four) times daily. 09/18/23     hydrocortisone  (ANUSOL -HC) 25 MG suppository Place 1 suppository (25 mg total) rectally 2 (two) times daily. 09/18/23     Lancets (FREESTYLE) lancets Use to test blood sugar levels twice daily. 04/10/23     levonorgestrel  (MIRENA ) 20 MCG/24HR IUD 1 each by Intrauterine route once.    [provider]  metFORMIN  (GLUCOPHAGE -XR) 500 MG 24 hr tablet Take 1 tablet (500 mg total) by mouth 2 (two) times daily. 04/10/23     metoprolol  succinate (TOPROL -XL) 25 MG 24 hr tablet Take 1 tablet (25 mg total) by mouth daily. 03/17/23   Pietro Redell RAMAN, MD  Nitroglycerin  0.4 % OINT Apply a small amount of ointment to distal rectum as needed twice to three times daily 09/18/23  pantoprazole  (PROTONIX ) 40 MG tablet Take 1 tablet (40 mg total) by mouth daily. 11/10/23     tirzepatide  (MOUNJARO ) 2.5 MG/0.5ML Pen Inject 2.5 mg into the skin once a week. 07/15/23     esomeprazole (NEXIUM) 40 MG capsule Take 40 mg by mouth daily before breakfast.  10/14/18  [provider]    Allergies: Erythromycin base and Other    Review of Systems  Constitutional:  Negative for chills and fever.  HENT:  Negative for ear pain and sore throat.   Eyes:  Negative for pain and visual disturbance.  Respiratory:  Negative for cough and shortness of breath.   Cardiovascular:  Negative for chest pain and palpitations.  Gastrointestinal:  Negative for abdominal pain  and vomiting.  Genitourinary:  Positive for flank pain. Negative for dysuria and hematuria.  Musculoskeletal:  Negative for arthralgias and back pain.  Skin:  Negative for color change and rash.  Neurological:  Negative for seizures and syncope.  All other systems reviewed and are negative.   Updated Vital Signs BP 134/78   Pulse 77   Temp 97.9 F (36.6 C) (Oral)   Resp 15   SpO2 94%   Physical Exam Vitals and nursing note reviewed.  Constitutional:      General: She is not in acute distress.    Appearance: She is well-developed.     Comments: Uncomfortable appearing  HENT:     Head: Normocephalic and atraumatic.  Eyes:     Conjunctiva/sclera: Conjunctivae normal.  Cardiovascular:     Rate and Rhythm: Normal rate and regular rhythm.     Heart sounds: No murmur heard. Pulmonary:     Effort: Pulmonary effort is normal. No respiratory distress.     Breath sounds: Normal breath sounds.  Abdominal:     General: There is no distension.     Palpations: Abdomen is soft. There is no mass.     Tenderness: There is no abdominal tenderness. There is right CVA tenderness.     Hernia: No hernia is present.  Musculoskeletal:        General: No swelling.     Cervical back: Neck supple.  Skin:    General: Skin is warm and dry.     Capillary Refill: Capillary refill takes less than 2 seconds.  Neurological:     Mental Status: She is alert.  Psychiatric:        Mood and Affect: Mood normal.     (all labs ordered are listed, but only abnormal results are displayed) Labs Reviewed  URINALYSIS, ROUTINE W REFLEX MICROSCOPIC - Abnormal; Notable for the following components:      Result Value   Color, Urine AMBER (*)    APPearance HAZY (*)    Hgb urine dipstick MODERATE (*)    Protein, ur 30 (*)    Nitrite POSITIVE (*)    Bacteria, UA RARE (*)    All other components within normal limits  BASIC METABOLIC PANEL WITH GFR - Abnormal; Notable for the following components:   CO2 19 (*)     Glucose, Bld 159 (*)    Calcium 8.6 (*)    All other components within normal limits  PREGNANCY, URINE  CBC WITH DIFFERENTIAL/PLATELET    EKG: None  Radiology: CT Renal Stone Study Result Date: 11/20/2023 EXAM: CT ABDOMEN AND PELVIS WITHOUT CONTRAST 11/20/2023 08:15:51 AM TECHNIQUE: CT of the abdomen and pelvis was performed without the administration of intravenous contrast. Multiplanar reformatted images are provided for review. Automated  exposure control, iterative reconstruction, and/or weight based adjustment of the mA/kV was utilized to reduce the radiation dose to as low as reasonably achievable. COMPARISON: CT of the abdomen and pelvis dated 12/22/2007. CLINICAL HISTORY: Abdominal/flank pain, stone suspected. Right flank pain from back into groin since 2am today. History of kidney stones. FINDINGS: LOWER CHEST: No acute abnormality. LIVER: The liver is unremarkable. GALLBLADDER AND BILE DUCTS: There is a small stone layering dependently within the gallbladder. There is no evidence of cholecystitis. SPLEEN: No acute abnormality. PANCREAS: No acute abnormality. ADRENAL GLANDS: No acute abnormality. KIDNEYS, URETERS AND BLADDER: There is a 3 mm calculus present within the right distal ureter, image 84 of series 2, resulting in mild-to-moderate right hydroureteronephrosis and mild renal swelling. There is a 2 to 3 mm nonobstructive calculus present anteriorly within the left kidney, image 37. There is also an ovoid exophytic cyst arising anteriorly from the upper pole of the left kidney, measuring approximately 3.0 x 2.8 cm, which has decreased in size since the previous study and does not require any follow up. There is no evidence of obstructive uropathy on the left. GI AND BOWEL: There are a few sigmoid diverticula present. The small bowel is unremarkable. PERITONEUM AND RETROPERITONEUM: No ascites. No free air. VASCULATURE: Aorta is normal in caliber. LYMPH NODES: No lymphadenopathy.  REPRODUCTIVE ORGANS: An intrauterine device is incidentally noted. BONES AND SOFT TISSUES: No acute osseous abnormality. No focal soft tissue abnormality. IMPRESSION: 1. 3 mm calculus in the right distal ureter resulting in mild-to-moderate right hydroureteronephrosis and mild renal swelling. 2. 2 to 3 mm nonobstructive calculus in the left kidney. No evidence of obstructive uropathy on the left. 3. Small stone layering dependently within the gallbladder. No evidence of cholecystitis. 4. Ovoid exophytic cyst in the upper pole of the left kidney, decreased in size since the previous study, no follow-up required. 5. Few sigmoid diverticula without evidence of diverticulitis. Electronically signed by: evalene coho 11/20/2023 08:59 AM EDT RP Workstation: [id number]     Procedures   Medications Ordered in the ED  ketorolac  (TORADOL ) 15 MG/ML injection 15 mg (15 mg Intravenous Given 11/20/23 0805)  sodium chloride  0.9 % bolus 1,000 mL (0 mLs Intravenous Stopped 11/20/23 0916)  ondansetron  (ZOFRAN ) injection 4 mg (4 mg Intravenous Given 11/20/23 0835)  HYDROmorphone  (DILAUDID ) injection 0.5 mg (0.5 mg Intravenous Given 11/20/23 0835)  HYDROmorphone  (DILAUDID ) injection 0.5 mg (0.5 mg Intravenous Given 11/20/23 0922)  ondansetron  (ZOFRAN ) injection 4 mg (4 mg Intravenous Given 11/20/23 9078)                                    Medical Decision Making Patient here for flank pain.  On CT scan she does have a 3 mm kidney stone.  Labs are fairly unremarkable.  Pain was controlled with Toradol  and Dilaudid .  I gave her prescription for Zofran  oxycodone  as well as Flomax .  Was given some IV fluids in the ER as well.  Also advised to use Tylenol  and Motrin as needed and follow-up with urology as needed.  Advised otherwise to return to the ER for any worsening symptoms.  Patient feels comfortable to plan be discharged home.  Problems Addressed: Kidney stone: acute illness or injury  Amount and/or Complexity of Data  Reviewed External Data Reviewed: notes.    Details: Previous ED visits reviewed and patient has a history of kidney stones in the past which she has been  seen for Labs: ordered. Decision-making details documented in ED Course.    Details: Ordered and reviewed by me and CBC and BMP are unremarkable, kidney function is within normal limits and there is no leukocytosis Radiology: ordered and independent interpretation performed. Decision-making details documented in ED Course.    Details: CT abdomen pelvis ordered and reviewed by me Shows right 3 mm ureterolithiasis and small left nephrolithiasis as well as 1 stone in the gallbladder without evidence of cholecystitis  Risk OTC drugs. Prescription drug management. Parenteral controlled substances. Drug therapy requiring intensive monitoring for toxicity.     Final diagnoses:  Kidney stone    ED Discharge Orders          Ordered    tamsulosin  (FLOMAX ) 0.4 MG CAPS capsule  Daily        11/20/23 0914    ondansetron  (ZOFRAN ) 4 MG tablet  Every 8 hours PRN        11/20/23 0914    oxyCODONE  (ROXICODONE ) 5 MG immediate release tablet  Every 6 hours PRN        11/20/23 0914               Jeffrie Stander L, DO 11/20/23 9042

## 2023-12-18 ENCOUNTER — Encounter: Payer: Self-pay | Admitting: Gastroenterology

## 2023-12-29 ENCOUNTER — Other Ambulatory Visit (HOSPITAL_COMMUNITY): Payer: Self-pay

## 2024-01-12 ENCOUNTER — Other Ambulatory Visit: Payer: Self-pay | Admitting: *Deleted

## 2024-01-12 ENCOUNTER — Encounter: Payer: Self-pay | Admitting: Gastroenterology

## 2024-01-12 ENCOUNTER — Encounter: Payer: Self-pay | Admitting: *Deleted

## 2024-01-12 ENCOUNTER — Ambulatory Visit: Admitting: Gastroenterology

## 2024-01-12 ENCOUNTER — Other Ambulatory Visit (HOSPITAL_COMMUNITY): Payer: Self-pay

## 2024-01-12 VITALS — BP 129/79 | HR 78 | Temp 98.7°F | Ht 67.0 in | Wt 315.8 lb

## 2024-01-12 DIAGNOSIS — K602 Anal fissure, unspecified: Secondary | ICD-10-CM

## 2024-01-12 DIAGNOSIS — K219 Gastro-esophageal reflux disease without esophagitis: Secondary | ICD-10-CM | POA: Insufficient documentation

## 2024-01-12 DIAGNOSIS — Z800 Family history of malignant neoplasm of digestive organs: Secondary | ICD-10-CM | POA: Diagnosis not present

## 2024-01-12 DIAGNOSIS — K6289 Other specified diseases of anus and rectum: Secondary | ICD-10-CM | POA: Insufficient documentation

## 2024-01-12 DIAGNOSIS — K642 Third degree hemorrhoids: Secondary | ICD-10-CM

## 2024-01-12 MED ORDER — HYDROCORTISONE (PERIANAL) 2.5 % EX CREA
1.0000 | TOPICAL_CREAM | Freq: Four times a day (QID) | CUTANEOUS | 1 refills | Status: AC
  Filled 2024-01-12: qty 30, 5d supply, fill #0

## 2024-01-12 MED ORDER — NITROGLYCERIN 0.4 % RE OINT
1.0000 | TOPICAL_OINTMENT | Freq: Two times a day (BID) | RECTAL | 0 refills | Status: DC
  Filled 2024-01-12: qty 30, 15d supply, fill #0

## 2024-01-12 MED ORDER — NA SULFATE-K SULFATE-MG SULF 17.5-3.13-1.6 GM/177ML PO SOLN
ORAL | 0 refills | Status: AC
  Filled 2024-01-12: qty 354, 1d supply, fill #0

## 2024-01-12 NOTE — Progress Notes (Signed)
 GI Office Note    Referring Provider: Estelle Service, MD Primary Care Physician:  Freddrick Johns  Primary Gastroenterologist: Ozell Hollingshead, MD   Chief Complaint   Chief Complaint  Patient presents with   Colonoscopy    Also wants to discuss hemorrhoids.      History of Present Illness   [name] is a 48 y.o. female presenting today to set up screening colonoscopy and discuss hemorrhoids/anal fissure. She also has chronic GERD.   Discussed the use of AI scribe software for clinical note transcription with the patient, who gave verbal consent to proceed.    Patient desires screening colonoscopy. She notes history of rectal pain, advised by her PCP that she has a fissure. She has been using NTG ointment and hydrocortisone  cream prn. Does seem to help and as soon as she feels better, she stops the medication. She notes her stools are soft but can ben less productive and sometimes harder to pass. She has a small tail' around her rectum that she noted when trying to insert a suppository. She notes that this area is very tender to touch. She also notes hemorrhoids that prolapse, grade 3. Denies brbpr. No abdominal pain. She has chronic gerd, has been on ppi for many years. She has some solid food dysphagia. No n/v.     Prior Data   Lab Results  Component Value Date   NA 138 11/20/2023   CL 104 11/20/2023   K 4.0 11/20/2023   CO2 19 (L) 11/20/2023   BUN 11 11/20/2023   CREATININE 0.70 11/20/2023   GFRNONAA >60 11/20/2023   CALCIUM 8.6 (L) 11/20/2023   ALBUMIN 4.2 10/20/2017   GLUCOSE 159 (H) 11/20/2023   Lab Results  Component Value Date   WBC 9.8 11/20/2023   HGB 13.2 11/20/2023   HCT 39.3 11/20/2023   MCV 83.3 11/20/2023   PLT 334 11/20/2023       Medications   Current Outpatient Medications  Medication Sig Dispense Refill   azelastine  (ASTELIN ) 0.1 % nasal spray Place 1 spray into both nostrils 2 (two) times daily. Use in each nostril as directed 30 mL 0    Blood Glucose Monitoring Suppl (FREESTYLE LITE) w/Device KIT Use as directed 1 kit 1   escitalopram  (LEXAPRO ) 20 MG tablet Take 1 tablet (20 mg total) by mouth daily. 90 tablet 2   glucose blood test strip Use to test blood sugar levels twice daily. 100 each 4   hydrocortisone  (ANUSOL -HC) 2.5 % rectal cream Apply a thin layer to the affected area(s) topically 2 (two) to 4 (four) times daily. 30 g 3   hydrocortisone  (ANUSOL -HC) 25 MG suppository Place 1 suppository (25 mg total) rectally 2 (two) times daily. 28 suppository 0   Lancets (FREESTYLE) lancets Use to test blood sugar levels twice daily. 100 each 6   levonorgestrel  (MIRENA ) 20 MCG/24HR IUD 1 each by Intrauterine route once.     metFORMIN  (GLUCOPHAGE -XR) 500 MG 24 hr tablet Take 1 tablet (500 mg total) by mouth 2 (two) times daily. 180 tablet 4   metoprolol  succinate (TOPROL -XL) 25 MG 24 hr tablet Take 1 tablet (25 mg total) by mouth daily. 90 tablet 3   Nitroglycerin  0.4 % OINT Apply a small amount of ointment to distal rectum as needed twice to three times daily 30 g 0   pantoprazole  (PROTONIX ) 40 MG tablet Take 1 tablet (40 mg total) by mouth daily. 90 tablet 2   tirzepatide  (MOUNJARO ) 2.5 MG/0.5ML  Pen Inject 2.5 mg into the skin once a week. 2 mL 3   Cholecalciferol 125 MCG (5000 UT) capsule Take 5,000 Units by mouth daily.     No current facility-administered medications for this visit.    Allergies   Allergies as of 01/12/2024 - Review Complete 01/12/2024  Allergen Reaction Noted   Erythromycin base  04/09/2018   Other  11/20/2023    Past Medical History   Past Medical History:  Diagnosis Date   Back pain    Diabetes mellitus (HCC)    GERD (gastroesophageal reflux disease)    Kidney stones    Palpitations    PONV (postoperative nausea and vomiting)    Spinal headache     Past Surgical History   Past Surgical History:  Procedure Laterality Date   CESAREAN SECTION     x2   Left knee arthroscopic surgery      WISDOM TOOTH EXTRACTION      Past Family History   Family History  Problem Relation Age of Onset   Hypertension Mother    Stroke Mother    Anesthesia problems Mother    Other Father        bowel perforation, constipation after surgery   Anesthesia problems Sister    Breast cancer Maternal Grandmother    Uterine cancer Maternal Grandmother    Colon cancer Maternal Grandmother    Breast cancer Paternal Grandmother    Breast cancer Paternal Aunt    Breast cancer Paternal Aunt     Past Social History   Social History   Socioeconomic History   Marital status: Divorced    Spouse name: Not on file   Number of children: 2   Years of education: Not on file   Highest education level: Not on file  Occupational History    Employer: [employer]  Tobacco Use   Smoking status: Former    Current packs/day: 0.00    Types: Cigarettes    Quit date: 08/03/2002    Years since quitting: 21.4   Smokeless tobacco: Never  Vaping Use   Vaping status: Never Used  Substance and Sexual Activity   Alcohol use: No   Drug use: No   Sexual activity: Not on file  Other Topics Concern   Not on file  Social History Narrative   Not on file   Social Drivers of Health   Financial Resource Strain: Not on file  Food Insecurity: Not on file  Transportation Needs: Not on file  Physical Activity: Not on file  Stress: Not on file  Social Connections: Not on file  Intimate Partner Violence: Not on file    Review of Systems   General: Negative for anorexia, weight loss, fever, chills, fatigue, weakness. Eyes: Negative for vision changes.  ENT: Negative for hoarseness, difficulty swallowing , nasal congestion. CV: Negative for chest pain, angina, palpitations, dyspnea on exertion, peripheral edema.  Respiratory: Negative for dyspnea at rest, dyspnea on exertion, cough, sputum, wheezing.  GI: See history of present illness. GU:  Negative for dysuria, hematuria, urinary incontinence, urinary  frequency, nocturnal urination.  MS: Negative for joint pain, low back pain.  Derm: Negative for rash or itching.  Neuro: Negative for weakness, abnormal sensation, seizure, frequent headaches, memory loss,  confusion.  Psych: Negative for anxiety, depression, suicidal ideation, hallucinations.  Endo: Negative for unusual weight change.  Heme: Negative for bruising or bleeding. Allergy: Negative for rash or hives.  Physical Exam   BP 129/79 (BP Location: Right Arm, Patient  Position: Sitting, Cuff Size: Large)   Pulse 78   Temp 98.7 F (37.1 C) (Oral)   Ht 5' 7 (1.702 m)   Wt (!) 315 lb 12.8 oz (143.2 kg)   LMP  (LMP Unknown)   SpO2 96%   BMI 49.46 kg/m    General: Well-nourished, well-developed in no acute distress.  Head: Normocephalic, atraumatic.   Eyes: Conjunctiva pink, no icterus. Mouth: Oropharyngeal mucosa moist and pink  Neck: Supple without thyromegaly, masses, or lymphadenopathy.  Lungs: Clear to auscultation bilaterally.  Heart: Regular rate and rhythm, no murmurs rubs or gallops.  Abdomen: Bowel sounds are normal, nontender, nondistended, no hepatosplenomegaly or masses,  no abdominal bruits or hernia, no rebound or guarding.   Rectal: external exam shows prolapsed hemorrhoid, grade 3 at 12 oclock. Sentinel skin tag, at 6 oclock, tender with palpation of site but tolerated internal exam ok. Noted fissure at this site. No rectal masses noted but exam limited by body habitus.no gross blood seen.   Extremities: No lower extremity edema. No clubbing or deformities.  Neuro: Alert and oriented x 4 , grossly normal neurologically.  Skin: Warm and dry, no rash or jaundice.   Psych: Alert and cooperative, normal mood and affect.  Labs   Lab Results  Component Value Date   NA 138 11/20/2023   CL 104 11/20/2023   K 4.0 11/20/2023   CO2 19 (L) 11/20/2023   BUN 11 11/20/2023   CREATININE 0.70 11/20/2023   GFRNONAA >60 11/20/2023   CALCIUM 8.6 (L) 11/20/2023    ALBUMIN 4.2 10/20/2017   GLUCOSE 159 (H) 11/20/2023   Lab Results  Component Value Date   WBC 9.8 11/20/2023   HGB 13.2 11/20/2023   HCT 39.3 11/20/2023   MCV 83.3 11/20/2023   PLT 334 11/20/2023    Imaging Studies   No results found.  Assessment/Plan:   Screening colonoscopy/FH colon caner: due for screening colonoscopy. She has been dealing with anal fissure and grade 3 internal hemorrhoids. She is having some relief with NTG ointment but stops as soon as she feels better and then relapses.  -NTG  0.4% ointment, apply to anorectal area BID for 3 weeks -continue hydrocortisone  to anorectal area QID prn -colonoscopy with Dr. Shaaron. ASA 3. Room 1,2 ok.  I have discussed the risks, alternatives, benefits with regards to but not limited to the risk of reaction to medication, bleeding, infection, perforation and the patient is agreeable to proceed. Written consent to be obtained. -short volume prep -urine pregnancy test 3-5 days before procedures  GERD:  Doing well but has required PPI for many years. She inquires about Barrett's screening. She has several risk factors including GERD chronicity, obesity.  -EGD. ASA #, Room 1,2.  I have discussed the risks, alternatives, benefits with regards to but not limited to the risk of reaction to medication, bleeding, infection, perforation and the patient is agreeable to proceed. Written consent to be obtained.      Sonny RAMAN. Ezzard, MHS, PA-C [facility]

## 2024-01-12 NOTE — H&P (View-Only) (Signed)
 GI Office Note    Referring Provider: Estelle Service, MD Primary Care Physician:  Freddrick Johns  Primary Gastroenterologist: Ozell Hollingshead, MD   Chief Complaint   Chief Complaint  Patient presents with   Colonoscopy    Also wants to discuss hemorrhoids.      History of Present Illness   [name] is a 48 y.o. female presenting today to set up screening colonoscopy and discuss hemorrhoids/anal fissure. She also has chronic GERD.   Discussed the use of AI scribe software for clinical note transcription with the patient, who gave verbal consent to proceed.    Patient desires screening colonoscopy. She notes history of rectal pain, advised by her PCP that she has a fissure. She has been using NTG ointment and hydrocortisone  cream prn. Does seem to help and as soon as she feels better, she stops the medication. She notes her stools are soft but can ben less productive and sometimes harder to pass. She has a small tail' around her rectum that she noted when trying to insert a suppository. She notes that this area is very tender to touch. She also notes hemorrhoids that prolapse, grade 3. Denies brbpr. No abdominal pain. She has chronic gerd, has been on ppi for many years. She has some solid food dysphagia. No n/v.     Prior Data   Lab Results  Component Value Date   NA 138 11/20/2023   CL 104 11/20/2023   K 4.0 11/20/2023   CO2 19 (L) 11/20/2023   BUN 11 11/20/2023   CREATININE 0.70 11/20/2023   GFRNONAA >60 11/20/2023   CALCIUM 8.6 (L) 11/20/2023   ALBUMIN 4.2 10/20/2017   GLUCOSE 159 (H) 11/20/2023   Lab Results  Component Value Date   WBC 9.8 11/20/2023   HGB 13.2 11/20/2023   HCT 39.3 11/20/2023   MCV 83.3 11/20/2023   PLT 334 11/20/2023       Medications   Current Outpatient Medications  Medication Sig Dispense Refill   azelastine  (ASTELIN ) 0.1 % nasal spray Place 1 spray into both nostrils 2 (two) times daily. Use in each nostril as directed 30 mL 0    Blood Glucose Monitoring Suppl (FREESTYLE LITE) w/Device KIT Use as directed 1 kit 1   escitalopram  (LEXAPRO ) 20 MG tablet Take 1 tablet (20 mg total) by mouth daily. 90 tablet 2   glucose blood test strip Use to test blood sugar levels twice daily. 100 each 4   hydrocortisone  (ANUSOL -HC) 2.5 % rectal cream Apply a thin layer to the affected area(s) topically 2 (two) to 4 (four) times daily. 30 g 3   hydrocortisone  (ANUSOL -HC) 25 MG suppository Place 1 suppository (25 mg total) rectally 2 (two) times daily. 28 suppository 0   Lancets (FREESTYLE) lancets Use to test blood sugar levels twice daily. 100 each 6   levonorgestrel  (MIRENA ) 20 MCG/24HR IUD 1 each by Intrauterine route once.     metFORMIN  (GLUCOPHAGE -XR) 500 MG 24 hr tablet Take 1 tablet (500 mg total) by mouth 2 (two) times daily. 180 tablet 4   metoprolol  succinate (TOPROL -XL) 25 MG 24 hr tablet Take 1 tablet (25 mg total) by mouth daily. 90 tablet 3   Nitroglycerin  0.4 % OINT Apply a small amount of ointment to distal rectum as needed twice to three times daily 30 g 0   pantoprazole  (PROTONIX ) 40 MG tablet Take 1 tablet (40 mg total) by mouth daily. 90 tablet 2   tirzepatide  (MOUNJARO ) 2.5 MG/0.5ML  Pen Inject 2.5 mg into the skin once a week. 2 mL 3   Cholecalciferol 125 MCG (5000 UT) capsule Take 5,000 Units by mouth daily.     No current facility-administered medications for this visit.    Allergies   Allergies as of 01/12/2024 - Review Complete 01/12/2024  Allergen Reaction Noted   Erythromycin base  04/09/2018   Other  11/20/2023    Past Medical History   Past Medical History:  Diagnosis Date   Back pain    Diabetes mellitus (HCC)    GERD (gastroesophageal reflux disease)    Kidney stones    Palpitations    PONV (postoperative nausea and vomiting)    Spinal headache     Past Surgical History   Past Surgical History:  Procedure Laterality Date   CESAREAN SECTION     x2   Left knee arthroscopic surgery      WISDOM TOOTH EXTRACTION      Past Family History   Family History  Problem Relation Age of Onset   Hypertension Mother    Stroke Mother    Anesthesia problems Mother    Other Father        bowel perforation, constipation after surgery   Anesthesia problems Sister    Breast cancer Maternal Grandmother    Uterine cancer Maternal Grandmother    Colon cancer Maternal Grandmother    Breast cancer Paternal Grandmother    Breast cancer Paternal Aunt    Breast cancer Paternal Aunt     Past Social History   Social History   Socioeconomic History   Marital status: Divorced    Spouse name: Not on file   Number of children: 2   Years of education: Not on file   Highest education level: Not on file  Occupational History    Employer: [employer]  Tobacco Use   Smoking status: Former    Current packs/day: 0.00    Types: Cigarettes    Quit date: 08/03/2002    Years since quitting: 21.4   Smokeless tobacco: Never  Vaping Use   Vaping status: Never Used  Substance and Sexual Activity   Alcohol use: No   Drug use: No   Sexual activity: Not on file  Other Topics Concern   Not on file  Social History Narrative   Not on file   Social Drivers of Health   Financial Resource Strain: Not on file  Food Insecurity: Not on file  Transportation Needs: Not on file  Physical Activity: Not on file  Stress: Not on file  Social Connections: Not on file  Intimate Partner Violence: Not on file    Review of Systems   General: Negative for anorexia, weight loss, fever, chills, fatigue, weakness. Eyes: Negative for vision changes.  ENT: Negative for hoarseness, difficulty swallowing , nasal congestion. CV: Negative for chest pain, angina, palpitations, dyspnea on exertion, peripheral edema.  Respiratory: Negative for dyspnea at rest, dyspnea on exertion, cough, sputum, wheezing.  GI: See history of present illness. GU:  Negative for dysuria, hematuria, urinary incontinence, urinary  frequency, nocturnal urination.  MS: Negative for joint pain, low back pain.  Derm: Negative for rash or itching.  Neuro: Negative for weakness, abnormal sensation, seizure, frequent headaches, memory loss,  confusion.  Psych: Negative for anxiety, depression, suicidal ideation, hallucinations.  Endo: Negative for unusual weight change.  Heme: Negative for bruising or bleeding. Allergy: Negative for rash or hives.  Physical Exam   BP 129/79 (BP Location: Right Arm, Patient  Position: Sitting, Cuff Size: Large)   Pulse 78   Temp 98.7 F (37.1 C) (Oral)   Ht 5' 7 (1.702 m)   Wt (!) 315 lb 12.8 oz (143.2 kg)   LMP  (LMP Unknown)   SpO2 96%   BMI 49.46 kg/m    General: Well-nourished, well-developed in no acute distress.  Head: Normocephalic, atraumatic.   Eyes: Conjunctiva pink, no icterus. Mouth: Oropharyngeal mucosa moist and pink  Neck: Supple without thyromegaly, masses, or lymphadenopathy.  Lungs: Clear to auscultation bilaterally.  Heart: Regular rate and rhythm, no murmurs rubs or gallops.  Abdomen: Bowel sounds are normal, nontender, nondistended, no hepatosplenomegaly or masses,  no abdominal bruits or hernia, no rebound or guarding.   Rectal: external exam shows prolapsed hemorrhoid, grade 3 at 12 oclock. Sentinel skin tag, at 6 oclock, tender with palpation of site but tolerated internal exam ok. Noted fissure at this site. No rectal masses noted but exam limited by body habitus.no gross blood seen.   Extremities: No lower extremity edema. No clubbing or deformities.  Neuro: Alert and oriented x 4 , grossly normal neurologically.  Skin: Warm and dry, no rash or jaundice.   Psych: Alert and cooperative, normal mood and affect.  Labs   Lab Results  Component Value Date   NA 138 11/20/2023   CL 104 11/20/2023   K 4.0 11/20/2023   CO2 19 (L) 11/20/2023   BUN 11 11/20/2023   CREATININE 0.70 11/20/2023   GFRNONAA >60 11/20/2023   CALCIUM 8.6 (L) 11/20/2023    ALBUMIN 4.2 10/20/2017   GLUCOSE 159 (H) 11/20/2023   Lab Results  Component Value Date   WBC 9.8 11/20/2023   HGB 13.2 11/20/2023   HCT 39.3 11/20/2023   MCV 83.3 11/20/2023   PLT 334 11/20/2023    Imaging Studies   No results found.  Assessment/Plan:   Screening colonoscopy/FH colon caner: due for screening colonoscopy. She has been dealing with anal fissure and grade 3 internal hemorrhoids. She is having some relief with NTG ointment but stops as soon as she feels better and then relapses.  -NTG  0.4% ointment, apply to anorectal area BID for 3 weeks -continue hydrocortisone  to anorectal area QID prn -colonoscopy with Dr. Shaaron. ASA 3. Room 1,2 ok.  I have discussed the risks, alternatives, benefits with regards to but not limited to the risk of reaction to medication, bleeding, infection, perforation and the patient is agreeable to proceed. Written consent to be obtained. -short volume prep -urine pregnancy test 3-5 days before procedures  GERD:  Doing well but has required PPI for many years. She inquires about Barrett's screening. She has several risk factors including GERD chronicity, obesity.  -EGD. ASA #, Room 1,2.  I have discussed the risks, alternatives, benefits with regards to but not limited to the risk of reaction to medication, bleeding, infection, perforation and the patient is agreeable to proceed. Written consent to be obtained.      Sonny RAMAN. Ezzard, MHS, PA-C [facility]

## 2024-01-12 NOTE — Patient Instructions (Addendum)
 Use NTG ointment intra-anally twice daily for 3 weeks to treat anal fissure.   Use hydrocortisone  cream anorectally three times daily as needed for rectal pain.   Use miralax one capful twice daily until soft stool, then continue 1-2 times daily to maintain soft stool. Let me know if you are not having more productive stools. We need to get you going better before your colonoscopy.  Continue pantoprazole  40mg  daily before breakfast.  Upper endoscopy and colonoscopy to be scheduled. See separate instructions.  You will need to complete a urine pregnancy test 3-5 days before your procedures. You can go to [place] [facility]. Orders are in the system.

## 2024-01-13 ENCOUNTER — Other Ambulatory Visit (HOSPITAL_COMMUNITY): Payer: Self-pay

## 2024-01-26 ENCOUNTER — Ambulatory Visit: Payer: Self-pay | Admitting: Gastroenterology

## 2024-01-26 ENCOUNTER — Other Ambulatory Visit (HOSPITAL_COMMUNITY)
Admission: RE | Admit: 2024-01-26 | Discharge: 2024-01-26 | Disposition: A | Discharge status: Home / Self Care | Source: Ambulatory Visit | Attending: Internal Medicine | Admitting: Internal Medicine

## 2024-01-26 DIAGNOSIS — K6289 Other specified diseases of anus and rectum: Secondary | ICD-10-CM | POA: Insufficient documentation

## 2024-01-26 DIAGNOSIS — Z800 Family history of malignant neoplasm of digestive organs: Secondary | ICD-10-CM | POA: Diagnosis not present

## 2024-01-26 LAB — PREGNANCY, URINE: Preg Test, Ur: NEGATIVE

## 2024-01-29 ENCOUNTER — Ambulatory Visit (HOSPITAL_COMMUNITY): Admitting: Anesthesiology

## 2024-01-29 ENCOUNTER — Other Ambulatory Visit: Payer: Self-pay

## 2024-01-29 ENCOUNTER — Ambulatory Visit (HOSPITAL_COMMUNITY)
Admission: RE | Admit: 2024-01-29 | Discharge: 2024-01-29 | Disposition: A | Discharge status: Home / Self Care | Attending: Internal Medicine | Admitting: Internal Medicine

## 2024-01-29 ENCOUNTER — Encounter (HOSPITAL_COMMUNITY): Payer: Self-pay | Admitting: Internal Medicine

## 2024-01-29 ENCOUNTER — Encounter (HOSPITAL_COMMUNITY)
Admission: RE | Disposition: A | Discharge status: Home / Self Care | Payer: Self-pay | Source: Home / Self Care | Attending: Internal Medicine

## 2024-01-29 DIAGNOSIS — Z87891 Personal history of nicotine dependence: Secondary | ICD-10-CM | POA: Insufficient documentation

## 2024-01-29 DIAGNOSIS — D123 Benign neoplasm of transverse colon: Secondary | ICD-10-CM | POA: Diagnosis not present

## 2024-01-29 DIAGNOSIS — R12 Heartburn: Secondary | ICD-10-CM | POA: Diagnosis not present

## 2024-01-29 DIAGNOSIS — K449 Diaphragmatic hernia without obstruction or gangrene: Secondary | ICD-10-CM

## 2024-01-29 DIAGNOSIS — Z1211 Encounter for screening for malignant neoplasm of colon: Secondary | ICD-10-CM | POA: Diagnosis not present

## 2024-01-29 DIAGNOSIS — I10 Essential (primary) hypertension: Secondary | ICD-10-CM | POA: Diagnosis not present

## 2024-01-29 DIAGNOSIS — E119 Type 2 diabetes mellitus without complications: Secondary | ICD-10-CM | POA: Insufficient documentation

## 2024-01-29 DIAGNOSIS — K5730 Diverticulosis of large intestine without perforation or abscess without bleeding: Secondary | ICD-10-CM | POA: Insufficient documentation

## 2024-01-29 DIAGNOSIS — K635 Polyp of colon: Secondary | ICD-10-CM | POA: Insufficient documentation

## 2024-01-29 DIAGNOSIS — K219 Gastro-esophageal reflux disease without esophagitis: Secondary | ICD-10-CM | POA: Diagnosis not present

## 2024-01-29 DIAGNOSIS — Z139 Encounter for screening, unspecified: Secondary | ICD-10-CM | POA: Diagnosis not present

## 2024-01-29 DIAGNOSIS — Z7984 Long term (current) use of oral hypoglycemic drugs: Secondary | ICD-10-CM | POA: Diagnosis not present

## 2024-01-29 DIAGNOSIS — R519 Headache, unspecified: Secondary | ICD-10-CM | POA: Diagnosis not present

## 2024-01-29 DIAGNOSIS — D120 Benign neoplasm of cecum: Secondary | ICD-10-CM

## 2024-01-29 DIAGNOSIS — D125 Benign neoplasm of sigmoid colon: Secondary | ICD-10-CM | POA: Diagnosis not present

## 2024-01-29 DIAGNOSIS — K2289 Other specified disease of esophagus: Secondary | ICD-10-CM

## 2024-01-29 DIAGNOSIS — K6389 Other specified diseases of intestine: Secondary | ICD-10-CM | POA: Diagnosis not present

## 2024-01-29 HISTORY — PX: COLONOSCOPY: SHX5424

## 2024-01-29 HISTORY — PX: ESOPHAGOGASTRODUODENOSCOPY: SHX5428

## 2024-01-29 LAB — GLUCOSE, CAPILLARY
Glucose-Capillary: 87 mg/dL (ref 70–99)
Glucose-Capillary: 88 mg/dL (ref 70–99)

## 2024-01-29 SURGERY — COLONOSCOPY
Anesthesia: General

## 2024-01-29 MED ORDER — PROPOFOL 500 MG/50ML IV EMUL
INTRAVENOUS | Status: DC | PRN
  Administered 2024-01-29: 150 ug/kg/min via INTRAVENOUS

## 2024-01-29 MED ORDER — PROPOFOL 10 MG/ML IV BOLUS
INTRAVENOUS | Status: DC | PRN
  Administered 2024-01-29: 100 mg via INTRAVENOUS

## 2024-01-29 MED ORDER — LIDOCAINE 2% (20 MG/ML) 5 ML SYRINGE
INTRAMUSCULAR | Status: DC | PRN
Start: 2024-01-29 — End: 2024-01-29
  Administered 2024-01-29: 50 mg via INTRAVENOUS

## 2024-01-29 MED ORDER — LACTATED RINGERS IV SOLN: INTRAVENOUS | Status: DC | PRN

## 2024-01-29 NOTE — Anesthesia Postprocedure Evaluation (Signed)
 Anesthesia Post Note  Patient: [name]  Procedure(s) Performed: COLONOSCOPY EGD (ESOPHAGOGASTRODUODENOSCOPY)  Patient location during evaluation: Endoscopy Anesthesia Type: General Level of consciousness: awake and alert Pain management: pain level controlled Vital Signs Assessment: post-procedure vital signs reviewed and stable Respiratory status: spontaneous breathing Cardiovascular status: blood pressure returned to baseline and stable Postop Assessment: no apparent nausea or vomiting Anesthetic complications: no   No notable events documented.   Last Vitals:  Vitals:   01/29/24 1336 01/29/24 1340  BP: (!) 88/64 109/72  Pulse: 79 86  Resp: 20 20  Temp: 36.6 C   SpO2: 95% 97%    Last Pain:  Vitals:   01/29/24 1340  TempSrc:   PainSc: 0-No pain                 Sugar Vanzandt

## 2024-01-29 NOTE — Anesthesia Preprocedure Evaluation (Signed)
 Anesthesia Evaluation  Patient identified by MRN, date of birth, ID band Patient awake    Reviewed: Allergy & Precautions, H&P , NPO status , Patient's Chart, lab work & pertinent test results, reviewed documented beta blocker date and time   History of Anesthesia Complications (+) PONV, POST - OP SPINAL HEADACHE and history of anesthetic complications  Airway Mallampati: II  TM Distance: >3 FB Neck ROM: full    Dental no notable dental hx.    Pulmonary neg pulmonary ROS, former smoker   Pulmonary exam normal breath sounds clear to auscultation       Cardiovascular Exercise Tolerance: Good hypertension, negative cardio ROS  Rhythm:regular Rate:Normal     Neuro/Psych  Headaches  negative psych ROS   GI/Hepatic Neg liver ROS,GERD  ,,  Endo/Other  diabetes    Renal/GU Renal disease  negative genitourinary   Musculoskeletal   Abdominal   Peds  Hematology negative hematology ROS (+)   Anesthesia Other Findings   Reproductive/Obstetrics negative OB ROS                              Anesthesia Physical Anesthesia Plan  ASA: 2  Anesthesia Plan: General   Post-op Pain Management:    Induction:   PONV Risk Score and Plan: Propofol infusion  Airway Management Planned:   Additional Equipment:   Intra-op Plan:   Post-operative Plan:   Informed Consent: I have reviewed the patients History and Physical, chart, labs and discussed the procedure including the risks, benefits and alternatives for the proposed anesthesia with the patient or authorized representative who has indicated his/her understanding and acceptance.     Dental Advisory Given  Plan Discussed with: CRNA  Anesthesia Plan Comments:         Anesthesia Quick Evaluation

## 2024-01-29 NOTE — Op Note (Signed)
 [facility] Patient Name: [name] Procedure Date: 01/29/2024 12:10 PM MRN: [id number] Date of Birth: 06-12-1975 Attending MD: Lamar Ozell Hollingshead , MD, [id number] CSN: [id number] Age: 48 Admit Type: Outpatient Procedure:                Colonoscopy Indications:              Screening for colorectal malignant neoplasm Providers:                Lamar Ozell Hollingshead, MD, Madelin Hunter, RN, Dorcas Lenis, Technician Referring MD:              Medicines:                Propofol per Anesthesia Complications:            No immediate complications. Estimated Blood Loss:     Estimated blood loss was minimal. Procedure:                Pre-Anesthesia Assessment:                           - Prior to the procedure, a History and Physical                            was performed, and patient medications and                            allergies were reviewed. The patient's tolerance of                            previous anesthesia was also reviewed. The risks                            and benefits of the procedure and the sedation                            options and risks were discussed with the patient.                            All questions were answered, and informed consent                            was obtained. Prior Anticoagulants: The patient has                            taken no anticoagulant or antiplatelet agents. ASA                            Grade Assessment: III - A patient with severe                            systemic disease. After reviewing the risks and  benefits, the patient was deemed in satisfactory                            condition to undergo the procedure.                           After obtaining informed consent, the colonoscope                            was passed under direct vision. Throughout the                            procedure, the patient's blood pressure, pulse, and                             oxygen saturations were monitored continuously. The                            CH-HQ190L ([id number]) Colon was introduced through                            the anus and advanced to the the cecum, identified                            by appendiceal orifice and ileocecal valve. The                            colonoscopy was performed without difficulty. The                            patient tolerated the procedure well. The quality                            of the bowel preparation was adequate. The                            ileocecal valve, appendiceal orifice, and rectum                            were photographed. The entire colon was examined. Scope In: 1:13:51 PM Scope Out: 1:30:57 PM Scope Withdrawal Time: 0 hours 10 minutes 22 seconds  Total Procedure Duration: 0 hours 17 minutes 6 seconds  Findings:      The perianal and digital rectal examinations were normal.      Scattered small-mouthed diverticula were found in the sigmoid colon and       descending colon.      A 2 mm polyp was found in the cecum. The polyp was sessile. The polyp       was removed with a cold biopsy forceps. Resection and retrieval were       complete. Estimated blood loss was minimal. Estimated blood loss was       minimal.      A 5 mm polyp was found in the splenic flexure. The polyp was sessile.       The polyp was removed with a  cold snare. Resection and retrieval were       complete. Estimated blood loss was minimal.      A 9 mm polyp was found in the sigmoid colon. The polyp was       semi-pedunculated. The polyp was removed with a hot snare. Resection and       retrieval were complete. Estimated blood loss: none.      The exam was otherwise without abnormality on direct and retroflexion       views. Impression:               - Diverticulosis in the sigmoid colon and in the                            descending colon.                           - One 2 mm polyp in the cecum, removed with a  cold                            biopsy forceps. Resected and retrieved.                           - One 5 mm polyp at the splenic flexure, removed                            with a cold snare. Resected and retrieved.                           - One 9 mm polyp in the sigmoid colon, removed with                            a hot snare. Resected and retrieved.                           - The examination was otherwise normal on direct                            and retroflexion views. Moderate Sedation:      Moderate (conscious) sedation was personally administered by an       anesthesia professional. The following parameters were monitored: oxygen       saturation, heart rate, blood pressure, respiratory rate, EKG, adequacy       of pulmonary ventilation, and response to care. Recommendation:           - Patient has a contact number available for                            emergencies. The signs and symptoms of potential                            delayed complications were discussed with the                            patient. Return to normal activities tomorrow.  Written discharge instructions were provided to the                            patient.                           - Advance diet as tolerated.                           - Continue present medications.                           - Repeat colonoscopy date to be determined after                            pending pathology results are reviewed for                            surveillance.                           - Return to GI office (date not yet determined). Procedure Code(s):        --- Professional ---                           [phone], Colonoscopy, flexible; with removal of                            tumor(s), polyp(s), or other lesion(s) by snare                            technique                           45380, 59, Colonoscopy, flexible; with biopsy,                            single or  multiple Diagnosis Code(s):        --- Professional ---                           Z12.11, Encounter for screening for malignant                            neoplasm of colon                           D12.0, Benign neoplasm of cecum                           D12.3, Benign neoplasm of transverse colon (hepatic                            flexure or splenic flexure)                           D12.5, Benign neoplasm of sigmoid colon  K57.30, Diverticulosis of large intestine without                            perforation or abscess without bleeding CPT copyright 2022 American [employer] Association. All rights reserved. The codes documented in this report are preliminary and upon coder review may  be revised to meet current compliance requirements. Lamar HERO. Loranzo Desha, MD Lamar Ozell Hollingshead, MD 01/29/2024 1:40:10 PM This report has been signed electronically. Number of Addenda: 0

## 2024-01-29 NOTE — Transfer of Care (Signed)
 Immediate Anesthesia Transfer of Care Note  Patient: [name]  Procedure(s) Performed: COLONOSCOPY EGD (ESOPHAGOGASTRODUODENOSCOPY)  Patient Location: PACU and Endoscopy Unit  Anesthesia Type:General  Level of Consciousness: awake  Airway & Oxygen Therapy: Patient Spontanous Breathing  Post-op Assessment: Report given to RN  Post vital signs: Reviewed and stable  Last Vitals:  Vitals Value Taken Time  BP    Temp    Pulse    Resp    SpO2      Last Pain:  Vitals:   01/29/24 1255  TempSrc:   PainSc: 0-No pain      Patients Stated Pain Goal: (P) 4 (01/29/24 1105)  Complications: No notable events documented.

## 2024-01-29 NOTE — Discharge Instructions (Addendum)
 EGD Discharge instructions Please read the instructions outlined below and refer to this sheet in the next few weeks. These discharge instructions provide you with general information on caring for yourself after you leave the hospital. Your doctor may also give you specific instructions. While your treatment has been planned according to the most current medical practices available, unavoidable complications occasionally occur. If you have any problems or questions after discharge, please call your doctor. ACTIVITY You may resume your regular activity but move at a slower pace for the next 24 hours.  Take frequent rest periods for the next 24 hours.  Walking will help expel (get rid of) the air and reduce the bloated feeling in your abdomen.  No driving for 24 hours (because of the anesthesia (medicine) used during the test).  You may shower.  Do not sign any important legal documents or operate any machinery for 24 hours (because of the anesthesia used during the test).  NUTRITION Drink plenty of fluids.  You may resume your normal diet.  Begin with a light meal and progress to your normal diet.  Avoid alcoholic beverages for 24 hours or as instructed by your caregiver.  MEDICATIONS You may resume your normal medications unless your caregiver tells you otherwise.  WHAT YOU CAN EXPECT TODAY You may experience abdominal discomfort such as a feeling of fullness or "gas" pains.  FOLLOW-UP Your doctor will discuss the results of your test with you.  SEEK IMMEDIATE MEDICAL ATTENTION IF ANY OF THE FOLLOWING OCCUR: Excessive nausea (feeling sick to your stomach) and/or vomiting.  Severe abdominal pain and distention (swelling).  Trouble swallowing.  Temperature over 101 F (37.8 C).  Rectal bleeding or vomiting of blood.    Colonoscopy Discharge Instructions  Read the instructions outlined below and refer to this sheet in the next few weeks. These discharge instructions provide you with  general information on caring for yourself after you leave the hospital. Your doctor may also give you specific instructions. While your treatment has been planned according to the most current medical practices available, unavoidable complications occasionally occur. If you have any problems or questions after discharge, call Dr. Shaaron at [phone]. ACTIVITY You may resume your regular activity, but move at a slower pace for the next 24 hours.  Take frequent rest periods for the next 24 hours.  Walking will help get rid of the air and reduce the bloated feeling in your belly (abdomen).  No driving for 24 hours (because of the medicine (anesthesia) used during the test).   Do not sign any important legal documents or operate any machinery for 24 hours (because of the anesthesia used during the test).  NUTRITION Drink plenty of fluids.  You may resume your normal diet as instructed by your doctor.  Begin with a light meal and progress to your normal diet. Heavy or fried foods are harder to digest and may make you feel sick to your stomach (nauseated).  Avoid alcoholic beverages for 24 hours or as instructed.  MEDICATIONS You may resume your normal medications unless your doctor tells you otherwise.  WHAT YOU CAN EXPECT TODAY Some feelings of bloating in the abdomen.  Passage of more gas than usual.  Spotting of blood in your stool or on the toilet paper.  IF YOU HAD POLYPS REMOVED DURING THE COLONOSCOPY: No aspirin products for 7 days or as instructed.  No alcohol for 7 days or as instructed.  Eat a soft diet for the next 24 hours.  FINDING  OUT THE RESULTS OF YOUR TEST Not all test results are available during your visit. If your test results are not back during the visit, make an appointment with your caregiver to find out the results. Do not assume everything is normal if you have not heard from your caregiver or the medical facility. It is important for you to follow up on all of your test  results.  SEEK IMMEDIATE MEDICAL ATTENTION IF: You have more than a spotting of blood in your stool.  Your belly is swollen (abdominal distention).  You are nauseated or vomiting.  You have a temperature over 101.  You have abdominal pain or discomfort that is severe or gets worse throughout the day.      You do not have Barrett's esophagus.  You have a small hiatal hernia otherwise your upper GI tract appeared normal.    Continue taking your Protonix  daily.  GERD information provided   3 polyps found and removed from your colon  You have colonic diverticulosis  Further recommendations to follow pending review of pathology report

## 2024-01-29 NOTE — Interval H&P Note (Signed)
 History and Physical Interval Note:  01/29/2024 12:33 PM  [name]  has presented today for surgery, with the diagnosis of SCREENING, CHRONIC GERD.  The various methods of treatment have been discussed with the patient and family. After consideration of risks, benefits and other options for treatment, the patient has consented to  Procedure(s) with comments: COLONOSCOPY (N/A) - 12:00 pm, ok rm 1-2 EGD (ESOPHAGOGASTRODUODENOSCOPY) (N/A) as a surgical intervention.  The patient's history has been reviewed, patient examined, no change in status, stable for surgery.  I have reviewed the patient's chart and labs.  Questions were answered to the patient's satisfaction.     Lamar Hollingshead    First ever screening colonoscopy.  Longstanding GERD no dysphagia screening EGD and colonoscopy today per plan.The risks, benefits, limitations, imponderables and alternatives regarding both EGD and colonoscopy have been reviewed with the patient. Questions have been answered. All parties agreeable.

## 2024-01-29 NOTE — Op Note (Signed)
 [facility] Patient Name: [name] Procedure Date: 01/29/2024 12:10 PM MRN: [id number] Date of Birth: [birth year] Attending MD: Lamar Ozell Hollingshead , MD, [id number] CSN: [id number] Age: 48 Admit Type: Outpatient Procedure:                Upper GI endoscopy Indications:              Heartburn Providers:                Lamar Ozell Hollingshead, MD, Madelin Hunter, RN, Dorcas Lenis, Technician Referring MD:              Medicines:                Propofol per Anesthesia Complications:            No immediate complications. Estimated Blood Loss:     Estimated blood loss: none. Procedure:                Pre-Anesthesia Assessment:                           - Prior to the procedure, a History and Physical                            was performed, and patient medications and                            allergies were reviewed. The patient's tolerance of                            previous anesthesia was also reviewed. The risks                            and benefits of the procedure and the sedation                            options and risks were discussed with the patient.                            All questions were answered, and informed consent                            was obtained. Prior Anticoagulants: The patient has                            taken no anticoagulant or antiplatelet agents. ASA                            Grade Assessment: III - A patient with severe                            systemic disease. After reviewing the risks and  benefits, the patient was deemed in satisfactory                            condition to undergo the procedure.                           After obtaining informed consent, the endoscope was                            passed under direct vision. Throughout the                            procedure, the patient's blood pressure, pulse, and                            oxygen saturations were  monitored continuously. The                            [id number] ([id number]) Upper was introduced through                            the mouth, and advanced to the second part of                            duodenum. The upper GI endoscopy was accomplished                            without difficulty. The patient tolerated the                            procedure well. Scope In: 1:03:32 PM Scope Out: 1:07:06 PM Total Procedure Duration: 0 hours 3 minutes 34 seconds  Findings:      The examined esophagus was normal. Somewhat of undulating Z-line but no       Barrett's epithelium seen.      A small hiatal hernia was present. Normal-appearing gastric mucosa.       Patent pylorus.      The duodenal bulb and second portion of the duodenum were normal.       Estimated blood loss: none. Impression:               - Normal esophagus.                           - Small hiatal hernia.                           - Normal duodenal bulb and second portion of the                            duodenum.                           - No specimens collected. Moderate Sedation:      Moderate (conscious) sedation was personally administered by an       anesthesia professional. The following parameters were monitored: oxygen  saturation, heart rate, blood pressure, respiratory rate, EKG, adequacy       of pulmonary ventilation, and response to care. Recommendation:           - Patient has a contact number available for                            emergencies. The signs and symptoms of potential                            delayed complications were discussed with the                            patient. Return to normal activities tomorrow.                            Written discharge instructions were provided to the                            patient.                           - Advance diet as tolerated.                           - Continue present medications. Continue Protonix                              40 mg orally daily. Information on GERD provided.                            See colonoscopy report.                           - Return to my office (date not yet determined). Procedure Code(s):        --- Professional ---                           [phone], Esophagogastroduodenoscopy, flexible,                            transoral; diagnostic, including collection of                            specimen(s) by brushing or washing, when performed                            (separate procedure) Diagnosis Code(s):        --- Professional ---                           K44.9, Diaphragmatic hernia without obstruction or                            gangrene  R12, Heartburn CPT copyright 2022 American Medical Association. All rights reserved. The codes documented in this report are preliminary and upon coder review may  be revised to meet current compliance requirements. Lamar HERO. Calton Harshfield, MD Lamar Ozell Hollingshead, MD 01/29/2024 1:11:02 PM This report has been signed electronically. Number of Addenda: 0

## 2024-01-30 ENCOUNTER — Encounter (HOSPITAL_COMMUNITY): Payer: Self-pay | Admitting: Internal Medicine

## 2024-01-30 LAB — SURGICAL PATHOLOGY

## 2024-02-02 ENCOUNTER — Ambulatory Visit: Payer: Self-pay | Admitting: Internal Medicine

## 2024-02-03 ENCOUNTER — Other Ambulatory Visit (HOSPITAL_COMMUNITY): Payer: Self-pay

## 2024-02-03 ENCOUNTER — Other Ambulatory Visit: Payer: Self-pay | Admitting: Gastroenterology

## 2024-02-03 ENCOUNTER — Other Ambulatory Visit: Payer: Self-pay

## 2024-02-03 MED ORDER — MOUNJARO 2.5 MG/0.5ML ~~LOC~~ SOAJ
2.5000 mg | SUBCUTANEOUS | 3 refills | Status: AC
  Filled 2024-02-03: qty 2, 28d supply, fill #0
  Filled 2024-03-15: qty 2, 28d supply, fill #1

## 2024-02-03 MED ORDER — NITROGLYCERIN 0.4 % RE OINT
1.0000 | TOPICAL_OINTMENT | Freq: Two times a day (BID) | RECTAL | 0 refills | Status: AC
  Filled 2024-02-03: qty 30, 15d supply, fill #0

## 2024-02-04 ENCOUNTER — Other Ambulatory Visit (HOSPITAL_COMMUNITY): Payer: Self-pay

## 2024-02-04 ENCOUNTER — Other Ambulatory Visit: Payer: Self-pay

## 2024-02-05 ENCOUNTER — Other Ambulatory Visit: Payer: Self-pay

## 2024-02-10 ENCOUNTER — Other Ambulatory Visit (HOSPITAL_COMMUNITY): Payer: Self-pay

## 2024-02-10 MED ORDER — FLUZONE 0.5 ML IM SUSY
0.5000 mL | PREFILLED_SYRINGE | Freq: Once | INTRAMUSCULAR | 0 refills | Status: AC
  Filled 2024-02-10: qty 0.5, 1d supply, fill #0

## 2024-03-15 ENCOUNTER — Other Ambulatory Visit (HOSPITAL_COMMUNITY): Payer: Self-pay

## 2024-03-30 ENCOUNTER — Telehealth: Payer: Self-pay | Admitting: Cardiology

## 2024-03-30 NOTE — Telephone Encounter (Signed)
°*  STAT* If patient is at the pharmacy, call can be transferred to refill team.   1. Which medications need to be refilled? (please list name of each medication and dose if known)   metoprolol  succinate (TOPROL -XL) 25 MG 24 hr tablet   2. Would you like to learn more about the convenience, safety, & potential cost savings by using the [facility]?   3. Are you open to using the [facility] (Type [facility]. ).  4. Which pharmacy/location (including street and city if local pharmacy) is medication to be sent to?  [facility]   5. Do they need a 30 day or 90 day supply?   90  day  Patient stated she still has some medication.  Patient has appointment scheduled with Dr. Pietro on 3/9.

## 2024-03-31 ENCOUNTER — Other Ambulatory Visit (HOSPITAL_COMMUNITY): Payer: Self-pay

## 2024-03-31 MED ORDER — METOPROLOL SUCCINATE ER 25 MG PO TB24
25.0000 mg | ORAL_TABLET | Freq: Every day | ORAL | 0 refills | Status: AC
  Filled 2024-03-31: qty 90, 90d supply, fill #0

## 2024-03-31 NOTE — Telephone Encounter (Signed)
 Pt scheduled 06/21/24, refill sent.

## 2024-04-20 ENCOUNTER — Other Ambulatory Visit (HOSPITAL_COMMUNITY): Payer: Self-pay

## 2024-04-20 MED ORDER — CYCLOBENZAPRINE HCL 10 MG PO TABS
10.0000 mg | ORAL_TABLET | Freq: Three times a day (TID) | ORAL | 1 refills | Status: AC | PRN
  Filled 2024-04-20: qty 30, 10d supply, fill #0

## 2024-04-20 MED ORDER — PREDNISONE 10 MG PO TABS
ORAL_TABLET | ORAL | 0 refills | Status: AC
  Filled 2024-04-20: qty 21, 6d supply, fill #0

## 2024-04-29 ENCOUNTER — Other Ambulatory Visit (HOSPITAL_COMMUNITY): Payer: Self-pay

## 2024-04-29 MED ORDER — MOUNJARO 5 MG/0.5ML ~~LOC~~ SOAJ
5.0000 mg | SUBCUTANEOUS | 5 refills | Status: AC
  Filled 2024-04-29: qty 2, 28d supply, fill #0

## 2024-06-21 ENCOUNTER — Ambulatory Visit: Admitting: Cardiology
